# Patient Record
Sex: Male | Born: 1970
Health system: Southern US, Community
[De-identification: ages and names within clinical notes are randomized; demographics above are authoritative.]

## PROBLEM LIST (undated history)

## (undated) DIAGNOSIS — S0530XA Ocular laceration without prolapse or loss of intraocular tissue, unspecified eye, initial encounter: Secondary | ICD-10-CM

---

## 2016-12-21 ENCOUNTER — Inpatient Hospital Stay (HOSPITAL_COMMUNITY): Payer: Self-pay

## 2016-12-21 ENCOUNTER — Emergency Department (HOSPITAL_COMMUNITY): Payer: Self-pay

## 2016-12-21 ENCOUNTER — Inpatient Hospital Stay (HOSPITAL_COMMUNITY)
Admission: EM | Admit: 2016-12-21 | Discharge: 2017-01-12 | DRG: 004 | Disposition: A | Discharge status: Rehabilitation Facility | Payer: Self-pay | Attending: Internal Medicine | Admitting: Internal Medicine

## 2016-12-21 DIAGNOSIS — I469 Cardiac arrest, cause unspecified: Secondary | ICD-10-CM | POA: Diagnosis present

## 2016-12-21 DIAGNOSIS — E872 Acidosis: Secondary | ICD-10-CM | POA: Diagnosis present

## 2016-12-21 DIAGNOSIS — I5021 Acute systolic (congestive) heart failure: Secondary | ICD-10-CM

## 2016-12-21 DIAGNOSIS — R739 Hyperglycemia, unspecified: Secondary | ICD-10-CM

## 2016-12-21 DIAGNOSIS — X58XXXA Exposure to other specified factors, initial encounter: Secondary | ICD-10-CM | POA: Diagnosis present

## 2016-12-21 DIAGNOSIS — K7200 Acute and subacute hepatic failure without coma: Secondary | ICD-10-CM | POA: Diagnosis present

## 2016-12-21 DIAGNOSIS — F1010 Alcohol abuse, uncomplicated: Secondary | ICD-10-CM | POA: Diagnosis present

## 2016-12-21 DIAGNOSIS — I639 Cerebral infarction, unspecified: Secondary | ICD-10-CM | POA: Diagnosis not present

## 2016-12-21 DIAGNOSIS — Z6823 Body mass index (BMI) 23.0-23.9, adult: Secondary | ICD-10-CM

## 2016-12-21 DIAGNOSIS — K7290 Hepatic failure, unspecified without coma: Secondary | ICD-10-CM | POA: Diagnosis present

## 2016-12-21 DIAGNOSIS — E162 Hypoglycemia, unspecified: Secondary | ICD-10-CM | POA: Diagnosis present

## 2016-12-21 DIAGNOSIS — I96 Gangrene, not elsewhere classified: Secondary | ICD-10-CM | POA: Diagnosis not present

## 2016-12-21 DIAGNOSIS — K567 Ileus, unspecified: Secondary | ICD-10-CM

## 2016-12-21 DIAGNOSIS — I255 Ischemic cardiomyopathy: Secondary | ICD-10-CM

## 2016-12-21 DIAGNOSIS — J15212 Pneumonia due to Methicillin resistant Staphylococcus aureus: Secondary | ICD-10-CM | POA: Diagnosis present

## 2016-12-21 DIAGNOSIS — T17990A Other foreign object in respiratory tract, part unspecified in causing asphyxiation, initial encounter: Secondary | ICD-10-CM | POA: Diagnosis present

## 2016-12-21 DIAGNOSIS — R339 Retention of urine, unspecified: Secondary | ICD-10-CM | POA: Diagnosis not present

## 2016-12-21 DIAGNOSIS — J9600 Acute respiratory failure, unspecified whether with hypoxia or hypercapnia: Secondary | ICD-10-CM

## 2016-12-21 DIAGNOSIS — R1110 Vomiting, unspecified: Secondary | ICD-10-CM

## 2016-12-21 DIAGNOSIS — N170 Acute kidney failure with tubular necrosis: Secondary | ICD-10-CM | POA: Diagnosis present

## 2016-12-21 DIAGNOSIS — R509 Fever, unspecified: Secondary | ICD-10-CM

## 2016-12-21 DIAGNOSIS — T17928A Food in respiratory tract, part unspecified causing other injury, initial encounter: Secondary | ICD-10-CM

## 2016-12-21 DIAGNOSIS — B1920 Unspecified viral hepatitis C without hepatic coma: Secondary | ICD-10-CM | POA: Diagnosis present

## 2016-12-21 DIAGNOSIS — J9601 Acute respiratory failure with hypoxia: Secondary | ICD-10-CM

## 2016-12-21 DIAGNOSIS — J189 Pneumonia, unspecified organism: Secondary | ICD-10-CM

## 2016-12-21 DIAGNOSIS — Z4659 Encounter for fitting and adjustment of other gastrointestinal appliance and device: Secondary | ICD-10-CM

## 2016-12-21 DIAGNOSIS — G9340 Encephalopathy, unspecified: Secondary | ICD-10-CM

## 2016-12-21 DIAGNOSIS — R6521 Severe sepsis with septic shock: Secondary | ICD-10-CM | POA: Diagnosis present

## 2016-12-21 DIAGNOSIS — Z79899 Other long term (current) drug therapy: Secondary | ICD-10-CM

## 2016-12-21 DIAGNOSIS — A419 Sepsis, unspecified organism: Principal | ICD-10-CM | POA: Diagnosis present

## 2016-12-21 DIAGNOSIS — R0682 Tachypnea, not elsewhere classified: Secondary | ICD-10-CM

## 2016-12-21 DIAGNOSIS — W44F3XA Food entering into or through a natural orifice, initial encounter: Secondary | ICD-10-CM

## 2016-12-21 DIAGNOSIS — J9690 Respiratory failure, unspecified, unspecified whether with hypoxia or hypercapnia: Secondary | ICD-10-CM

## 2016-12-21 DIAGNOSIS — T17920A Food in respiratory tract, part unspecified causing asphyxiation, initial encounter: Secondary | ICD-10-CM

## 2016-12-21 DIAGNOSIS — Z0189 Encounter for other specified special examinations: Secondary | ICD-10-CM

## 2016-12-21 DIAGNOSIS — J1008 Influenza due to other identified influenza virus with other specified pneumonia: Secondary | ICD-10-CM | POA: Diagnosis present

## 2016-12-21 DIAGNOSIS — I428 Other cardiomyopathies: Secondary | ICD-10-CM | POA: Diagnosis present

## 2016-12-21 DIAGNOSIS — L8990 Pressure ulcer of unspecified site, unspecified stage: Secondary | ICD-10-CM | POA: Insufficient documentation

## 2016-12-21 DIAGNOSIS — G931 Anoxic brain damage, not elsewhere classified: Secondary | ICD-10-CM

## 2016-12-21 DIAGNOSIS — Z781 Physical restraint status: Secondary | ICD-10-CM

## 2016-12-21 DIAGNOSIS — Z66 Do not resuscitate: Secondary | ICD-10-CM | POA: Diagnosis not present

## 2016-12-21 DIAGNOSIS — R451 Restlessness and agitation: Secondary | ICD-10-CM

## 2016-12-21 DIAGNOSIS — Z452 Encounter for adjustment and management of vascular access device: Secondary | ICD-10-CM

## 2016-12-21 DIAGNOSIS — D72829 Elevated white blood cell count, unspecified: Secondary | ICD-10-CM

## 2016-12-21 DIAGNOSIS — K56609 Unspecified intestinal obstruction, unspecified as to partial versus complete obstruction: Secondary | ICD-10-CM

## 2016-12-21 DIAGNOSIS — R6510 Systemic inflammatory response syndrome (SIRS) of non-infectious origin without acute organ dysfunction: Secondary | ICD-10-CM

## 2016-12-21 DIAGNOSIS — E870 Hyperosmolality and hypernatremia: Secondary | ICD-10-CM | POA: Diagnosis not present

## 2016-12-21 DIAGNOSIS — J80 Acute respiratory distress syndrome: Secondary | ICD-10-CM | POA: Diagnosis present

## 2016-12-21 DIAGNOSIS — E46 Unspecified protein-calorie malnutrition: Secondary | ICD-10-CM | POA: Diagnosis present

## 2016-12-21 DIAGNOSIS — F1910 Other psychoactive substance abuse, uncomplicated: Secondary | ICD-10-CM

## 2016-12-21 DIAGNOSIS — D6489 Other specified anemias: Secondary | ICD-10-CM | POA: Diagnosis present

## 2016-12-21 DIAGNOSIS — R4020 Unspecified coma: Secondary | ICD-10-CM | POA: Diagnosis not present

## 2016-12-21 DIAGNOSIS — Z9911 Dependence on respirator [ventilator] status: Secondary | ICD-10-CM

## 2016-12-21 DIAGNOSIS — J9602 Acute respiratory failure with hypercapnia: Secondary | ICD-10-CM

## 2016-12-21 DIAGNOSIS — E876 Hypokalemia: Secondary | ICD-10-CM | POA: Diagnosis present

## 2016-12-21 DIAGNOSIS — D6959 Other secondary thrombocytopenia: Secondary | ICD-10-CM | POA: Diagnosis present

## 2016-12-21 DIAGNOSIS — R233 Spontaneous ecchymoses: Secondary | ICD-10-CM | POA: Diagnosis not present

## 2016-12-21 HISTORY — DX: Ocular laceration without prolapse or loss of intraocular tissue, unspecified eye, initial encounter: S05.30XA

## 2016-12-21 LAB — COMPREHENSIVE METABOLIC PANEL
ALT: 282 U/L — ABNORMAL HIGH (ref 17–63)
ANION GAP: 20 — AB (ref 5–15)
AST: 324 U/L — AB (ref 15–41)
Albumin: 3 g/dL — ABNORMAL LOW (ref 3.5–5.0)
Alkaline Phosphatase: 46 U/L (ref 38–126)
BUN: 16 mg/dL (ref 6–20)
CHLORIDE: 100 mmol/L — AB (ref 101–111)
CO2: 21 mmol/L — ABNORMAL LOW (ref 22–32)
Calcium: 7.9 mg/dL — ABNORMAL LOW (ref 8.9–10.3)
Creatinine, Ser: 2.44 mg/dL — ABNORMAL HIGH (ref 0.61–1.24)
GFR, EST AFRICAN AMERICAN: 35 mL/min — AB (ref >60)
GFR, EST NON AFRICAN AMERICAN: 30 mL/min — AB (ref >60)
Glucose, Bld: 115 mg/dL — ABNORMAL HIGH (ref 65–99)
POTASSIUM: 3.8 mmol/L (ref 3.5–5.1)
Sodium: 141 mmol/L (ref 135–145)
Total Bilirubin: 0.4 mg/dL (ref 0.3–1.2)
Total Protein: 5.9 g/dL — ABNORMAL LOW (ref 6.5–8.1)

## 2016-12-21 LAB — ECHOCARDIOGRAM COMPLETE: WEIGHTICAEL: 2973.56 [oz_av]

## 2016-12-21 LAB — I-STAT CHEM 8, ED
BUN: 19 mg/dL (ref 6–20)
CHLORIDE: 100 mmol/L — AB (ref 101–111)
Calcium, Ion: 0.96 mmol/L — ABNORMAL LOW (ref 1.15–1.40)
Creatinine, Ser: 2.1 mg/dL — ABNORMAL HIGH (ref 0.61–1.24)
Glucose, Bld: 110 mg/dL — ABNORMAL HIGH (ref 65–99)
HEMATOCRIT: 53 % — AB (ref 39.0–52.0)
Hemoglobin: 18 g/dL — ABNORMAL HIGH (ref 13.0–17.0)
POTASSIUM: 3.8 mmol/L (ref 3.5–5.1)
SODIUM: 141 mmol/L (ref 135–145)
TCO2: 22 mmol/L (ref 0–100)

## 2016-12-21 LAB — URINALYSIS, ROUTINE W REFLEX MICROSCOPIC
Bilirubin Urine: NEGATIVE
Glucose, UA: NEGATIVE mg/dL
Hgb urine dipstick: NEGATIVE
KETONES UR: NEGATIVE mg/dL
Leukocytes, UA: NEGATIVE
Nitrite: NEGATIVE
PH: 5 (ref 5.0–8.0)
Protein, ur: 100 mg/dL — AB
Specific Gravity, Urine: 1.02 (ref 1.005–1.030)

## 2016-12-21 LAB — ETHANOL

## 2016-12-21 LAB — BASIC METABOLIC PANEL
Anion gap: 9 (ref 5–15)
Anion gap: 12 (ref 5–15)
BUN: 21 mg/dL — ABNORMAL HIGH (ref 6–20)
BUN: 27 mg/dL — ABNORMAL HIGH (ref 6–20)
CALCIUM: 6.9 mg/dL — AB (ref 8.9–10.3)
CALCIUM: 6.6 mg/dL — AB (ref 8.9–10.3)
CHLORIDE: 107 mmol/L (ref 101–111)
CHLORIDE: 108 mmol/L (ref 101–111)
CO2: 24 mmol/L (ref 22–32)
CO2: 17 mmol/L — ABNORMAL LOW (ref 22–32)
CREATININE: 2.27 mg/dL — AB (ref 0.61–1.24)
CREATININE: 1.98 mg/dL — AB (ref 0.61–1.24)
GFR calc non Af Amer: 33 mL/min — ABNORMAL LOW (ref >60)
GFR, EST AFRICAN AMERICAN: 38 mL/min — AB (ref >60)
GFR, EST AFRICAN AMERICAN: 45 mL/min — AB (ref >60)
GFR, EST NON AFRICAN AMERICAN: 39 mL/min — AB (ref >60)
Glucose, Bld: 88 mg/dL (ref 65–99)
Glucose, Bld: 152 mg/dL — ABNORMAL HIGH (ref 65–99)
Potassium: 4.1 mmol/L (ref 3.5–5.1)
Potassium: 3.8 mmol/L (ref 3.5–5.1)
SODIUM: 140 mmol/L (ref 135–145)
SODIUM: 137 mmol/L (ref 135–145)

## 2016-12-21 LAB — RESPIRATORY PANEL BY PCR
ADENOVIRUS-RVPPCR: NOT DETECTED
Bordetella pertussis: NOT DETECTED
CHLAMYDOPHILA PNEUMONIAE-RVPPCR: NOT DETECTED
CORONAVIRUS HKU1-RVPPCR: NOT DETECTED
CORONAVIRUS OC43-RVPPCR: NOT DETECTED
Coronavirus 229E: NOT DETECTED
Coronavirus NL63: NOT DETECTED
INFLUENZA B-RVPPCR: DETECTED — AB
Influenza A: NOT DETECTED
METAPNEUMOVIRUS-RVPPCR: NOT DETECTED
MYCOPLASMA PNEUMONIAE-RVPPCR: NOT DETECTED
Parainfluenza Virus 1: NOT DETECTED
Parainfluenza Virus 2: NOT DETECTED
Parainfluenza Virus 3: NOT DETECTED
Parainfluenza Virus 4: NOT DETECTED
RHINOVIRUS / ENTEROVIRUS - RVPPCR: NOT DETECTED
Respiratory Syncytial Virus: NOT DETECTED

## 2016-12-21 LAB — I-STAT ARTERIAL BLOOD GAS, ED
ACID-BASE DEFICIT: 7 mmol/L — AB (ref 0.0–2.0)
BICARBONATE: 20.5 mmol/L (ref 20.0–28.0)
O2 Saturation: 91 %
PCO2 ART: 48.4 mmHg — AB (ref 32.0–48.0)
PO2 ART: 72 mmHg — AB (ref 83.0–108.0)
Patient temperature: 98
TCO2: 22 mmol/L (ref 0–100)
pH, Arterial: 7.233 — ABNORMAL LOW (ref 7.350–7.450)

## 2016-12-21 LAB — POCT I-STAT 3, ART BLOOD GAS (G3+)
ACID-BASE DEFICIT: 10 mmol/L — AB (ref 0.0–2.0)
Acid-base deficit: 10 mmol/L — ABNORMAL HIGH (ref 0.0–2.0)
Bicarbonate: 19 mmol/L — ABNORMAL LOW (ref 20.0–28.0)
Bicarbonate: 18.9 mmol/L — ABNORMAL LOW (ref 20.0–28.0)
O2 SAT: 91 %
O2 Saturation: 87 %
PCO2 ART: 50.5 mmHg — AB (ref 32.0–48.0)
PH ART: 7.185 — AB (ref 7.350–7.450)
PH ART: 7.193 — AB (ref 7.350–7.450)
TCO2: 21 mmol/L (ref 0–100)
TCO2: 20 mmol/L (ref 0–100)
pCO2 arterial: 48.4 mmHg — ABNORMAL HIGH (ref 32.0–48.0)
pO2, Arterial: 66 mmHg — ABNORMAL LOW (ref 83.0–108.0)
pO2, Arterial: 71 mmHg — ABNORMAL LOW (ref 83.0–108.0)

## 2016-12-21 LAB — CBC WITH DIFFERENTIAL/PLATELET
Basophils Absolute: 0 10*3/uL (ref 0.0–0.1)
Basophils Relative: 0 %
Eosinophils Absolute: 0 10*3/uL (ref 0.0–0.7)
Eosinophils Relative: 1 %
HCT: 51.9 % (ref 39.0–52.0)
Hemoglobin: 16.2 g/dL (ref 13.0–17.0)
LYMPHS ABS: 0.4 10*3/uL — AB (ref 0.7–4.0)
Lymphocytes Relative: 30 %
MCH: 28.3 pg (ref 26.0–34.0)
MCHC: 31.2 g/dL (ref 30.0–36.0)
MCV: 90.7 fL (ref 78.0–100.0)
MONOS PCT: 6 %
Monocytes Absolute: 0.1 10*3/uL (ref 0.1–1.0)
NEUTROS ABS: 0.8 10*3/uL — AB (ref 1.7–7.7)
Neutrophils Relative %: 63 %
PLATELETS: 98 10*3/uL — AB (ref 150–400)
RBC: 5.72 MIL/uL (ref 4.22–5.81)
RDW: 13.3 % (ref 11.5–15.5)
WBC: 1.3 10*3/uL — CL (ref 4.0–10.5)

## 2016-12-21 LAB — I-STAT TROPONIN, ED: TROPONIN I, POC: 0.17 ng/mL — AB (ref 0.00–0.08)

## 2016-12-21 LAB — AMMONIA: Ammonia: 57 umol/L — ABNORMAL HIGH (ref 9–35)

## 2016-12-21 LAB — SALICYLATE LEVEL: SALICYLATE LVL: 8.2 mg/dL (ref 2.8–30.0)

## 2016-12-21 LAB — CBC
HCT: 48.3 % (ref 39.0–52.0)
Hemoglobin: 15.2 g/dL (ref 13.0–17.0)
MCH: 27.7 pg (ref 26.0–34.0)
MCHC: 31.5 g/dL (ref 30.0–36.0)
MCV: 88.1 fL (ref 78.0–100.0)
PLATELETS: 65 10*3/uL — AB (ref 150–400)
RBC: 5.48 MIL/uL (ref 4.22–5.81)
RDW: 13.1 % (ref 11.5–15.5)
WBC: 2 10*3/uL — ABNORMAL LOW (ref 4.0–10.5)

## 2016-12-21 LAB — LACTIC ACID, PLASMA
LACTIC ACID, VENOUS: 8.4 mmol/L — AB (ref 0.5–1.9)
Lactic Acid, Venous: 3.8 mmol/L (ref 0.5–1.9)

## 2016-12-21 LAB — APTT
APTT: 45 s — AB (ref 24–36)
aPTT: 38 seconds — ABNORMAL HIGH (ref 24–36)

## 2016-12-21 LAB — PROTIME-INR
INR: 1.6
INR: 1.53
PROTHROMBIN TIME: 19.2 s — AB (ref 11.4–15.2)
Prothrombin Time: 18.6 seconds — ABNORMAL HIGH (ref 11.4–15.2)

## 2016-12-21 LAB — I-STAT CG4 LACTIC ACID, ED: Lactic Acid, Venous: 9.62 mmol/L (ref 0.5–1.9)

## 2016-12-21 LAB — RAPID URINE DRUG SCREEN, HOSP PERFORMED
Amphetamines: NOT DETECTED
BENZODIAZEPINES: POSITIVE — AB
Barbiturates: NOT DETECTED
COCAINE: NOT DETECTED
OPIATES: POSITIVE — AB
Tetrahydrocannabinol: POSITIVE — AB

## 2016-12-21 LAB — PATHOLOGIST SMEAR REVIEW

## 2016-12-21 LAB — LIPASE, BLOOD: LIPASE: 38 U/L (ref 11–51)

## 2016-12-21 LAB — BRAIN NATRIURETIC PEPTIDE: B Natriuretic Peptide: 57.9 pg/mL (ref 0.0–100.0)

## 2016-12-21 LAB — GLUCOSE, CAPILLARY
GLUCOSE-CAPILLARY: 198 mg/dL — AB (ref 65–99)
GLUCOSE-CAPILLARY: 111 mg/dL — AB (ref 65–99)
Glucose-Capillary: 47 mg/dL — ABNORMAL LOW (ref 65–99)
Glucose-Capillary: 134 mg/dL — ABNORMAL HIGH (ref 65–99)

## 2016-12-21 LAB — TROPONIN I
TROPONIN I: 0.22 ng/mL — AB (ref <0.03)
Troponin I: 0.21 ng/mL (ref <0.03)
Troponin I: 0.18 ng/mL (ref <0.03)

## 2016-12-21 LAB — MRSA PCR SCREENING: MRSA by PCR: NEGATIVE

## 2016-12-21 LAB — ACETAMINOPHEN LEVEL

## 2016-12-21 LAB — PROCALCITONIN: Procalcitonin: 49.6 ng/mL

## 2016-12-21 LAB — CORTISOL: CORTISOL PLASMA: 40.8 ug/dL

## 2016-12-21 MED ORDER — FENTANYL BOLUS VIA INFUSION
50.0000 ug | INTRAVENOUS | Status: DC | PRN
  Filled 2016-12-21: qty 50

## 2016-12-21 MED ORDER — SODIUM CHLORIDE 0.9 % IV BOLUS (SEPSIS)
1000.0000 mL | Freq: Once | INTRAVENOUS | Status: AC
  Administered 2016-12-21: 1000 mL via INTRAVENOUS

## 2016-12-21 MED ORDER — ASPIRIN 300 MG RE SUPP
300.0000 mg | RECTAL | Status: AC
  Administered 2016-12-21: 300 mg via RECTAL
  Filled 2016-12-21: qty 1

## 2016-12-21 MED ORDER — SODIUM CHLORIDE 0.9 % IV SOLN: 100.0000 ug/h | INTRAVENOUS | Status: DC

## 2016-12-21 MED ORDER — VECURONIUM BROMIDE 10 MG IV SOLR
10.0000 mg | INTRAVENOUS | Status: DC | PRN
  Administered 2016-12-21: 10 mg via INTRAVENOUS
  Filled 2016-12-21: qty 10

## 2016-12-21 MED ORDER — THIAMINE HCL 100 MG/ML IJ SOLN
100.0000 mg | Freq: Every day | INTRAMUSCULAR | Status: AC
  Administered 2016-12-21 – 2016-12-24 (×4): 100 mg via INTRAVENOUS
  Filled 2016-12-21 (×4): qty 2

## 2016-12-21 MED ORDER — DEXTROSE 50 % IV SOLN
INTRAVENOUS | Status: AC
  Filled 2016-12-21: qty 50

## 2016-12-21 MED ORDER — SUCCINYLCHOLINE CHLORIDE 20 MG/ML IJ SOLN
INTRAMUSCULAR | Status: AC | PRN
  Administered 2016-12-21: 150 mg via INTRAVENOUS

## 2016-12-21 MED ORDER — NOREPINEPHRINE BITARTRATE 1 MG/ML IV SOLN
0.0000 ug/min | INTRAVENOUS | Status: DC
  Administered 2016-12-21: 10 ug/min via INTRAVENOUS
  Administered 2016-12-21: 21 ug/min via INTRAVENOUS
  Filled 2016-12-21 (×2): qty 4

## 2016-12-21 MED ORDER — VANCOMYCIN HCL 10 G IV SOLR
1250.0000 mg | INTRAVENOUS | Status: DC
  Administered 2016-12-22 – 2016-12-23 (×2): 1250 mg via INTRAVENOUS
  Filled 2016-12-21 (×2): qty 1250

## 2016-12-21 MED ORDER — FOLIC ACID 5 MG/ML IJ SOLN
1.0000 mg | Freq: Every day | INTRAMUSCULAR | Status: AC
  Administered 2016-12-22 – 2016-12-24 (×3): 1 mg via INTRAVENOUS
  Filled 2016-12-21 (×4): qty 0.2

## 2016-12-21 MED ORDER — DEXTROSE 50 % IV SOLN
1.0000 | Freq: Once | INTRAVENOUS | Status: AC
  Administered 2016-12-21: 50 mL via INTRAVENOUS

## 2016-12-21 MED ORDER — MIDAZOLAM HCL 2 MG/2ML IJ SOLN
1.0000 mg | INTRAMUSCULAR | Status: DC | PRN
  Filled 2016-12-21: qty 2

## 2016-12-21 MED ORDER — SODIUM CHLORIDE 0.9 % IV SOLN: INTRAVENOUS | Status: DC

## 2016-12-21 MED ORDER — PROPOFOL 1000 MG/100ML IV EMUL: 25.0000 ug/kg/min | INTRAVENOUS | Status: DC

## 2016-12-21 MED ORDER — SODIUM CHLORIDE 0.9 % IV SOLN
25.0000 ug/h | INTRAVENOUS | Status: DC
  Administered 2016-12-21: 300 ug/h via INTRAVENOUS
  Administered 2016-12-22: 200 ug/h via INTRAVENOUS
  Administered 2016-12-22: 400 ug/h via INTRAVENOUS
  Filled 2016-12-21 (×3): qty 50

## 2016-12-21 MED ORDER — MIDAZOLAM BOLUS VIA INFUSION
4.0000 mg | Freq: Once | INTRAVENOUS | Status: AC
  Administered 2016-12-21: 4 mg via INTRAVENOUS
  Filled 2016-12-21: qty 4

## 2016-12-21 MED ORDER — DEXTROSE-NACL 5-0.9 % IV SOLN
INTRAVENOUS | Status: DC
  Administered 2016-12-21 (×2): via INTRAVENOUS

## 2016-12-21 MED ORDER — SODIUM CHLORIDE 0.9 % IV SOLN
10.0000 ug/h | INTRAVENOUS | Status: DC
  Administered 2016-12-21: 10 ug/h via INTRAVENOUS
  Filled 2016-12-21: qty 50

## 2016-12-21 MED ORDER — ORAL CARE MOUTH RINSE
15.0000 mL | OROMUCOSAL | Status: DC
  Administered 2016-12-21 – 2017-01-08 (×157): 15 mL via OROMUCOSAL

## 2016-12-21 MED ORDER — PANTOPRAZOLE SODIUM 40 MG IV SOLR
40.0000 mg | Freq: Every day | INTRAVENOUS | Status: DC
  Administered 2016-12-21 – 2016-12-28 (×8): 40 mg via INTRAVENOUS
  Filled 2016-12-21 (×8): qty 40

## 2016-12-21 MED ORDER — DEXTROSE 5 % IV SOLN
2.0000 g | INTRAVENOUS | Status: DC
  Administered 2016-12-21 – 2016-12-22 (×2): 2 g via INTRAVENOUS
  Filled 2016-12-21 (×2): qty 2

## 2016-12-21 MED ORDER — MIDAZOLAM HCL 2 MG/2ML IJ SOLN
INTRAMUSCULAR | Status: AC
  Filled 2016-12-21: qty 4

## 2016-12-21 MED ORDER — FENTANYL BOLUS VIA INFUSION
25.0000 ug | INTRAVENOUS | Status: DC | PRN
  Filled 2016-12-21: qty 25

## 2016-12-21 MED ORDER — OSELTAMIVIR PHOSPHATE 6 MG/ML PO SUSR
75.0000 mg | Freq: Every day | ORAL | Status: DC
  Administered 2016-12-21 – 2016-12-22 (×2): 75 mg
  Filled 2016-12-21 (×3): qty 12.5

## 2016-12-21 MED ORDER — CISATRACURIUM BOLUS VIA INFUSION
0.0500 mg/kg | INTRAVENOUS | Status: DC | PRN
  Filled 2016-12-21: qty 5

## 2016-12-21 MED ORDER — MIDAZOLAM HCL 5 MG/ML IJ SOLN
0.0000 mg/h | INTRAMUSCULAR | Status: DC
  Administered 2016-12-21: 2 mg/h via INTRAVENOUS
  Administered 2016-12-22: 6 mg/h via INTRAVENOUS
  Filled 2016-12-21 (×2): qty 20

## 2016-12-21 MED ORDER — ETOMIDATE 2 MG/ML IV SOLN
INTRAVENOUS | Status: AC | PRN
  Administered 2016-12-21: 20 mg via INTRAVENOUS

## 2016-12-21 MED ORDER — VANCOMYCIN HCL IN DEXTROSE 1-5 GM/200ML-% IV SOLN
1000.0000 mg | Freq: Once | INTRAVENOUS | Status: AC
  Administered 2016-12-21: 1000 mg via INTRAVENOUS
  Filled 2016-12-21: qty 200

## 2016-12-21 MED ORDER — MIDAZOLAM HCL 2 MG/2ML IJ SOLN
INTRAMUSCULAR | Status: AC
Start: 2016-12-21 — End: 2016-12-21
  Administered 2016-12-21: 2 mg
  Filled 2016-12-21: qty 4

## 2016-12-21 MED ORDER — CHLORHEXIDINE GLUCONATE 0.12% ORAL RINSE (MEDLINE KIT)
15.0000 mL | Freq: Two times a day (BID) | OROMUCOSAL | Status: DC
  Administered 2016-12-21 – 2017-01-11 (×38): 15 mL via OROMUCOSAL

## 2016-12-21 MED ORDER — NOREPINEPHRINE BITARTRATE 1 MG/ML IV SOLN
0.0000 ug/min | INTRAVENOUS | Status: DC
  Administered 2016-12-21: 18 ug/min via INTRAVENOUS
  Filled 2016-12-21 (×4): qty 16

## 2016-12-21 MED ORDER — VECURONIUM BROMIDE 10 MG IV SOLR
INTRAVENOUS | Status: AC
  Administered 2016-12-21: 10 mg
  Filled 2016-12-21: qty 10

## 2016-12-21 MED ORDER — ASPIRIN 300 MG RE SUPP: 300.0000 mg | RECTAL | Status: DC

## 2016-12-21 MED ORDER — FENTANYL CITRATE (PF) 100 MCG/2ML IJ SOLN: 50.0000 ug | Freq: Once | INTRAMUSCULAR | Status: DC

## 2016-12-21 MED ORDER — MIDAZOLAM HCL 2 MG/2ML IJ SOLN
1.0000 mg | INTRAMUSCULAR | Status: DC | PRN
  Administered 2016-12-21: 1 mg via INTRAVENOUS
  Filled 2016-12-21: qty 2

## 2016-12-21 MED ORDER — PIPERACILLIN-TAZOBACTAM 3.375 G IVPB: 3.3750 g | Freq: Three times a day (TID) | INTRAVENOUS | Status: DC

## 2016-12-21 MED ORDER — MIDAZOLAM HCL 2 MG/2ML IJ SOLN
INTRAMUSCULAR | Status: AC
  Administered 2016-12-21: 4 mg via INTRAVENOUS
  Filled 2016-12-21: qty 4

## 2016-12-21 MED ORDER — CISATRACURIUM BOLUS VIA INFUSION
0.1000 mg/kg | Freq: Once | INTRAVENOUS | Status: DC
  Filled 2016-12-21: qty 10

## 2016-12-21 MED ORDER — ARTIFICIAL TEARS OP OINT: 1.0000 "application " | TOPICAL_OINTMENT | Freq: Three times a day (TID) | OPHTHALMIC | Status: DC

## 2016-12-21 MED ORDER — SODIUM CHLORIDE 0.9 % IV BOLUS (SEPSIS)
500.0000 mL | Freq: Once | INTRAVENOUS | Status: AC
Start: 2016-12-21 — End: 2016-12-21
  Administered 2016-12-21: 500 mL via INTRAVENOUS

## 2016-12-21 MED ORDER — PANTOPRAZOLE SODIUM 40 MG IV SOLR: 40.0000 mg | Freq: Every day | INTRAVENOUS | Status: DC

## 2016-12-21 MED ORDER — PIPERACILLIN-TAZOBACTAM 3.375 G IVPB 30 MIN
3.3750 g | Freq: Once | INTRAVENOUS | Status: AC
  Administered 2016-12-21: 3.375 g via INTRAVENOUS
  Filled 2016-12-21: qty 50

## 2016-12-21 MED ORDER — NOREPINEPHRINE BITARTRATE 1 MG/ML IV SOLN
0.0000 ug/min | INTRAVENOUS | Status: DC
  Filled 2016-12-21: qty 4

## 2016-12-21 MED ORDER — MIDAZOLAM HCL 2 MG/2ML IJ SOLN
4.0000 mg | INTRAMUSCULAR | Status: DC | PRN
  Administered 2016-12-21 (×2): 4 mg via INTRAVENOUS

## 2016-12-21 MED ORDER — NOREPINEPHRINE BITARTRATE 1 MG/ML IV SOLN
0.0000 ug/min | Freq: Once | INTRAVENOUS | Status: AC
  Administered 2016-12-21: 5 ug/min via INTRAVENOUS
  Filled 2016-12-21: qty 4

## 2016-12-21 MED ORDER — SODIUM CHLORIDE 0.9 % IV SOLN
1.0000 ug/kg/min | INTRAVENOUS | Status: DC
  Filled 2016-12-21: qty 20

## 2016-12-21 MED ORDER — PERFLUTREN LIPID MICROSPHERE
INTRAVENOUS | Status: AC
  Administered 2016-12-21: 2 mL
  Filled 2016-12-21: qty 10

## 2016-12-21 MED ORDER — FENTANYL CITRATE (PF) 100 MCG/2ML IJ SOLN
INTRAMUSCULAR | Status: AC
  Filled 2016-12-21: qty 2

## 2016-12-21 NOTE — Progress Notes (Signed)
Pt was brought in by ems unresponsive found by girlfriend EMS brought back came in on NRB mask DR intubated with 7.5 tube .Marland Kitchen Aline placed initial ABG drawn off aline and reported to MD.

## 2016-12-21 NOTE — Progress Notes (Signed)
   12/21/16 0615  Clinical Encounter Type  Visited With Patient and family together  Visit Type ED  Spiritual Encounters  Spiritual Needs Emotional  Stress Factors  Patient Stress Factors Not reviewed  Family Stress Factors Family relationships  Paged that family in consult B. Went with MD when he explained Pt's condition. Pt undergoing procedures and family will be allowed back when he stabilizes or awaiting permission from MD.

## 2016-12-21 NOTE — Procedures (Signed)
ELECTROENCEPHALOGRAM REPORT  Date of Study: 12/21/2016  Patient's Name: Troy Brennan MRN: [id number] Date of Birth: 09-Jul-1971  Referring Provider: Dr. Merrie Roof  Clinical History: This is a 46 year old man with encephalopathy, s/p CPR  Medications: vecuronium (NORCURON) 10 MG injection  cefTRIAXone (ROCEPHIN) 2 g in dextrose 5 % 50 mL IVPB  chlorhexidine gluconate (MEDLINE KIT) (PERIDEX) 0.12 % solution 15 mL  dextrose 5 %-0.9 % sodium chloride infusion  fentaNYL (SUBLIMAZE) 100 MCG/2ML injection  fentaNYL (SUBLIMAZE) 2,500 mcg in sodium chloride 0.9 % 250 mL (10 mcg/mL) infusion  fentaNYL (SUBLIMAZE) bolus via infusion 50 mcg  fentaNYL (SUBLIMAZE) injection 50 mcg  folic acid injection 1 mg  MEDLINE mouth rinse  midazolam (VERSED) 100 mg in sodium chloride 0.9 % 100 mL (1 mg/mL) infusion  midazolam (VERSED) 2 MG/2ML injection  norepinephrine (LEVOPHED) 4 mg in dextrose 5 % 250 mL (0.016 mg/mL) infusion  oseltamivir (TAMIFLU) 6 MG/ML suspension 75 mg  pantoprazole (PROTONIX) injection 40 mg  thiamine (B-1) injection 100 mg  vancomycin (VANCOCIN) 1,250 mg in sodium chloride 0.9 % 250 mL IVPB  vecuronium (NORCURON) injection 10 mg   Technical Summary: A multichannel digital EEG recording measured by the international 10-20 system with electrodes applied with paste and impedances below 5000 ohms performed in our laboratory with EKG monitoring in an intubated and sedated patient.  Hyperventilation and photic stimulation were not performed.  The digital EEG was referentially recorded, reformatted, and digitally filtered in a variety of bipolar and referential montages for optimal display.    Description: The patient is intubated and sedated on Fentanyl and Versed during the recording. The study is obscured by muscle artifact from shivering for the first 10 minutes until vecuronium is given. There is no clear posterior dominant rhythm. The background consists of a  mixture of beta activity with theta and delta slowing. There was no focal slowing seen. No epileptiform discharges or electrographic seizures seen.    EKG lead showed sinus tachycardia.  Impression: This sedated EEG is abnormal due to diffuse slowing of the background.  Clinical Correlation of the above findings indicates diffuse cerebral dysfunction that is nonspecific in etiology and can be seen with hypoxic/ischemic injury, toxic/metabolic encephalopathy, or medication effect from Fentanyl and Versed. There were no electrographic seizures in this study.    Ellouise Newer, M.D.

## 2016-12-21 NOTE — Procedures (Signed)
Central Venous Catheter Insertion Procedure Note Troy Brennan [id number] 1971-08-01  Procedure: Insertion of Central Venous Catheter Indications: Assessment of intravascular volume, Drug and/or fluid administration and Frequent blood sampling  Procedure Details Consent: Unable to obtain consent because of emergent medical necessity. Time Out: Verified patient identification, verified procedure, site/side was marked, verified correct patient position, special equipment/implants available, medications/allergies/relevent history reviewed, required imaging and test results available.  Performed  Maximum sterile technique was used including antiseptics, cap, gloves, gown, hand hygiene, mask and sheet. Skin prep: Chlorhexidine; local anesthetic administered A antimicrobial bonded/coated triple lumen catheter was placed in the left internal jugular vein using the Seldinger technique.  Evaluation Blood flow good Complications: No apparent complications Patient did tolerate procedure well. Chest X-ray ordered to verify placement.  CXR: pending.  Troy Brennan 12/21/2016, 9:34 AM  Note failed attempts by KE NP prior Troy Brennan. Troy Alias, MD, FACP Pgr: [phone] [facility] Troy Brennan

## 2016-12-21 NOTE — Progress Notes (Addendum)
Pharmacy Antibiotic Note  Koray Zappone is a 46 y.o. male admitted on 12/21/2016 with sepsis.  Pharmacy has been consulted for vancomycin and zosyn dosing. Tmax is 100.2 and WBC is low at 1.3 SCr is elevated at 2.44. First doses per EDP.   Plan: Vanc 1250mg  IV Q24H Zosyn 3.375gm IV Q8H (4hr inf) F/u renal fxn, C&S, clinical status and trough at SS  Weight: 221 lb 5.5 oz (100.4 kg)  Temp (24hrs), Avg:100.1 F (37.8 C), Min:100 F (37.8 C), Max:100.2 F (37.9 C)   Recent Labs Lab 12/21/16 0539 12/21/16 0540 12/21/16 0557 12/21/16 0616  WBC  --   --  1.3*  --   CREATININE 2.10*  --  2.44*  --   LATICACIDVEN  --  9.62*  --  8.4*    CrCl cannot be calculated (Unknown ideal weight.).    No Known Allergies  Antimicrobials this admission: Vanc 2/14>> Zosyn 2/14>>  Dose adjustments this admission: N/A  Microbiology results: Pending  Thank you for allowing pharmacy to be a part of this patient's care.  Peter Daquila, Drake Leach 12/21/2016 7:39 AM  Addendum: Changing zosyn to ceftriaxone  Plan: Ceftriaxone 2gm IV Q24H  Lysle Pearl, PharmD, BCPS Pager # [phone] 12/21/2016 7:54 AM

## 2016-12-21 NOTE — ED Notes (Signed)
Per chest xray from 6:20 this morning, OG tube noted to be in right lung, OG tube removed at this time.

## 2016-12-21 NOTE — Progress Notes (Signed)
  Echocardiogram 2D Echocardiogram with Definity has been performed.  Troy Brennan 12/21/2016, 2:20 PM

## 2016-12-21 NOTE — Procedures (Signed)
Arterial Catheter Insertion Procedure Note Troy Brennan [id number] 11/07/1875  Procedure: Insertion of Arterial Catheter  Indications: Blood pressure monitoring  Procedure Details Consent: Unable to obtain consent because of emergent medical necessity. Time Out: Verified patient identification, verified procedure, site/side was marked, verified correct patient position, special equipment/implants available, medications/allergies/relevent history reviewed, required imaging and test results available.  Performed  Maximum sterile technique was used including antiseptics, cap, gloves, gown, hand hygiene, mask and sheet. Skin prep: Chlorhexidine; local anesthetic administered 20 gauge catheter was inserted into right radial artery using the Seldinger technique.  Evaluation Blood flow good; BP tracing good. Complications: No apparent complications.   Troy Brennan [facility] 12/21/2016

## 2016-12-21 NOTE — Care Management Note (Signed)
Case Management Note  Patient Details  Name: Troy Brennan MRN: [id number] Date of Birth: Dec 09, 1970  Subjective/Objective:     Adm w unresponsiveness, vent             Action/Plan: found by girlfirend, sister has been contacted per chart   Expected Discharge Date:                  Expected Discharge Plan:     In-House Referral:     Discharge planning Services  CM Consult  Post Acute Care Choice:    Choice offered to:     DME Arranged:    DME Agency:     HH Arranged:    HH Agency:     Status of Service:  In process, will continue to follow  If discussed at Long Length of Stay Meetings, dates discussed:    Additional Comments: will moniter as pt progresses.  Hanley Hays, RN 12/21/2016, 10:25 AM

## 2016-12-21 NOTE — Progress Notes (Signed)
Bedside EEG completed, results pending. 

## 2016-12-21 NOTE — ED Provider Notes (Signed)
[facility]-EMERGENCY DEPT Provider Note   CSN: [id number] Arrival date & time: 12/21/16  0522     History   Chief Complaint Chief Complaint  Patient presents with  . Cardiac Arrest    HPI Troy Brennan is a 46 y.o. male.  Patient presents to the emergency department for evaluation of unresponsiveness and possible cardiac arrest. Patient reportedly was heard to be struggling with his breathing by his fiance. She reports that she was unable to find pulses and started CPR. First responders continued CPR, by the time EMS arrived on the scene, patient had regained pulses. He has been in sinus tachycardia, struggling with his breathing throughout transportation. He was administered Narcan without any significant improvement.  Level V Caveat due to acuity      No past medical history on file.  There are no active problems to display for this patient.   No past surgical history on file.     Home Medications    Prior to Admission medications   Not on File    Family History No family history on file.  Social History Social History  Substance Use Topics  . Smoking status: Not on file  . Smokeless tobacco: Not on file  . Alcohol use Not on file     Allergies   Patient has no known allergies.   Review of Systems Review of Systems  Unable to perform ROS: Acuity of condition     Physical Exam Updated Vital Signs BP (!) 85/58   Pulse (!) 57   Temp 100.2 F (37.9 C) (Rectal)   Resp 26   Wt 221 lb 5.5 oz (100.4 kg)   SpO2 (!) 85%   Physical Exam  Constitutional: He appears well-developed. He appears lethargic.  HENT:  Head: Atraumatic.  Eyes:  Left eye chronic post-op changes R pupil small, sluggish  Neck: Neck supple.  Cardiovascular: Regular rhythm.  Tachycardia present.   Pulmonary/Chest: Accessory muscle usage present. Tachypnea noted. He is in respiratory distress. He has decreased breath sounds. He has rhonchi. He has rales.  Abdominal: Soft.   Musculoskeletal: He exhibits no edema.  Neurological: He appears lethargic. GCS eye subscore is 4. GCS verbal subscore is 1. GCS motor subscore is 4.  Skin: Skin is warm and dry.     ED Treatments / Results  Labs (all labs ordered are listed, but only abnormal results are displayed) Labs Reviewed  CBC WITH DIFFERENTIAL/PLATELET - Abnormal; Notable for the following:       Result Value   WBC 1.3 (*)    Platelets 98 (*)    Neutro Abs 0.8 (*)    Lymphs Abs 0.4 (*)    All other components within normal limits  COMPREHENSIVE METABOLIC PANEL - Abnormal; Notable for the following:    Chloride 100 (*)    CO2 21 (*)    Glucose, Bld 115 (*)    Creatinine, Ser 2.44 (*)    Calcium 7.9 (*)    Total Protein 5.9 (*)    Albumin 3.0 (*)    ALT 282 (*)    GFR calc non Af Amer 30 (*)    GFR calc Af Amer 35 (*)    Anion gap 20 (*)    All other components within normal limits  ACETAMINOPHEN LEVEL - Abnormal; Notable for the following:    Acetaminophen (Tylenol), Serum <10 (*)    All other components within normal limits  AMMONIA - Abnormal; Notable for the following:    Ammonia 57 (*)  All other components within normal limits  I-STAT CHEM 8, ED - Abnormal; Notable for the following:    Chloride 100 (*)    Creatinine, Ser 2.10 (*)    Glucose, Bld 110 (*)    Calcium, Ion 0.96 (*)    Hemoglobin 18.0 (*)    HCT 53.0 (*)    All other components within normal limits  I-STAT CG4 LACTIC ACID, ED - Abnormal; Notable for the following:    Lactic Acid, Venous 9.62 (*)    All other components within normal limits  I-STAT TROPOININ, ED - Abnormal; Notable for the following:    Troponin i, poc 0.17 (*)    All other components within normal limits  I-STAT ARTERIAL BLOOD GAS, ED - Abnormal; Notable for the following:    pH, Arterial 7.233 (*)    pCO2 arterial 48.4 (*)    pO2, Arterial 72.0 (*)    Acid-base deficit 7.0 (*)    All other components within normal limits  CULTURE, BLOOD (ROUTINE X  2)  CULTURE, BLOOD (ROUTINE X 2)  URINE CULTURE  LIPASE, BLOOD  ETHANOL  SALICYLATE LEVEL  LACTIC ACID, PLASMA  RAPID URINE DRUG SCREEN, HOSP PERFORMED  URINALYSIS, ROUTINE W REFLEX MICROSCOPIC  BRAIN NATRIURETIC PEPTIDE  I-STAT CG4 LACTIC ACID, ED    EKG  EKG Interpretation  Date/Time:  Wednesday December 21 2016 05:26:33 EST Ventricular Rate:  127 PR Interval:    QRS Duration: 78 QT Interval:  292 QTC Calculation: 425 R Axis:   78 Text Interpretation:  Sinus tachycardia Consider right atrial enlargement Confirmed by Blinda Leatherwood  MD, CHRISTOPHER [phone]) on 12/21/2016 6:08:59 AM       Radiology Dg Chest Port 1 View  Result Date: 12/21/2016 CLINICAL DATA:  Cardiac arrest EXAM: PORTABLE CHEST 1 VIEW COMPARISON:  None. FINDINGS: An endotracheal tube is present with tip measuring about 4.3 cm above the carina. An enteric tube has been placed. The tip is in a right lower lobe bronchus and needs to be repositioned. Normal heart size. Diffuse patchy airspace disease throughout the lungs in a perihilar distribution with peripheral sparing. This may represent edema, pneumonia, or hemorrhagic consolidation. Aspiration could also have this appearance in the appropriate setting. Prominent stomach gas. IMPRESSION: Endotracheal tube appears in satisfactory position. Enteric tube is in a right lower lobe bronchus and needs to be repositioned. Diffuse bilateral airspace infiltrates in a mostly perihilar distribution. These results were called by telephone at the time of interpretation on 12/21/2016 at 6:17 am to Dr. Jaci Carrel , who verbally acknowledged these results. Electronically Signed   By: Burman Nieves M.D.   On: 12/21/2016 06:18    Procedures Procedure Name: Intubation Date/Time: 12/21/2016 7:09 AM Performed by: Gilda Crease Pre-anesthesia Checklist: Patient identified, Emergency Drugs available, Suction available, Timeout performed and Patient being monitored Oxygen  Delivery Method: Ambu bag Preoxygenation: Pre-oxygenation with 100% oxygen Intubation Type: Rapid sequence Ventilation: Mask ventilation without difficulty Laryngoscope Size: Glidescope and 4 Tube size: 7.5 mm Number of attempts: 3 (2 attempts made by student and physician assistant were unsuccessful. Patient bagged between attempts.) Placement Confirmation: ETT inserted through vocal cords under direct vision Tube secured with: ETT holder Difficulty Due To: Difficult Airway- due to large tongue, Difficult Airway- due to reduced neck mobility and Difficult Airway- due to limited oral opening      (including critical care time)  Medications Ordered in ED Medications  fentaNYL (SUBLIMAZE) 100 MCG/2ML injection (not administered)  fentaNYL (SUBLIMAZE) 2,500 mcg in sodium  chloride 0.9 % 250 mL (10 mcg/mL) infusion (50 mcg/hr Intravenous Rate/Dose Change 12/21/16 0623)  sodium chloride 0.9 % bolus 1,000 mL (1,000 mLs Intravenous New Bag/Given 12/21/16 0636)    And  sodium chloride 0.9 % bolus 1,000 mL (1,000 mLs Intravenous New Bag/Given 12/21/16 0609)    And  sodium chloride 0.9 % bolus 500 mL (500 mLs Intravenous New Bag/Given 12/21/16 0700)  vancomycin (VANCOCIN) IVPB 1000 mg/200 mL premix (1,000 mg Intravenous New Bag/Given 12/21/16 0646)  midazolam (VERSED) injection 4 mg (4 mg Intravenous Given 12/21/16 0617)  midazolam (VERSED) 2 MG/2ML injection (2 mg  Given 12/21/16 0604)  succinylcholine (ANECTINE) injection (150 mg Intravenous Given 12/21/16 0600)  etomidate (AMIDATE) injection (20 mg Intravenous Given 12/21/16 0540)  piperacillin-tazobactam (ZOSYN) IVPB 3.375 g (0 g Intravenous Stopped 12/21/16 0648)  norepinephrine (LEVOPHED) 4 mg in dextrose 5 % 250 mL (0.016 mg/mL) infusion (10 mcg/min Intravenous Rate/Dose Change 12/21/16 0704)     Initial Impression / Assessment and Plan / ED Course  I have reviewed the triage vital signs and the nursing notes.  Pertinent labs & imaging results  that were available during my care of the patient were reviewed by me and considered in my medical decision making (see chart for details).     Patient reportedly was noted to be having significant difficulty breathing by his significant other while sleeping. She was unable to wake him and then reported that she cannot find pulses. Another family member initiated CPR which was continued by first responders. EMS report return of spontaneous circulation by the time they arrived on scene.  Patient was exhibiting agonal respirations and was not alert at arrival. He had no significant gag reflex, corneal reflex was absent. Patient was therefore intubated.  Patient noted to be febrile. Empiric treatment for sepsis initiated. There was difficulty ascertaining his actual blood pressure, arterial line was placed. At this point it was noted that he was mildly hypotensive, will initiate peripheral Levaphed, admitted to ICU.  CRITICAL CARE Performed by: Gilda Crease   Total critical care time: 40 minutes  Critical care time was exclusive of separately billable procedures and treating other patients.  Critical care was necessary to treat or prevent imminent or life-threatening deterioration.  Critical care was time spent personally by me on the following activities: development of treatment plan with patient and/or surrogate as well as nursing, discussions with consultants, evaluation of patient's response to treatment, examination of patient, obtaining history from patient or surrogate, ordering and performing treatments and interventions, ordering and review of laboratory studies, ordering and review of radiographic studies, pulse oximetry and re-evaluation of patient's condition.   Final Clinical Impressions(s) / ED Diagnoses   Final diagnoses:  Acute respiratory failure with hypoxia (HCC)  Community acquired pneumonia, unspecified laterality    New Prescriptions New Prescriptions    No medications on file     Gilda Crease, MD 12/21/16 [phone]

## 2016-12-21 NOTE — Progress Notes (Signed)
eLink Physician-Brief Progress Note Patient Name: Troy Brennan DOB: 1971/01/30 MRN: [id number]   Date of Service  12/21/2016  HPI/Events of Note  46 yo with acute resp distress Gurgling in sleep CPR started by brother approx 10-15 mins Intubated, art line placed, LA>10, ARF with B/L opacities on CXR  eICU Interventions  Admit to ICU, vent support, Vasopressors support Recommend Check ECHO and check CT head ARDS net protocol empiric abx therapy     Intervention Category Evaluation Type: New Patient Evaluation  [name] 12/21/2016, 6:50 AM

## 2016-12-21 NOTE — H&P (Signed)
PULMONARY / CRITICAL CARE MEDICINE   Name: Troy Brennan MRN: [id number] DOB: October 14, 1971    ADMISSION DATE:  12/21/2016 CONSULTATION DATE:  12/21/2016  REFERRING MD:  Dr. Blinda Leatherwood   CHIEF COMPLAINT: Cardiac Arrest  HISTORY OF PRESENT ILLNESS: Information obtained by medical staff and family as patient is encephalopathic  46 year old male with ETOH and polysubstance abuse, recently discharged from detox facility 8-10 days ago. Family is unaware of further medical history. Girlfriend reports that she has only been with patient for short-period of time and but denies that he has used drug or ETOH since discharge from rehab. For the last 3 days patient has experienced flu like symptoms with full body aches, cough, and fever. This morning girlfriend found patient apneic and pulseless, per [facility] narcan was administered without improvement,  And then underwent multiple rounds of CPR for 10-15 minutes with return of ROSC. Upon arrival to ED patient was non-responsive and was intubated for airway protection. PCCM consulted to admit.   PAST MEDICAL HISTORY :  He  has no past medical history on file.  PAST SURGICAL HISTORY: He  has no past surgical history on file.  No Known Allergies  No current facility-administered medications on file prior to encounter.    No current outpatient prescriptions on file prior to encounter.    FAMILY HISTORY:  His has no family status information on file.    SOCIAL HISTORY: He    REVIEW OF SYSTEMS:   Unable to review as patient is sedated and intubated   SUBJECTIVE:  Intubated, on levophed and fentanyl gtt.   VITAL SIGNS: BP 102/75   Pulse (!) 57   Temp 100.2 F (37.9 C) (Rectal)   Resp 22   Wt 100.4 kg (221 lb 5.5 oz)   SpO2 (!) 85%   HEMODYNAMICS:    VENTILATOR SETTINGS: Vent Mode: PRVC FiO2 (%):  [100 %] 100 % Set Rate:  [18 bmp] 18 bmp Vt Set:  [500 mL] 500 mL PEEP:  [13 cmH20] 13 cmH20  INTAKE / OUTPUT: I/O last 3  completed shifts: In: 27 [IV Piggyback:50] Out: 110 [Urine:110]  PHYSICAL EXAMINATION: General: Adult male, no distress, lying in bed   Neuro: Sedated, moves right arm non-purposefully HEENT: ETT in place, left eye chronic post-op changes    Cardiovascular: Tachy, NI S1/S2, no MRG   Lungs: Rhonchi throughout, on vent  Abdomen: obese, soft, active bowel sounds  Musculoskeletal: no deformities  Skin: modled to lower extremities, dry, intact     LABS:  BMET  Recent Labs Lab 12/21/16 0539 12/21/16 0557  NA 141 141  K 3.8 3.8  CL 100* 100*  CO2  --  21*  BUN 19 16  CREATININE 2.10* 2.44*  GLUCOSE 110* 115*    Electrolytes  Recent Labs Lab 12/21/16 0557  CALCIUM 7.9*    CBC  Recent Labs Lab 12/21/16 0539 12/21/16 0557  WBC  --  1.3*  HGB 18.0* 16.2  HCT 53.0* 51.9  PLT  --  98*    Coag's No results for input(s): APTT, INR in the last 168 hours.  Sepsis Markers  Recent Labs Lab 12/21/16 0540 12/21/16 0616  LATICACIDVEN 9.62* 8.4*    ABG  Recent Labs Lab 12/21/16 0632  PHART 7.233*  PCO2ART 48.4*  PO2ART 72.0*    Liver Enzymes  Recent Labs Lab 12/21/16 0557  AST PENDING  ALT 282*  ALKPHOS 46  BILITOT 0.4  ALBUMIN 3.0*    Cardiac Enzymes No  results for input(s): TROPONINI, PROBNP in the last 168 hours.  Glucose No results for input(s): GLUCAP in the last 168 hours.  Imaging Dg Chest Port 1 View  Result Date: 12/21/2016 CLINICAL DATA:  Cardiac arrest EXAM: PORTABLE CHEST 1 VIEW COMPARISON:  None. FINDINGS: An endotracheal tube is present with tip measuring about 4.3 cm above the carina. An enteric tube has been placed. The tip is in a right lower lobe bronchus and needs to be repositioned. Normal heart size. Diffuse patchy airspace disease throughout the lungs in a perihilar distribution with peripheral sparing. This may represent edema, pneumonia, or hemorrhagic consolidation. Aspiration could also have this appearance in the  appropriate setting. Prominent stomach gas. IMPRESSION: Endotracheal tube appears in satisfactory position. Enteric tube is in a right lower lobe bronchus and needs to be repositioned. Diffuse bilateral airspace infiltrates in a mostly perihilar distribution. These results were called by telephone at the time of interpretation on 12/21/2016 at 6:17 am to Dr. Jaci Carrel , who verbally acknowledged these results. Electronically Signed   By: Burman Nieves M.D.   On: 12/21/2016 06:18     STUDIES:  CXR 2/14 > Diffuse bilateral airspace infiltrates, ? Edema, PNA, or Hemorrhagic Consolidation   CT Head 2/14 >   CULTURES: Blood 2/14 > Sputum 2/14 > Urine 2/14 >  RVP 2/14 >  ANTIBIOTICS: Vancomycin 2/14 > Rocephin 2/14 > Tamiflu 2/14 >   SIGNIFICANT EVENTS: 2/14 > Presents to ED after being found pulseless/apneic at home   LINES/TUBES: ETT 2/14 >>   DISCUSSION: 46 year old male with known ETOH and polysubstance abuse. Discharged from 8-10 days ago from Detox facility. For the last 3 days has had fever, chills, cough, and weakness, this morning girlfriend found him unresponsive and apneic. [facility] report 5-10 minutes of CPR before ROSC. ?Aspiration event vs CAP.    ASSESSMENT / PLAN:  PULMONARY A: Acute Respiratory Failure secondary to possible aspiration vs PNA  P:   Full Vent Support PAD protocol  RVP Panel pending  Rapid Flu pending  Trend CXR   CARDIOVASCULAR A:  Cardiac Arrest  Septic Shock Hypotension  P:  Cardiac Monitoring  Wean Levophed to Maintain MAP >70 Will need Central Line Trend Trop  Normothermia protocol to 37  RENAL A:   Acute Kidney Injury  Lactic Acidosis  P:   Trend BMP  Hydrate d5NS @100  ml/hr   GASTROINTESTINAL A:   No issues  P:   NPO PPI   HEMATOLOGIC A:   Leukopenia  P:  Trend CBC  Hold SQ Heparin until CT Head completed   INFECTIOUS A:   Aspiration vs CAP P:   Trend fever and WBC  Trend Lactic acid and  Procal  PAN culture  Tamiflu  Vancomycin and Rocephin   ENDOCRINE A:   Hypoglycemia  P:   Glucose checks q1H per hypothermia  Cortisol Pending   NEUROLOGIC A:   Metabolic Encephalopathy   H/O ETOH and polysubstance abuse  >Ethyl level <5 P:  CT Head  EEG   Folic acid and thiamine  Urine drug screen processing  RASS goal: 0/-1    FAMILY  - Updates: Girlfriend updated at bedside, sister updated on phone.   - Inter-disciplinary family meet or Palliative Care meeting due by: 2/21    Jovita Kussmaul, AG-ACNP [facility]  Pgr: [phone]  PCCM Pgr: [phone]  STAFF NOTE: Cindi Carbon, MD FACP have personally reviewed patient's available data, including medical history, events  of note, physical examination and test results as part of my evaluation. I have discussed with resident/NP and other care providers such as pharmacist, RN and RRT. In addition, I personally evaluated patient and elicited key findings of: not awake, not fc, ronchi bilateral moderate diffuse insp and exp, no crepitus, no sig edema, pcxr c/w diffuse int infiltrates, s/p arrest, etiology r/o flu /ards vs drug OD with aspiration, would peruse TTM normothermia but not hypothermia in setting septic shock conerns, levophed to MAp goal 65, place line, get cvp, follow lactic, get echo to r/o pulm edema caused by cardiomyaotphy (etoh), treat vanc, ctx, tamiflu, keep plat less 30 , place aline , no role nimbex for now, limit sedation as able, get eeg, CT head, rate increase vent, correct ph as able, to 7 cc/kg, repeat abg, concern sepsis with leukopenia noted, follow cbc with diff further, iso9late, no fa ily at bedside The patient is critically ill with multiple organ systems failure and requires high complexity decision making for assessment and support, frequent evaluation and titration of therapies, application of advanced monitoring technologies and extensive interpretation of  multiple databases.   Critical Care Time devoted to patient care services described in this note is 90 Minutes. This time reflects time of care of this signee: Rory Percy, MD FACP. This critical care time does not reflect procedure time, or teaching time or supervisory time of PA/NP/Med student/Med Resident etc but could involve care discussion time. Rest per NP/medical resident whose note is outlined above and that I agree with   Mcarthur Rossetti. Tyson Alias, MD, FACP Pgr: [phone] [facility] 12/21/2016 9:42 AM

## 2016-12-21 NOTE — ED Notes (Signed)
Ice packs placed on patient as CCM at bedside deciding to cool patient.

## 2016-12-21 NOTE — ED Notes (Signed)
Patient presents to ed via [facility] states girlfriend woke up to patient with snoring type resp. Called ems , FD arrived to find patient pulseless and apneic  FD gave patient 2mg  Narcan nasal spray . Upon ems arrival patient regained pulses at 0435 CBG was 52 was given D10 250cc repeat CBG was 217. Upon arrival to ED patient was being bagged with nasal airway in left nares. Patient was unresp. Lungs with bilateral rhonci.

## 2016-12-22 ENCOUNTER — Inpatient Hospital Stay (HOSPITAL_COMMUNITY): Payer: Self-pay

## 2016-12-22 DIAGNOSIS — J189 Pneumonia, unspecified organism: Secondary | ICD-10-CM

## 2016-12-22 DIAGNOSIS — Z9911 Dependence on respirator [ventilator] status: Secondary | ICD-10-CM

## 2016-12-22 DIAGNOSIS — Z452 Encounter for adjustment and management of vascular access device: Secondary | ICD-10-CM

## 2016-12-22 LAB — BASIC METABOLIC PANEL
ANION GAP: 14 (ref 5–15)
BUN: 30 mg/dL — ABNORMAL HIGH (ref 6–20)
CALCIUM: 6.6 mg/dL — AB (ref 8.9–10.3)
CHLORIDE: 108 mmol/L (ref 101–111)
CO2: 17 mmol/L — AB (ref 22–32)
Creatinine, Ser: 2.01 mg/dL — ABNORMAL HIGH (ref 0.61–1.24)
GFR calc non Af Amer: 38 mL/min — ABNORMAL LOW (ref >60)
GFR, EST AFRICAN AMERICAN: 44 mL/min — AB (ref >60)
Glucose, Bld: 102 mg/dL — ABNORMAL HIGH (ref 65–99)
Potassium: 4.7 mmol/L (ref 3.5–5.1)
Sodium: 139 mmol/L (ref 135–145)

## 2016-12-22 LAB — CBC
HCT: 46.6 % (ref 39.0–52.0)
Hemoglobin: 15 g/dL (ref 13.0–17.0)
MCH: 28.1 pg (ref 26.0–34.0)
MCHC: 32.2 g/dL (ref 30.0–36.0)
MCV: 87.4 fL (ref 78.0–100.0)
PLATELETS: 65 10*3/uL — AB (ref 150–400)
RBC: 5.33 MIL/uL (ref 4.22–5.81)
RDW: 13.2 % (ref 11.5–15.5)
WBC: 6.3 10*3/uL (ref 4.0–10.5)

## 2016-12-22 LAB — POCT I-STAT 3, ART BLOOD GAS (G3+)
ACID-BASE DEFICIT: 9 mmol/L — AB (ref 0.0–2.0)
ACID-BASE DEFICIT: 7 mmol/L — AB (ref 0.0–2.0)
ACID-BASE DEFICIT: 9 mmol/L — AB (ref 0.0–2.0)
ACID-BASE DEFICIT: 5 mmol/L — AB (ref 0.0–2.0)
Acid-base deficit: 7 mmol/L — ABNORMAL HIGH (ref 0.0–2.0)
Acid-base deficit: 7 mmol/L — ABNORMAL HIGH (ref 0.0–2.0)
Acid-base deficit: 7 mmol/L — ABNORMAL HIGH (ref 0.0–2.0)
Acid-base deficit: 7 mmol/L — ABNORMAL HIGH (ref 0.0–2.0)
Acid-base deficit: 6 mmol/L — ABNORMAL HIGH (ref 0.0–2.0)
BICARBONATE: 20 mmol/L (ref 20.0–28.0)
BICARBONATE: 18.1 mmol/L — AB (ref 20.0–28.0)
BICARBONATE: 18.1 mmol/L — AB (ref 20.0–28.0)
BICARBONATE: 20 mmol/L (ref 20.0–28.0)
BICARBONATE: 19.3 mmol/L — AB (ref 20.0–28.0)
Bicarbonate: 22.7 mmol/L (ref 20.0–28.0)
Bicarbonate: 18.4 mmol/L — ABNORMAL LOW (ref 20.0–28.0)
Bicarbonate: 17.2 mmol/L — ABNORMAL LOW (ref 20.0–28.0)
Bicarbonate: 18.1 mmol/L — ABNORMAL LOW (ref 20.0–28.0)
O2 SAT: 100 %
O2 SAT: 97 %
O2 SAT: 99 %
O2 Saturation: 98 %
O2 Saturation: 90 %
O2 Saturation: 99 %
O2 Saturation: 98 %
O2 Saturation: 99 %
O2 Saturation: 98 %
PCO2 ART: 34.2 mmHg (ref 32.0–48.0)
PCO2 ART: 34.9 mmHg (ref 32.0–48.0)
PCO2 ART: 35.5 mmHg (ref 32.0–48.0)
PCO2 ART: 37.2 mmHg (ref 32.0–48.0)
PH ART: 7.143 — AB (ref 7.350–7.450)
PH ART: 7.331 — AB (ref 7.350–7.450)
PH ART: 7.322 — AB (ref 7.350–7.450)
PH ART: 7.317 — AB (ref 7.350–7.450)
PH ART: 7.331 — AB (ref 7.350–7.450)
PO2 ART: 120 mmHg — AB (ref 83.0–108.0)
PO2 ART: 135 mmHg — AB (ref 83.0–108.0)
PO2 ART: 117 mmHg — AB (ref 83.0–108.0)
Patient temperature: 98.6
TCO2: 25 mmol/L (ref 0–100)
TCO2: 22 mmol/L (ref 0–100)
TCO2: 19 mmol/L (ref 0–100)
TCO2: 18 mmol/L (ref 0–100)
TCO2: 19 mmol/L (ref 0–100)
TCO2: 19 mmol/L (ref 0–100)
TCO2: 19 mmol/L (ref 0–100)
TCO2: 21 mmol/L (ref 0–100)
TCO2: 20 mmol/L (ref 0–100)
pCO2 arterial: 66.3 mmHg (ref 32.0–48.0)
pCO2 arterial: 55 mmHg — ABNORMAL HIGH (ref 32.0–48.0)
pCO2 arterial: 35 mmHg (ref 32.0–48.0)
pCO2 arterial: 36.1 mmHg (ref 32.0–48.0)
pCO2 arterial: 36.6 mmHg (ref 32.0–48.0)
pH, Arterial: 7.169 — CL (ref 7.350–7.450)
pH, Arterial: 7.329 — ABNORMAL LOW (ref 7.350–7.450)
pH, Arterial: 7.286 — ABNORMAL LOW (ref 7.350–7.450)
pH, Arterial: 7.338 — ABNORMAL LOW (ref 7.350–7.450)
pO2, Arterial: 139 mmHg — ABNORMAL HIGH (ref 83.0–108.0)
pO2, Arterial: 75 mmHg — ABNORMAL LOW (ref 83.0–108.0)
pO2, Arterial: 276 mmHg — ABNORMAL HIGH (ref 83.0–108.0)
pO2, Arterial: 102 mmHg (ref 83.0–108.0)
pO2, Arterial: 124 mmHg — ABNORMAL HIGH (ref 83.0–108.0)
pO2, Arterial: 131 mmHg — ABNORMAL HIGH (ref 83.0–108.0)

## 2016-12-22 LAB — PHOSPHORUS: PHOSPHORUS: 5.3 mg/dL — AB (ref 2.5–4.6)

## 2016-12-22 LAB — PROCALCITONIN: PROCALCITONIN: 144.5 ng/mL

## 2016-12-22 LAB — HEPATITIS PANEL, ACUTE
HCV Ab: >11 s/co ratio — ABNORMAL HIGH (ref 0.0–0.9)
HEP B S AG: NEGATIVE
Hep A IgM: NEGATIVE
Hep B C IgM: NEGATIVE

## 2016-12-22 LAB — URINE CULTURE: Culture: NO GROWTH

## 2016-12-22 LAB — TROPONIN I: TROPONIN I: 0.18 ng/mL — AB (ref <0.03)

## 2016-12-22 LAB — HIV ANTIBODY (ROUTINE TESTING W REFLEX): HIV Screen 4th Generation wRfx: NONREACTIVE

## 2016-12-22 LAB — GLUCOSE, CAPILLARY
GLUCOSE-CAPILLARY: 105 mg/dL — AB (ref 65–99)
GLUCOSE-CAPILLARY: 121 mg/dL — AB (ref 65–99)
Glucose-Capillary: 92 mg/dL (ref 65–99)
Glucose-Capillary: 81 mg/dL (ref 65–99)
Glucose-Capillary: 78 mg/dL (ref 65–99)
Glucose-Capillary: 104 mg/dL — ABNORMAL HIGH (ref 65–99)

## 2016-12-22 LAB — MAGNESIUM: MAGNESIUM: 1.6 mg/dL — AB (ref 1.7–2.4)

## 2016-12-22 MED ORDER — FENTANYL CITRATE (PF) 100 MCG/2ML IJ SOLN
100.0000 ug | Freq: Once | INTRAMUSCULAR | Status: AC
  Administered 2016-12-22: 100 ug via INTRAVENOUS

## 2016-12-22 MED ORDER — MIDAZOLAM HCL 2 MG/2ML IJ SOLN: 2.0000 mg | Freq: Once | INTRAMUSCULAR | Status: AC | PRN

## 2016-12-22 MED ORDER — CHLORHEXIDINE GLUCONATE CLOTH 2 % EX PADS
6.0000 | MEDICATED_PAD | Freq: Every day | CUTANEOUS | Status: DC
  Administered 2016-12-22 – 2016-12-29 (×8): 6 via TOPICAL

## 2016-12-22 MED ORDER — MIDAZOLAM BOLUS VIA INFUSION
2.0000 mg | INTRAVENOUS | Status: DC | PRN
Start: 2016-12-22 — End: 2016-12-26
  Administered 2016-12-22 – 2016-12-23 (×2): 2 mg via INTRAVENOUS
  Filled 2016-12-22: qty 2

## 2016-12-22 MED ORDER — MIDAZOLAM HCL 2 MG/2ML IJ SOLN
2.0000 mg | Freq: Once | INTRAMUSCULAR | Status: AC
  Administered 2016-12-22: 2 mg via INTRAVENOUS

## 2016-12-22 MED ORDER — METHYLPREDNISOLONE SODIUM SUCC 40 MG IJ SOLR
40.0000 mg | Freq: Two times a day (BID) | INTRAMUSCULAR | Status: DC
  Administered 2016-12-22 – 2016-12-25 (×7): 40 mg via INTRAVENOUS
  Filled 2016-12-22 (×7): qty 1

## 2016-12-22 MED ORDER — FENTANYL CITRATE (PF) 2500 MCG/50ML IJ SOLN
100.0000 ug/h | INTRAMUSCULAR | Status: DC
  Administered 2016-12-23 (×3): 50 ug/h via INTRAVENOUS
  Administered 2016-12-23: 400 ug/h via INTRAVENOUS
  Administered 2016-12-23: 300 ug/h via INTRAVENOUS
  Administered 2016-12-23: 400 ug/h via INTRAVENOUS
  Administered 2016-12-24 (×2): 50 ug/h via INTRAVENOUS
  Administered 2016-12-24: 400 ug/h via INTRAVENOUS
  Administered 2016-12-24: 200 ug/h via INTRAVENOUS
  Administered 2016-12-24: 50 ug/h via INTRAVENOUS
  Administered 2016-12-25: 100 ug/h via INTRAVENOUS
  Administered 2016-12-25: 50 ug/h via INTRAVENOUS
  Administered 2016-12-25: 100 ug/h via INTRAVENOUS
  Administered 2016-12-25 (×2): 50 ug/h via INTRAVENOUS
  Administered 2016-12-25: 375 ug/h via INTRAVENOUS
  Administered 2016-12-25: 50 ug/h via INTRAVENOUS
  Administered 2016-12-25: 150 ug/h via INTRAVENOUS
  Administered 2016-12-25 (×3): 50 ug/h via INTRAVENOUS
  Administered 2016-12-25: 100 ug/h via INTRAVENOUS
  Administered 2016-12-25: 50 ug/h via INTRAVENOUS
  Administered 2016-12-25: 200 ug/h via INTRAVENOUS
  Administered 2016-12-26: 400 ug/h via INTRAVENOUS
  Administered 2016-12-26: 300 ug/h via INTRAVENOUS
  Administered 2016-12-26: 250 ug/h via INTRAVENOUS
  Administered 2016-12-27 – 2016-12-28 (×5): 300 ug/h via INTRAVENOUS
  Administered 2016-12-29: 250 ug/h via INTRAVENOUS
  Administered 2016-12-29 (×2): 50 ug/h via INTRAVENOUS
  Administered 2016-12-29: 300 ug/h via INTRAVENOUS
  Administered 2016-12-30 (×2): 50 ug/h via INTRAVENOUS
  Administered 2016-12-30: 125 ug/h via INTRAVENOUS
  Administered 2016-12-30: 100 ug/h via INTRAVENOUS
  Administered 2016-12-30 (×2): 50 ug/h via INTRAVENOUS
  Filled 2016-12-22 (×23): qty 50

## 2016-12-22 MED ORDER — ARTIFICIAL TEARS OP OINT
1.0000 "application " | TOPICAL_OINTMENT | Freq: Three times a day (TID) | OPHTHALMIC | Status: DC
  Administered 2016-12-22 – 2016-12-23 (×5): 1 via OPHTHALMIC
  Filled 2016-12-22: qty 3.5

## 2016-12-22 MED ORDER — SODIUM BICARBONATE 8.4 % IV SOLN
INTRAVENOUS | Status: DC
  Administered 2016-12-22 – 2016-12-23 (×2): via INTRAVENOUS
  Filled 2016-12-22 (×4): qty 150

## 2016-12-22 MED ORDER — SODIUM CHLORIDE 0.9 % IV SOLN
2.0000 mg/h | INTRAVENOUS | Status: DC
  Administered 2016-12-22 – 2016-12-23 (×2): 10 mg/h via INTRAVENOUS
  Filled 2016-12-22 (×3): qty 10

## 2016-12-22 MED ORDER — SODIUM CHLORIDE 0.9% FLUSH
10.0000 mL | Freq: Two times a day (BID) | INTRAVENOUS | Status: DC
  Administered 2016-12-22 – 2016-12-29 (×15): 10 mL

## 2016-12-22 MED ORDER — FENTANYL BOLUS VIA INFUSION
50.0000 ug | INTRAVENOUS | Status: DC | PRN
  Administered 2016-12-25 – 2016-12-31 (×9): 50 ug via INTRAVENOUS
  Filled 2016-12-22: qty 50

## 2016-12-22 MED ORDER — DEXTROSE 5 % IV SOLN
1.0000 g | Freq: Three times a day (TID) | INTRAVENOUS | Status: DC
  Administered 2016-12-22 – 2016-12-29 (×22): 1 g via INTRAVENOUS
  Filled 2016-12-22 (×23): qty 1

## 2016-12-22 MED ORDER — SODIUM CHLORIDE 0.9 % IV SOLN
0.0300 [IU]/min | INTRAVENOUS | Status: DC
  Administered 2016-12-22: 0.03 [IU]/min via INTRAVENOUS
  Filled 2016-12-22 (×2): qty 2

## 2016-12-22 MED ORDER — FENTANYL CITRATE (PF) 100 MCG/2ML IJ SOLN: 100.0000 ug | Freq: Once | INTRAMUSCULAR | Status: DC | PRN

## 2016-12-22 MED ORDER — SODIUM CHLORIDE 0.9% FLUSH: 10.0000 mL | INTRAVENOUS | Status: DC | PRN

## 2016-12-22 MED ORDER — SODIUM CHLORIDE 0.9 % IV SOLN
3.0000 ug/kg/min | INTRAVENOUS | Status: DC
  Administered 2016-12-22: 3 ug/kg/min via INTRAVENOUS
  Filled 2016-12-22 (×3): qty 20

## 2016-12-22 MED ORDER — CISATRACURIUM BOLUS VIA INFUSION
0.1000 mg/kg | Freq: Once | INTRAVENOUS | Status: AC
  Administered 2016-12-22: 8.4 mg via INTRAVENOUS
  Filled 2016-12-22: qty 9

## 2016-12-22 NOTE — Progress Notes (Signed)
Initial Nutrition Assessment   INTERVENTION:   Recommend: Vital AF 1.2 @ 70 ml/hr (1680 ml/day) Provides: 2016 kcal, 126 grams protein, and 1362 ml H2O.   NUTRITION DIAGNOSIS:   Inadequate oral intake related to inability to eat as evidenced by NPO status.  GOAL:   Patient will meet greater than or equal to 90% of their needs  MONITOR:   I & O's, Vent status  REASON FOR ASSESSMENT:   Ventilator    ASSESSMENT:   Pt with PMH of ETOH and polysubstance abuse, recently discharged from detox facility 8-10 days ago. Found in cardiac arrest, septic, normothermia, ards.  2/15 Cortrak placed, tip in 4th portion of duodenum, proned at 1305 Patient is currently intubated on ventilator support MV: 15.4 L/min Temp (24hrs), Avg:96.9 F (36.1 C), Min:94.8 F (34.9 C), Max:98.4 F (36.9 C)  Medications reviewed and include: solumedrol, folic acid, thiamine Labs reviewed: PO4: 5.3, magnesium 1.6 Nutrition-Focused physical exam completed. Findings are no fat depletion, no muscle depletion, and no edema.     Diet Order:  Diet NPO time specified  Skin:  Reviewed, no issues  Last BM:  2/14  Height:   Ht Readings from Last 1 Encounters:  No data found for Ht    Weight:   Wt Readings from Last 1 Encounters:  12/21/16 185 lb 13.6 oz (84.3 kg)    Ideal Body Weight:     BMI:  There is no height or weight on file to calculate BMI.  Estimated Nutritional Needs:   Kcal:  2095  Protein:  125-140 grams  Fluid:  > 2L/day  EDUCATION NEEDS:   No education needs identified at this time  Kendell Bane RD, LDN, CNSC [phone] Pager [phone] After Hours Pager

## 2016-12-22 NOTE — Progress Notes (Signed)
1305 patient proned

## 2016-12-22 NOTE — Progress Notes (Signed)
RN called MD Tyson Alias. Pt's o2 probe not reading accurately (ie reading 70-80% and ABG at 1430 resulted with o2 of 100%). RN to repeat ABG q hour to verify proper oxygenation. Also notified MD that RN is unable to perform TOF monitoring due to head placement while prone. RN had been testing TOF on eyebrow prior to proning with positive 2/4 twitches on 1 of nimbex. RN  is unable to test on pt's wrist because no nerve stimulation or twitching resulted at the beginning of testing, prior to starting any medications. MD said it was ok to stop TOF testing but to try to maintain a BIS between 20-40 if possible. MD gave verbal orders to go up to 400 of fentanyl and 10 of versed, but not to titrate the nimbex any further because proper TOF was obtained at current rate of nimbex. MD changed ventilator settings to 50% fio2, 12 peep, 30 rate, RN made respiratory aware of these changes. Will continue to monitor closely.

## 2016-12-22 NOTE — Progress Notes (Signed)
Responded to spiritual care consult to visit with patient. Patient is unavailable.  Staff  Performing procedure at time of my visit.c  Chaplain available as needed. Venida Jarvis, [place], pager [phone]

## 2016-12-22 NOTE — Progress Notes (Addendum)
PULMONARY / CRITICAL CARE MEDICINE   Name: Troy Brennan MRN: [id number] DOB: 1971-02-21    ADMISSION DATE:  12/21/2016 CONSULTATION DATE:  12/21/2016  REFERRING MD:  Dr. Blinda Leatherwood   CHIEF COMPLAINT: Cardiac Arrest  HISTORY OF PRESENT ILLNESS:  46 year old male with ETOH and polysubstance abuse, recently discharged from detox facility 8-10 days ago. Found in cardiac arrest, septic, normothermia, ards.  SUBJECTIVE:  Levo at 21, pct up, needed higher MV, peep increased  VITAL SIGNS: BP (!) 100/58   Pulse (!) 103   Temp (!) 96.8 F (36 C)   Resp (!) 30   Wt 84.3 kg (185 lb 13.6 oz)   SpO2 100%   HEMODYNAMICS: CVP:  [8 mmHg-15 mmHg] 15 mmHg  VENTILATOR SETTINGS: Vent Mode: PRVC FiO2 (%):  [100 %] 100 % Set Rate:  [18 bmp-30 bmp] 30 bmp Vt Set:  [500 mL-550 mL] 550 mL PEEP:  [10 cmH20-15 cmH20] 15 cmH20 Plateau Pressure:  [28 cmH20-33 cmH20] 33 cmH20  INTAKE / OUTPUT: I/O last 3 completed shifts: In: 4216.1 [I.V.:3836.1; NG/GT:30; IV Piggyback:350] Out: 1270 [Urine:1270]  PHYSICAL EXAMINATION: General: Adult male, not paralyzed, sedated Neuro: Sedated, moving all ext non purposeful, WD pain, sluggish pupil rt, chronic left HEENT: ETT in place, left eye chronic Cardiovascular: S1/S2, tachy no MRG   Lungs: coarse Abdomen: obese, soft, lower bowel sounds  Musculoskeletal: no deformities  Skin: modled to lower extremities livido, dry, intact , has pulses lowers    LABS:  BMET  Recent Labs Lab 12/21/16 0914 12/21/16 2030 12/22/16 0420  NA 140 137 139  K 4.1 3.8 4.7  CL 107 108 108  CO2 24 17* 17*  BUN 21* 27* 30*  CREATININE 2.27* 1.98* 2.01*  GLUCOSE 88 152* 102*    Electrolytes  Recent Labs Lab 12/21/16 0914 12/21/16 2030 12/22/16 0420  CALCIUM 6.9* 6.6* 6.6*  MG  --   --  1.6*  PHOS  --   --  5.3*    CBC  Recent Labs Lab 12/21/16 0557 12/21/16 1408 12/22/16 0420  WBC 1.3* 2.0* 6.3  HGB 16.2 15.2 15.0  HCT 51.9 48.3 46.6  PLT  98* 65* 65*    Coag's  Recent Labs Lab 12/21/16 0914 12/21/16 1848  APTT 38* 45*  INR 1.60 1.53    Sepsis Markers  Recent Labs Lab 12/21/16 0540 12/21/16 0616 12/21/16 0914 12/22/16 0420  LATICACIDVEN 9.62* 8.4* 3.8*  --   PROCALCITON  --   --  49.60 144.50    ABG  Recent Labs Lab 12/21/16 0632 12/21/16 1131 12/21/16 1304  PHART 7.233* 7.185* 7.193*  PCO2ART 48.4* 50.5* 48.4*  PO2ART 72.0* 66.0* 71.0*    Liver Enzymes  Recent Labs Lab 12/21/16 0557  AST 324*  ALT 282*  ALKPHOS 46  BILITOT 0.4  ALBUMIN 3.0*    Cardiac Enzymes  Recent Labs Lab 12/21/16 1408 12/21/16 2030 12/22/16 0414  TROPONINI 0.21* 0.18* 0.18*    Glucose  Recent Labs Lab 12/21/16 1302 12/21/16 1625 12/21/16 2032 12/22/16 0052 12/22/16 0423 12/22/16 0832  GLUCAP 198* 111* 134* 105* 92 81    Imaging Ct Head Wo Contrast  Result Date: 12/21/2016 CLINICAL DATA:  Altered mental status, cardiac arrest, resuscitation, substance abuse, alcohol abuse EXAM: CT HEAD WITHOUT CONTRAST TECHNIQUE: Contiguous axial images were obtained from the base of the skull through the vertex without intravenous contrast. COMPARISON:  None available FINDINGS: Brain: No evidence of acute infarction, hemorrhage, hydrocephalus, extra-axial collection or mass lesion/mass effect.  Vascular: No hyperdense vessel or unexpected calcification. Skull: Normal. Negative for fracture or focal lesion. Sinuses/Orbits: No acute finding. Other: None. IMPRESSION: Normal head CT without contrast Electronically Signed   By: Judie Petit.  Shick M.D.   On: 12/21/2016 08:58   Dg Chest Port 1 View  Result Date: 12/22/2016 CLINICAL DATA:  Respiratory failure, cardiac arrest. Intubated patient. EXAM: PORTABLE CHEST 1 VIEW COMPARISON:  21 December 2016 portable chest x-ray. FINDINGS: The lungs are well-expanded. Confluent alveolar opacities bilaterally have become slightly less conspicuous and less confluent. There is no significant  pleural effusion. There is no pneumothorax. The heart is normal in size. The pulmonary vascularity is less engorged and more distinct. The endotracheal tube tip lies 3.5 cm above the carina. The esophagogastric tube tip and proximal port project in the gastric cardia. The left internal jugular venous catheter tip projects over the junction of middle and distal thirds of the SVC. IMPRESSION: Improved appearance of the pulmonary interstitium with decreased confluence of the alveolar opacities bilaterally. Decreased pulmonary vascular congestion. The support tubes are in reasonable position. Electronically Signed   By: David  [place] M.D.   On: 12/22/2016 07:17   Dg Chest Port 1 View  Result Date: 12/21/2016 CLINICAL DATA:  Central catheter placed EXAM: PORTABLE CHEST 1 VIEW COMPARISON:  December 21, 2016 study obtained earlier in the day FINDINGS: Central catheter tip is in the left innominate vein. Endotracheal tube tip is 2.3 cm above the carina. Nasogastric tube tip and side port in stomach. No pneumothorax. There is interstitial and alveolar opacity in a perihilar distribution, stable from earlier in the day. Heart size normal. Mild pulmonary venous hypertension. No adenopathy evident. No bone lesions. IMPRESSION: Tube and catheter positions as described without pneumothorax. Perihilar interstitial alveolar opacity. Suspect noncardiogenic pulmonary edema, although hemorrhage or atypical pneumonia could present in this manner. Stable cardiac silhouette. Electronically Signed   By: Bretta Bang III M.D.   On: 12/21/2016 10:36     STUDIES:  CXR 2/14 > Diffuse bilateral airspace infiltrates, ? Edema, PNA, or Hemorrhagic Consolidation   CT Head 2/14 > neg  CULTURES: Blood 2/14 > Sputum 2/14 > Urine 2/14 >  RVP 2/14 >POS flu B  ANTIBIOTICS: Vancomycin 2/14 >>> Rocephin 2/14 >>> Tamiflu 2/14 >>>   SIGNIFICANT EVENTS: 2/14 > Presents to ED after being found pulseless/apneic at home    LINES/TUBES: ETT 2/14 >>  2/14 left IJ>>> 2 /14 a line rt rad>>>  DISCUSSION: 46 year old male with known ETOH and polysubstance abuse. Discharged from 8-10 days ago from Detox facility. For the last 3 days has had fever, chills, cough, and weakness, this morning girlfriend found him unresponsive and apneic. First Responders report 5-10 minutes of CPR before ROSC. ?Aspiration event vs CAP.    ASSESSMENT / PLAN:  PULMONARY A: Acute Respiratory Failure Flu pneumonitis ARDS Likely super infection bacterial PNA P:   ARDS protocol, keep plat less 30  ABG now and follow up needed Currently requiring rate 30 and peep 15, likely to need PCV attempts I would like to assess abg and then likely meets crietria for prone 3 liters pos balance, cvp 12-15, now reduce pos balance See ID Avoid acidosis of ph less 7.20 with anoxia concerns, some permissive hypercapnia okay factt Add steroids for pneumonitis concerns Treat nonag / ards with bicarb  CARDIOVASCULAR A:  Cardiac Arrest  Septic Shock P:  cvp 12-15, kvo Levophed ot map goals Add vaso May need athos angiotensin 2 infusion if available No  role cortisol as we are giving steroids as above anyway  RENAL A:   Acute Kidney Injury , ATN P:   Dc current fluids Add bicarb cvp 12, kvo all else  GASTROINTESTINAL A:   ARDS P:   NPO PPI  Place post pyloric, for feeding with prone position LFT follow up  HEMATOLOGIC A:   Leukopenia improved P:  Trend CBC in am  Hold SQ Heparin with plat trend scd  INFECTIOUS A:   Flu B pneumonitis with presumed bacterial PNA Aspiration? P:   PCT triple, broaden, CTX to ceftaz Keep vanc Keep tamiflu , dose adjusted for renal failure, plan 10 days  ENDOCRINE A:   Hypoglycemia improved P:   d5 in fluids TF soon as able Cortisol 40, no need stress steroids after flu pneumonitis roids reduced  NEUROLOGIC A:   S/p arrest, at risk anoxia Metabolic Encephalopathy   H/O ETOH  and polysubstance abuse  >Ethyl level <5 P:  eeg without focus Ct head neg Maintain normothermia x 72 hours in total May need paralysis due to ards vent needs   FAMILY  - Updates: need social work to get me a Management consultant  - Inter-disciplinary family meet or Palliative Care meeting due by: 2/21  Ccm time 85 min  Mcarthur Rossetti. Tyson Alias, MD, FACP Pgr: [phone] [facility] 12/22/2016 8:36 AM    ABg repeated, peep to 12 , rate is maxed For prone x 16 hours  Mcarthur Rossetti. Tyson Alias, MD, FACP Pgr: [phone] [facility]

## 2016-12-23 ENCOUNTER — Inpatient Hospital Stay (HOSPITAL_COMMUNITY): Payer: Self-pay

## 2016-12-23 DIAGNOSIS — J9600 Acute respiratory failure, unspecified whether with hypoxia or hypercapnia: Secondary | ICD-10-CM

## 2016-12-23 LAB — POCT I-STAT 3, ART BLOOD GAS (G3+)
ACID-BASE DEFICIT: 6 mmol/L — AB (ref 0.0–2.0)
ACID-BASE DEFICIT: 6 mmol/L — AB (ref 0.0–2.0)
ACID-BASE DEFICIT: 5 mmol/L — AB (ref 0.0–2.0)
ACID-BASE DEFICIT: 4 mmol/L — AB (ref 0.0–2.0)
ACID-BASE EXCESS: 2 mmol/L (ref 0.0–2.0)
ACID-BASE EXCESS: 2 mmol/L (ref 0.0–2.0)
Acid-base deficit: 1 mmol/L (ref 0.0–2.0)
Acid-base deficit: 3 mmol/L — ABNORMAL HIGH (ref 0.0–2.0)
Acid-base deficit: 3 mmol/L — ABNORMAL HIGH (ref 0.0–2.0)
Acid-base deficit: 4 mmol/L — ABNORMAL HIGH (ref 0.0–2.0)
Acid-base deficit: 5 mmol/L — ABNORMAL HIGH (ref 0.0–2.0)
Acid-base deficit: 6 mmol/L — ABNORMAL HIGH (ref 0.0–2.0)
BICARBONATE: 19.2 mmol/L — AB (ref 20.0–28.0)
BICARBONATE: 19.8 mmol/L — AB (ref 20.0–28.0)
BICARBONATE: 23.4 mmol/L (ref 20.0–28.0)
BICARBONATE: 19.8 mmol/L — AB (ref 20.0–28.0)
BICARBONATE: 20.5 mmol/L (ref 20.0–28.0)
BICARBONATE: 20.9 mmol/L (ref 20.0–28.0)
BICARBONATE: 21.3 mmol/L (ref 20.0–28.0)
BICARBONATE: 31.5 mmol/L — AB (ref 20.0–28.0)
Bicarbonate: 18.8 mmol/L — ABNORMAL LOW (ref 20.0–28.0)
Bicarbonate: 19.3 mmol/L — ABNORMAL LOW (ref 20.0–28.0)
Bicarbonate: 21.3 mmol/L (ref 20.0–28.0)
Bicarbonate: 28.7 mmol/L — ABNORMAL HIGH (ref 20.0–28.0)
O2 SAT: 99 %
O2 SAT: 100 %
O2 SAT: 99 %
O2 SAT: 99 %
O2 SAT: 99 %
O2 SAT: 99 %
O2 SAT: 99 %
O2 Saturation: 93 %
O2 Saturation: 96 %
O2 Saturation: 99 %
O2 Saturation: 99 %
O2 Saturation: 99 %
PCO2 ART: 35.1 mmHg (ref 32.0–48.0)
PCO2 ART: 35.4 mmHg (ref 32.0–48.0)
PCO2 ART: 35.7 mmHg (ref 32.0–48.0)
PCO2 ART: 35.8 mmHg (ref 32.0–48.0)
PCO2 ART: 37.9 mmHg (ref 32.0–48.0)
PCO2 ART: 56.1 mmHg — AB (ref 32.0–48.0)
PH ART: 7.257 — AB (ref 7.350–7.450)
PH ART: 7.323 — AB (ref 7.350–7.450)
PH ART: 7.339 — AB (ref 7.350–7.450)
PH ART: 7.353 (ref 7.350–7.450)
PH ART: 7.363 (ref 7.350–7.450)
PH ART: 7.395 (ref 7.350–7.450)
PH ART: 7.398 (ref 7.350–7.450)
PO2 ART: 132 mmHg — AB (ref 83.0–108.0)
PO2 ART: 137 mmHg — AB (ref 83.0–108.0)
PO2 ART: 174 mmHg — AB (ref 83.0–108.0)
PO2 ART: 150 mmHg — AB (ref 83.0–108.0)
PO2 ART: 145 mmHg — AB (ref 83.0–108.0)
PO2 ART: 159 mmHg — AB (ref 83.0–108.0)
PO2 ART: 77 mmHg — AB (ref 83.0–108.0)
PO2 ART: 100 mmHg (ref 83.0–108.0)
Patient temperature: 36.7
Patient temperature: 37.1
Patient temperature: 37.3
Patient temperature: 37.3
Patient temperature: 37.8
TCO2: 20 mmol/L (ref 0–100)
TCO2: 20 mmol/L (ref 0–100)
TCO2: 20 mmol/L (ref 0–100)
TCO2: 21 mmol/L (ref 0–100)
TCO2: 21 mmol/L (ref 0–100)
TCO2: 22 mmol/L (ref 0–100)
TCO2: 22 mmol/L (ref 0–100)
TCO2: 22 mmol/L (ref 0–100)
TCO2: 22 mmol/L (ref 0–100)
TCO2: 24 mmol/L (ref 0–100)
TCO2: 30 mmol/L (ref 0–100)
TCO2: 34 mmol/L (ref 0–100)
pCO2 arterial: 37 mmHg (ref 32.0–48.0)
pCO2 arterial: 34.7 mmHg (ref 32.0–48.0)
pCO2 arterial: 35.4 mmHg (ref 32.0–48.0)
pCO2 arterial: 36.6 mmHg (ref 32.0–48.0)
pCO2 arterial: 34.8 mmHg (ref 32.0–48.0)
pCO2 arterial: 71.1 mmHg (ref 32.0–48.0)
pH, Arterial: 7.341 — ABNORMAL LOW (ref 7.350–7.450)
pH, Arterial: 7.357 (ref 7.350–7.450)
pH, Arterial: 7.369 (ref 7.350–7.450)
pH, Arterial: 7.392 (ref 7.350–7.450)
pH, Arterial: 7.321 — ABNORMAL LOW (ref 7.350–7.450)
pO2, Arterial: 130 mmHg — ABNORMAL HIGH (ref 83.0–108.0)
pO2, Arterial: 131 mmHg — ABNORMAL HIGH (ref 83.0–108.0)
pO2, Arterial: 134 mmHg — ABNORMAL HIGH (ref 83.0–108.0)
pO2, Arterial: 162 mmHg — ABNORMAL HIGH (ref 83.0–108.0)

## 2016-12-23 LAB — BLOOD GAS, ARTERIAL
ACID-BASE EXCESS: 0.4 mmol/L (ref 0.0–2.0)
BICARBONATE: 25 mmol/L (ref 20.0–28.0)
DRAWN BY: 23588
FIO2: 0.5
O2 SAT: 96.3 %
PCO2 ART: 44.2 mmHg (ref 32.0–48.0)
PEEP/CPAP: 8 cmH2O
PH ART: 7.372 (ref 7.350–7.450)
Patient temperature: 98.6
RATE: 30 resp/min
VT: 550 mL
pO2, Arterial: 94.9 mmHg (ref 83.0–108.0)

## 2016-12-23 LAB — POCT I-STAT 3, VENOUS BLOOD GAS (G3P V)
ACID-BASE DEFICIT: 1 mmol/L (ref 0.0–2.0)
BICARBONATE: 25.1 mmol/L (ref 20.0–28.0)
O2 Saturation: 72 %
PO2 VEN: 39 mmHg (ref 32.0–45.0)
Patient temperature: 36.4
TCO2: 27 mmol/L (ref 0–100)
pCO2, Ven: 45.5 mmHg (ref 44.0–60.0)
pH, Ven: 7.348 (ref 7.250–7.430)

## 2016-12-23 LAB — COMPREHENSIVE METABOLIC PANEL
ALT: 1199 U/L — AB (ref 17–63)
ANION GAP: 13 (ref 5–15)
AST: 1082 U/L — ABNORMAL HIGH (ref 15–41)
Albumin: 2.2 g/dL — ABNORMAL LOW (ref 3.5–5.0)
Alkaline Phosphatase: 37 U/L — ABNORMAL LOW (ref 38–126)
BUN: 38 mg/dL — ABNORMAL HIGH (ref 6–20)
CHLORIDE: 106 mmol/L (ref 101–111)
CO2: 19 mmol/L — AB (ref 22–32)
Calcium: 6.9 mg/dL — ABNORMAL LOW (ref 8.9–10.3)
Creatinine, Ser: 1.75 mg/dL — ABNORMAL HIGH (ref 0.61–1.24)
GFR, EST AFRICAN AMERICAN: 53 mL/min — AB (ref >60)
GFR, EST NON AFRICAN AMERICAN: 45 mL/min — AB (ref >60)
Glucose, Bld: 151 mg/dL — ABNORMAL HIGH (ref 65–99)
Potassium: 4.6 mmol/L (ref 3.5–5.1)
Sodium: 138 mmol/L (ref 135–145)
Total Bilirubin: 0.9 mg/dL (ref 0.3–1.2)
Total Protein: 5.1 g/dL — ABNORMAL LOW (ref 6.5–8.1)

## 2016-12-23 LAB — CBC WITH DIFFERENTIAL/PLATELET
BASOS PCT: 0 %
Basophils Absolute: 0 10*3/uL (ref 0.0–0.1)
EOS PCT: 0 %
Eosinophils Absolute: 0 10*3/uL (ref 0.0–0.7)
HCT: 40.7 % (ref 39.0–52.0)
HEMOGLOBIN: 13.4 g/dL (ref 13.0–17.0)
LYMPHS PCT: 4 %
Lymphs Abs: 0.5 10*3/uL — ABNORMAL LOW (ref 0.7–4.0)
MCH: 27.8 pg (ref 26.0–34.0)
MCHC: 32.9 g/dL (ref 30.0–36.0)
MCV: 84.4 fL (ref 78.0–100.0)
MONOS PCT: 2 %
Monocytes Absolute: 0.2 10*3/uL (ref 0.1–1.0)
NEUTROS PCT: 94 %
Neutro Abs: 10.9 10*3/uL — ABNORMAL HIGH (ref 1.7–7.7)
PLATELETS: 38 10*3/uL — AB (ref 150–400)
RBC: 4.82 MIL/uL (ref 4.22–5.81)
RDW: 13.2 % (ref 11.5–15.5)
WBC MORPHOLOGY: INCREASED
WBC: 11.6 10*3/uL — AB (ref 4.0–10.5)

## 2016-12-23 LAB — PROTIME-INR
INR: 1.52
Prothrombin Time: 18.5 seconds — ABNORMAL HIGH (ref 11.4–15.2)

## 2016-12-23 LAB — APTT: aPTT: 41 seconds — ABNORMAL HIGH (ref 24–36)

## 2016-12-23 LAB — MAGNESIUM
MAGNESIUM: 1.7 mg/dL (ref 1.7–2.4)
MAGNESIUM: 1.8 mg/dL (ref 1.7–2.4)

## 2016-12-23 LAB — CULTURE, RESPIRATORY: CULTURE: NORMAL

## 2016-12-23 LAB — GLUCOSE, CAPILLARY
GLUCOSE-CAPILLARY: 137 mg/dL — AB (ref 65–99)
Glucose-Capillary: 126 mg/dL — ABNORMAL HIGH (ref 65–99)
Glucose-Capillary: 145 mg/dL — ABNORMAL HIGH (ref 65–99)
Glucose-Capillary: 129 mg/dL — ABNORMAL HIGH (ref 65–99)
Glucose-Capillary: 116 mg/dL — ABNORMAL HIGH (ref 65–99)
Glucose-Capillary: 99 mg/dL (ref 65–99)

## 2016-12-23 LAB — CULTURE, RESPIRATORY W GRAM STAIN

## 2016-12-23 LAB — FIBRINOGEN: Fibrinogen: 713 mg/dL — ABNORMAL HIGH (ref 210–475)

## 2016-12-23 LAB — PROCALCITONIN: PROCALCITONIN: 93.45 ng/mL

## 2016-12-23 LAB — PHOSPHORUS
PHOSPHORUS: 3.1 mg/dL (ref 2.5–4.6)
PHOSPHORUS: 4.2 mg/dL (ref 2.5–4.6)

## 2016-12-23 MED ORDER — VITAL AF 1.2 CAL PO LIQD
1000.0000 mL | ORAL | Status: DC
  Administered 2016-12-23 – 2016-12-28 (×7): 1000 mL

## 2016-12-23 MED ORDER — LACTULOSE 10 GM/15ML PO SOLN
30.0000 g | Freq: Two times a day (BID) | ORAL | Status: DC
  Administered 2016-12-23 – 2016-12-28 (×11): 30 g
  Filled 2016-12-23 (×11): qty 45

## 2016-12-23 MED ORDER — VITAL HIGH PROTEIN PO LIQD
1000.0000 mL | ORAL | Status: DC
  Administered 2016-12-23: 1000 mL

## 2016-12-23 MED ORDER — LABETALOL HCL 5 MG/ML IV SOLN
10.0000 mg | INTRAVENOUS | Status: DC | PRN
  Administered 2016-12-23: 10 mg via INTRAVENOUS
  Filled 2016-12-23 (×2): qty 4

## 2016-12-23 MED ORDER — CHLORHEXIDINE GLUCONATE 0.12 % MT SOLN
OROMUCOSAL | Status: AC
  Filled 2016-12-23: qty 15

## 2016-12-23 MED ORDER — LACTULOSE 10 GM/15ML PO SOLN
30.0000 g | Freq: Two times a day (BID) | ORAL | Status: DC
  Administered 2016-12-23: 30 g via ORAL
  Filled 2016-12-23: qty 45

## 2016-12-23 MED ORDER — ACETAMINOPHEN 160 MG/5ML PO SOLN
650.0000 mg | Freq: Once | ORAL | Status: AC
  Administered 2016-12-23: 650 mg
  Filled 2016-12-23: qty 20.3

## 2016-12-23 MED ORDER — CISATRACURIUM BOLUS VIA INFUSION
0.0500 mg/kg | Freq: Once | INTRAVENOUS | Status: AC
  Administered 2016-12-23: 4.2 mg via INTRAVENOUS
  Filled 2016-12-23: qty 5

## 2016-12-23 MED ORDER — OSELTAMIVIR PHOSPHATE 6 MG/ML PO SUSR
75.0000 mg | Freq: Two times a day (BID) | ORAL | Status: DC
  Administered 2016-12-23 – 2016-12-25 (×6): 75 mg
  Filled 2016-12-23 (×7): qty 12.5

## 2016-12-23 MED ORDER — PRO-STAT SUGAR FREE PO LIQD
30.0000 mL | Freq: Two times a day (BID) | ORAL | Status: DC
  Administered 2016-12-23 – 2016-12-28 (×11): 30 mL
  Filled 2016-12-23 (×11): qty 30

## 2016-12-23 MED ORDER — CISATRACURIUM BOLUS VIA INFUSION
0.0500 mg/kg | Freq: Once | INTRAVENOUS | Status: AC
Start: 2016-12-23 — End: 2016-12-23
  Administered 2016-12-23: 4.2 mg via INTRAVENOUS
  Filled 2016-12-23: qty 5

## 2016-12-23 MED ORDER — FUROSEMIDE 10 MG/ML IJ SOLN
40.0000 mg | Freq: Two times a day (BID) | INTRAMUSCULAR | Status: AC
  Administered 2016-12-23 – 2016-12-24 (×4): 40 mg via INTRAVENOUS
  Filled 2016-12-23 (×4): qty 4

## 2016-12-23 NOTE — Progress Notes (Signed)
Pharmacy Antibiotic Note  Troy Brennan is a 46 y.o. male admitted on 12/21/2016 with sepsis.   Patient's antibiotics have been adjusted to ceftazidime alone until cultures are assessed 2/17. Patient is also on tamiflu for flu B, renal function is improved will adjust dosing. Per MD, length of therapy is 10 days so will adjust stop time.   No fevers noted, wbc trending up to 11 this am, cultures are ngtd. Renal function is borderline, if scr worsens over weekend will adjust antibiotics/antivirals.   Plan: D/c Vancomycin Fortaz 1g q8 hours Tamiflu 75mg  bid x 8 more days for 10d total  Height: 6\' 1"  (185.4 cm) Weight: 185 lb 13.6 oz (84.3 kg) IBW/kg (Calculated) : 79.9  Temp (24hrs), Avg:98.1 F (36.7 C), Min:94.8 F (34.9 C), Max:99.5 F (37.5 C)   Recent Labs Lab 12/21/16 0540 12/21/16 0557 12/21/16 0616 12/21/16 0914 12/21/16 1408 12/21/16 2030 12/22/16 0420 12/23/16 0415  WBC  --  1.3*  --   --  2.0*  --  6.3 11.6*  CREATININE  --  2.44*  --  2.27*  --  1.98* 2.01* 1.75*  LATICACIDVEN 9.62*  --  8.4* 3.8*  --   --   --   --     Estimated Creatinine Clearance: 60.2 mL/min (by C-G formula based on SCr of 1.75 mg/dL (H)).    No Known Allergies  Antimicrobials this admission: Vanc 2/14>>2/16 Ceftriaxone 2/14>2/15 ceftaz 2/15> Tamiflu 2/14>>  Dose adjustments this admission: 2/16 Tamiflu increased to 75mg  bid  Microbiology results: 2/14- influB 2/14 MRSA PCR- neg 2/14 resp- GPC/GNR, yeast 2/14 urine-ng 2/14 blood x2-ngtd  Thank you for allowing pharmacy to be a part of this patient's care.  Sheppard Coil PharmD., BCPS Clinical Pharmacist Pager [phone] 12/23/2016 9:32 AM

## 2016-12-23 NOTE — Progress Notes (Signed)
PULMONARY / CRITICAL CARE MEDICINE   Name: Troy Brennan MRN: [id number] DOB: 1971/10/21    ADMISSION DATE:  12/21/2016 CONSULTATION DATE:  12/21/2016  REFERRING MD:  Dr. Blinda Leatherwood   CHIEF COMPLAINT: Cardiac Arrest  HISTORY OF PRESENT ILLNESS:  46 year old male with ETOH and polysubstance abuse, recently discharged from detox facility 8-10 days ago. Found in cardiac arrest, septic, normothermia, ards.  SUBJECTIVE:  Prone 16 hours, much improved ph and O2 needs Off levophed, on vaso LFt up  VITAL SIGNS: BP (!) 87/61   Pulse (!) 29   Temp 99 F (37.2 C) (Core (Comment))   Resp (!) 24   Wt 84.3 kg (185 lb 13.6 oz)   SpO2 (!) 74%   HEMODYNAMICS:    VENTILATOR SETTINGS: Vent Mode: PRVC FiO2 (%):  [50 %-70 %] 50 % Set Rate:  [30 bmp-35 bmp] 30 bmp Vt Set:  [550 mL] 550 mL PEEP:  [12 cmH20-15 cmH20] 12 cmH20 Plateau Pressure:  [24 cmH20-37 cmH20] 27 cmH20  INTAKE / OUTPUT: I/O last 3 completed shifts: In: 6224 [I.V.:5464; IV Piggyback:760] Out: 2020 [Urine:1820; Emesis/NG output:200]  PHYSICAL EXAMINATION: General: Adult male, not paralyzed, sedated prone Neuro: Sedated, paralyzed HEENT: ETT in place, left eye chronic, assessed after supine with left facial swelling, no ulceration or nercrosis on face Cardiovascular: S1/S2, tachy no MRG   Lungs: coarse but improved Abdomen: obese, soft, lower bowel sounds  Musculoskeletal: no deformities  Skin: modled to lower extremities livido, and knee caps  LABS:  BMET  Recent Labs Lab 12/21/16 2030 12/22/16 0420 12/23/16 0415  NA 137 139 138  K 3.8 4.7 4.6  CL 108 108 106  CO2 17* 17* 19*  BUN 27* 30* 38*  CREATININE 1.98* 2.01* 1.75*  GLUCOSE 152* 102* 151*    Electrolytes  Recent Labs Lab 12/21/16 2030 12/22/16 0420 12/23/16 0415  CALCIUM 6.6* 6.6* 6.9*  MG  --  1.6*  --   PHOS  --  5.3*  --     CBC  Recent Labs Lab 12/21/16 1408 12/22/16 0420 12/23/16 0415  WBC 2.0* 6.3 11.6*  HGB 15.2  15.0 13.4  HCT 48.3 46.6 40.7  PLT 65* 65* 38*    Coag's  Recent Labs Lab 12/21/16 0914 12/21/16 1848  APTT 38* 45*  INR 1.60 1.53    Sepsis Markers  Recent Labs Lab 12/21/16 0540 12/21/16 0616 12/21/16 0914 12/22/16 0420 12/23/16 0415  LATICACIDVEN 9.62* 8.4* 3.8*  --   --   PROCALCITON  --   --  49.60 144.50 93.45    ABG  Recent Labs Lab 12/23/16 0431 12/23/16 0511 12/23/16 0622  PHART 7.363 7.369 7.395  PCO2ART 36.6 35.4 34.8  PO2ART 134.0* 145.0* 162.0*    Liver Enzymes  Recent Labs Lab 12/21/16 0557 12/23/16 0415  AST 324* 1,082*  ALT 282* 1,199*  ALKPHOS 46 37*  BILITOT 0.4 0.9  ALBUMIN 3.0* 2.2*    Cardiac Enzymes  Recent Labs Lab 12/21/16 1408 12/21/16 2030 12/22/16 0414  TROPONINI 0.21* 0.18* 0.18*    Glucose  Recent Labs Lab 12/22/16 1235 12/22/16 1543 12/22/16 2011 12/23/16 0029 12/23/16 0429 12/23/16 0803  GLUCAP 78 104* 121* 137* 126* 145*    Imaging Dg Chest Port 1 View  Result Date: 12/23/2016 CLINICAL DATA:  Acute respiratory failure EXAM: PORTABLE CHEST 1 VIEW COMPARISON:  12/22/2016 FINDINGS: Cardiac shadow is stable. Endotracheal tube, nasogastric catheter and left jugular central line are again seen. The left jugular catheter appears to have  withdrawn slightly to the proximal SVC. Diffuse bilateral infiltrates are seen relatively stable given some technical variations in the imaging. Feeding catheter is noted extending to the stomach. No acute abnormality is noted. IMPRESSION: Stable appearance of the chest when compare with the prior exam with the exception of slight withdrawal of the left jugular catheter. Electronically Signed   By: Alcide Clever M.D.   On: 12/23/2016 07:04   Dg Abd Portable 1v  Result Date: 12/22/2016 CLINICAL DATA:  Check feeding catheter placement EXAM: PORTABLE ABDOMEN - 1 VIEW COMPARISON:  None. FINDINGS: Feeding catheter is been placed and lies within the fourth portion of the duodenum. No  obstructive changes are seen. A nasogastric catheter is coiled within the stomach. IMPRESSION: Feeding catheter within the distal duodenum. Electronically Signed   By: Alcide Clever M.D.   On: 12/22/2016 12:26     STUDIES:  CXR 2/14 > Diffuse bilateral airspace infiltrates, ? Edema, PNA, or Hemorrhagic Consolidation   CT Head 2/14 > neg 2/15 echo>>>15-20 diffuse low EF  CULTURES: Blood 2/14 > Sputum 2/14 > Urine 2/14 >  RVP 2/14 >POS flu B  ANTIBIOTICS: Vancomycin 2/14 >>>2/16 Rocephin 2/14 >>> Tamiflu 2/14 >>>   SIGNIFICANT EVENTS: 2/14 > Presents to ED after being found pulseless/apneic at home 2/15 prone  LINES/TUBES: ETT 2/14 >>  2/14 left IJ>>> 2 /14 a line rt rad>>>  DISCUSSION: 46 year old male with known ETOH and polysubstance abuse. Discharged from 8-10 days ago from Detox facility. For the last 3 days has had fever, chills, cough, and weakness, this morning girlfriend found him unresponsive and apneic. First Responders report 5-10 minutes of CPR before ROSC. ?Aspiration event vs CAP.    ASSESSMENT / PLAN:  PULMONARY A: Acute Respiratory Failure Flu pneumonitis ARDS Likely super infection bacterial PNA P:   ARDS protocol, keep plat less 30  Prone successful at 16 hours, to supine then repeat ABG in 4 hours and decide on needs re prone abg this am improved, to peep 8 as goal Keep same MV Dc bicarb drip as ph greater 7.25 pcxr in am Consider lasix, was again pos 2 liters in setting ards, pressors major improved Low steroids for pneumonitis from flu  CARDIOVASCULAR A:  Cardiac Arrest  Septic Shock Septic cardiomyopathy vs etoh related P:  cvp 15- ards = lasix Vaso to off if map 65 Levo remains off Echo reviewed - add lasix Obtain svo2  RENAL A:   Acute Kidney Injury , ATN P:   Dc bicarb kvo concetrate fluids Lasix Chem in am   GASTROINTESTINAL A:   ARDS Shock liver in setting etoh P:   NPO PPI  Place post pyloric done- feed even with  prone LFT follow up Get hep panel Limit versed drip when able  HEMATOLOGIC A:   Leukopenia improved Thrombocytopenia ( sepsis, mods, liver dz) P:  Trend CBC in am  scd No bleeding no role plat transfusion Repeat coags, fibrinogen to ensure no dic May need factor 8 level  INFECTIOUS A:   Flu B pneumonitis with presumed bacterial PNA Aspiration? PCT down P:   Maintain ceftaz, if cultur neg in 24 hours will go back to ctx Dc vanc Keep tamiflu , dose adjusted for renal failure, plan 10 days  ENDOCRINE A:   Hypoglycemia improved P:   d5 in fluids, adding TF  NEUROLOGIC A:   S/p arrest, at risk anoxia Metabolic Encephalopathy   H/O ETOH and polysubstance abuse  >Ethyl level <5 P:  In 4 hours if no prone planned, then wean versed with LFT Add lactulose likely  fent Consider dc paralysis if no prone   FAMILY  - Updates updated mom 2/15, she is making decisions  - Inter-disciplinary family meet or Palliative Care meeting due by: 2/21  Ccm time 45 min   Mcarthur Rossetti. Tyson Alias, MD, FACP Pgr: [phone] [facility] 12/23/2016 8:32 AM

## 2016-12-23 NOTE — Progress Notes (Signed)
eLink Physician-Brief Progress Note Patient Name: Troy Brennan DOB: 12/16/1970 MRN: [id number]   Date of Service  12/23/2016  HPI/Events of Note  Ventilator Dyssynchrony - Looks well sedated.   eICU Interventions  Will bolus with Nimbex from previous infusion X 1 and follow clinically.      Intervention Category Major Interventions: Other:  Lenell Antu 12/23/2016, 8:43 PM

## 2016-12-23 NOTE — Progress Notes (Signed)
Nutrition Follow-up  INTERVENTION:   Vital AF 1.2 @ 65 ml/hr (1560 ml/day) 30 ml Prostat BID Provides: 2072 kcal, 147 grams protein, and 1265 ml free water.    NUTRITION DIAGNOSIS:   Inadequate oral intake related to inability to eat as evidenced by NPO status. Ongoing.   GOAL:   Patient will meet greater than or equal to 90% of their needs Progressing.   MONITOR:   I & O's, Vent status  REASON FOR ASSESSMENT:   Consult Enteral/tube feeding initiation and management  ASSESSMENT:   Pt with PMH of ETOH and polysubstance abuse, recently discharged from detox facility 8-10 days ago. Found in cardiac arrest, septic, normothermia, ards.  2/15 Prone, Cortrak placed post pyloric Per MD notes likely super infection bacterial PNA, +ARDS  Patient is currently intubated on ventilator support MV: 11.9 L/min Temp (24hrs), Avg:98.8 F (37.1 C), Min:97.2 F (36.2 C), Max:100.4 F (38 C)   Diet Order:  Diet NPO time specified  Skin:  Reviewed, no issues  Last BM:  2/16  Height:   Ht Readings from Last 1 Encounters:  12/23/16 6\' 1"  (1.854 m)    Weight:   Wt Readings from Last 1 Encounters:  12/21/16 185 lb 13.6 oz (84.3 kg)    Ideal Body Weight:  83.6 kg  BMI:  There is no height or weight on file to calculate BMI.  Estimated Nutritional Needs:   Kcal:  2149  Protein:  125-140 grams  Fluid:  > 2L/day  EDUCATION NEEDS:   No education needs identified at this time  Kendell Bane RD, LDN, CNSC [phone] Pager [phone] After Hours Pager

## 2016-12-23 NOTE — Progress Notes (Signed)
Elink notified of low platelet count, high AST, ALT, and low calcium.  RN will continue to monitor patient and assess for new orders.

## 2016-12-24 ENCOUNTER — Inpatient Hospital Stay (HOSPITAL_COMMUNITY): Payer: Self-pay

## 2016-12-24 LAB — CBC WITH DIFFERENTIAL/PLATELET
Basophils Absolute: 0 10*3/uL (ref 0.0–0.1)
Basophils Relative: 0 %
Eosinophils Absolute: 0 10*3/uL (ref 0.0–0.7)
Eosinophils Relative: 0 %
HEMATOCRIT: 37 % — AB (ref 39.0–52.0)
HEMOGLOBIN: 12.4 g/dL — AB (ref 13.0–17.0)
LYMPHS PCT: 7 %
Lymphs Abs: 0.7 10*3/uL (ref 0.7–4.0)
MCH: 28.3 pg (ref 26.0–34.0)
MCHC: 33.5 g/dL (ref 30.0–36.0)
MCV: 84.5 fL (ref 78.0–100.0)
MONOS PCT: 6 %
Monocytes Absolute: 0.6 10*3/uL (ref 0.1–1.0)
NEUTROS PCT: 87 %
Neutro Abs: 8.9 10*3/uL — ABNORMAL HIGH (ref 1.7–7.7)
Platelets: 32 10*3/uL — ABNORMAL LOW (ref 150–400)
RBC: 4.38 MIL/uL (ref 4.22–5.81)
RDW: 13.3 % (ref 11.5–15.5)
WBC: 10.2 10*3/uL (ref 4.0–10.5)

## 2016-12-24 LAB — PHOSPHORUS: PHOSPHORUS: 2.3 mg/dL — AB (ref 2.5–4.6)

## 2016-12-24 LAB — CBC
HCT: 35.8 % — ABNORMAL LOW (ref 39.0–52.0)
Hemoglobin: 12 g/dL — ABNORMAL LOW (ref 13.0–17.0)
MCH: 28.2 pg (ref 26.0–34.0)
MCHC: 33.5 g/dL (ref 30.0–36.0)
MCV: 84.2 fL (ref 78.0–100.0)
PLATELETS: 29 10*3/uL — AB (ref 150–400)
RBC: 4.25 MIL/uL (ref 4.22–5.81)
RDW: 13.1 % (ref 11.5–15.5)
WBC: 13 10*3/uL — AB (ref 4.0–10.5)

## 2016-12-24 LAB — POCT I-STAT 3, ART BLOOD GAS (G3+)
ACID-BASE EXCESS: 10 mmol/L — AB (ref 0.0–2.0)
Acid-Base Excess: 5 mmol/L — ABNORMAL HIGH (ref 0.0–2.0)
Acid-Base Excess: 5 mmol/L — ABNORMAL HIGH (ref 0.0–2.0)
Acid-Base Excess: 5 mmol/L — ABNORMAL HIGH (ref 0.0–2.0)
BICARBONATE: 29.9 mmol/L — AB (ref 20.0–28.0)
BICARBONATE: 34.5 mmol/L — AB (ref 20.0–28.0)
Bicarbonate: 29.6 mmol/L — ABNORMAL HIGH (ref 20.0–28.0)
Bicarbonate: 31.5 mmol/L — ABNORMAL HIGH (ref 20.0–28.0)
O2 SAT: 95 %
O2 SAT: 93 %
O2 Saturation: 94 %
O2 Saturation: 96 %
PCO2 ART: 44.4 mmHg (ref 32.0–48.0)
PCO2 ART: 52.5 mmHg — AB (ref 32.0–48.0)
PH ART: 7.434 (ref 7.350–7.450)
PH ART: 7.444 (ref 7.350–7.450)
PH ART: 7.387 (ref 7.350–7.450)
PO2 ART: 67 mmHg — AB (ref 83.0–108.0)
Patient temperature: 37.1
TCO2: 31 mmol/L (ref 0–100)
TCO2: 31 mmol/L (ref 0–100)
TCO2: 33 mmol/L (ref 0–100)
TCO2: 36 mmol/L (ref 0–100)
pCO2 arterial: 42.9 mmHg (ref 32.0–48.0)
pCO2 arterial: 44.9 mmHg (ref 32.0–48.0)
pH, Arterial: 7.492 — ABNORMAL HIGH (ref 7.350–7.450)
pO2, Arterial: 80 mmHg — ABNORMAL LOW (ref 83.0–108.0)
pO2, Arterial: 80 mmHg — ABNORMAL LOW (ref 83.0–108.0)
pO2, Arterial: 62 mmHg — ABNORMAL LOW (ref 83.0–108.0)

## 2016-12-24 LAB — BLOOD GAS, ARTERIAL
ACID-BASE EXCESS: 8.8 mmol/L — AB (ref 0.0–2.0)
Bicarbonate: 33.4 mmol/L — ABNORMAL HIGH (ref 20.0–28.0)
Drawn by: 42624
FIO2: 0.5
LHR: 30 {breaths}/min
O2 SAT: 94.4 %
PCO2 ART: 51.9 mmHg — AB (ref 32.0–48.0)
PEEP: 5 cmH2O
Patient temperature: 98.6
VT: 550 mL
pH, Arterial: 7.425 (ref 7.350–7.450)
pO2, Arterial: 75.5 mmHg — ABNORMAL LOW (ref 83.0–108.0)

## 2016-12-24 LAB — COMPREHENSIVE METABOLIC PANEL
ALT: 723 U/L — ABNORMAL HIGH (ref 17–63)
ANION GAP: 11 (ref 5–15)
AST: 468 U/L — ABNORMAL HIGH (ref 15–41)
Albumin: 2.2 g/dL — ABNORMAL LOW (ref 3.5–5.0)
Alkaline Phosphatase: 48 U/L (ref 38–126)
BILIRUBIN TOTAL: 0.9 mg/dL (ref 0.3–1.2)
BUN: 55 mg/dL — ABNORMAL HIGH (ref 6–20)
CO2: 28 mmol/L (ref 22–32)
Calcium: 7.6 mg/dL — ABNORMAL LOW (ref 8.9–10.3)
Chloride: 102 mmol/L (ref 101–111)
Creatinine, Ser: 1.68 mg/dL — ABNORMAL HIGH (ref 0.61–1.24)
GFR calc Af Amer: 55 mL/min — ABNORMAL LOW (ref >60)
GFR, EST NON AFRICAN AMERICAN: 48 mL/min — AB (ref >60)
Glucose, Bld: 167 mg/dL — ABNORMAL HIGH (ref 65–99)
POTASSIUM: 4 mmol/L (ref 3.5–5.1)
Sodium: 141 mmol/L (ref 135–145)
Total Protein: 5.3 g/dL — ABNORMAL LOW (ref 6.5–8.1)

## 2016-12-24 LAB — TRIGLYCERIDES: Triglycerides: 201 mg/dL — ABNORMAL HIGH (ref <150)

## 2016-12-24 LAB — HEPATITIS PANEL, ACUTE
HEP A IGM: NEGATIVE
Hep B C IgM: NEGATIVE
Hepatitis B Surface Ag: NEGATIVE

## 2016-12-24 LAB — GLUCOSE, CAPILLARY
GLUCOSE-CAPILLARY: 137 mg/dL — AB (ref 65–99)
Glucose-Capillary: 112 mg/dL — ABNORMAL HIGH (ref 65–99)
Glucose-Capillary: 127 mg/dL — ABNORMAL HIGH (ref 65–99)
Glucose-Capillary: 167 mg/dL — ABNORMAL HIGH (ref 65–99)
Glucose-Capillary: 173 mg/dL — ABNORMAL HIGH (ref 65–99)
Glucose-Capillary: 175 mg/dL — ABNORMAL HIGH (ref 65–99)

## 2016-12-24 LAB — MAGNESIUM: Magnesium: 2 mg/dL (ref 1.7–2.4)

## 2016-12-24 MED ORDER — PROPOFOL 1000 MG/100ML IV EMUL
0.0000 ug/kg/min | INTRAVENOUS | Status: DC
  Administered 2016-12-24: 20 ug/kg/min via INTRAVENOUS
  Administered 2016-12-24: 5 ug/kg/min via INTRAVENOUS
  Administered 2016-12-24 – 2016-12-25 (×7): 50 ug/kg/min via INTRAVENOUS
  Administered 2016-12-25: 40 ug/kg/min via INTRAVENOUS
  Administered 2016-12-25 – 2016-12-26 (×4): 50 ug/kg/min via INTRAVENOUS
  Filled 2016-12-24 (×12): qty 100
  Filled 2016-12-24: qty 200
  Filled 2016-12-24 (×2): qty 100

## 2016-12-24 MED ORDER — DOCUSATE SODIUM 50 MG/5ML PO LIQD: 100.0000 mg | Freq: Two times a day (BID) | ORAL | Status: DC | PRN

## 2016-12-24 NOTE — Progress Notes (Addendum)
Notified MD of continued shivering & low water temperatures.  Received orders to continue to increase sedation (Propofol) to quell shivering.  Will continue to monitor. [place], Mitzi Hansen

## 2016-12-24 NOTE — Progress Notes (Signed)
PULMONARY / CRITICAL CARE MEDICINE   Name: Troy Brennan MRN: [id number] DOB: 1971/10/27    ADMISSION DATE:  12/21/2016 CONSULTATION DATE:  12/21/2016  REFERRING MD:  Dr. Blinda Leatherwood   CHIEF COMPLAINT: Cardiac Arrest  HISTORY OF PRESENT ILLNESS:  46 year old male with ETOH and polysubstance abuse, recently discharged from detox facility 8-10 days ago. Found in cardiac arrest, septic, normothermia, ards.  SUBJECTIVE:  Episodes of shivering overnight, vent dyssynchrony > paralytics x 2, propofol added.   VITAL SIGNS: BP (!) 88/54   Pulse 84   Temp 99 F (37.2 C) (Core (Comment))   Resp (!) 27   Ht 6\' 1"  (1.854 m)   Wt 75.5 kg (166 lb 7.2 oz)   SpO2 97%   BMI 21.96 kg/m   HEMODYNAMICS:    VENTILATOR SETTINGS: Vent Mode: PRVC FiO2 (%):  [50 %] 50 % Set Rate:  [30 bmp] 30 bmp Vt Set:  [550 mL] 550 mL PEEP:  [5 cmH20-12 cmH20] 5 cmH20 Plateau Pressure:  [16 cmH20-38 cmH20] 21 cmH20  INTAKE / OUTPUT: I/O last 3 completed shifts: In: 4162.4 [I.V.:2672.6; Other:80; NG/GT:959.8; IV Piggyback:450] Out: 6400 [Urine:5925; Emesis/NG output:300; Stool:175]  PHYSICAL EXAMINATION: General: ill appearing, ventilated, supine on MV, cooling active Neuro: deep sedation, no interaction with stim, paralytics off, not shivering currently HEENT: OP clear, ETT in place, no facial lesions Cardiovascular: Regular, 80, no M Lungs: coarse B BS Abdomen: soft, NT, benign, hypoactive BS Musculoskeletal: no lesions deformities Skin: cool, no rash  LABS:  BMET  Recent Labs Lab 12/22/16 0420 12/23/16 0415 12/24/16 0415  NA 139 138 141  K 4.7 4.6 4.0  CL 108 106 102  CO2 17* 19* 28  BUN 30* 38* 55*  CREATININE 2.01* 1.75* 1.68*  GLUCOSE 102* 151* 167*    Electrolytes  Recent Labs Lab 12/22/16 0420 12/23/16 0415 12/23/16 1027 12/23/16 1830 12/24/16 0415  CALCIUM 6.6* 6.9*  --   --  7.6*  MG 1.6*  --  1.7 1.8 2.0  PHOS 5.3*  --  3.1 4.2 2.3*    CBC  Recent  Labs Lab 12/22/16 0420 12/23/16 0415 12/24/16 0415  WBC 6.3 11.6* 10.2  HGB 15.0 13.4 12.4*  HCT 46.6 40.7 37.0*  PLT 65* 38* 32*    Coag's  Recent Labs Lab 12/21/16 0914 12/21/16 1848 12/23/16 1027  APTT 38* 45* 41*  INR 1.60 1.53 1.52    Sepsis Markers  Recent Labs Lab 12/21/16 0540 12/21/16 0616 12/21/16 0914 12/22/16 0420 12/23/16 0415  LATICACIDVEN 9.62* 8.4* 3.8*  --   --   PROCALCITON  --   --  49.60 144.50 93.45    ABG  Recent Labs Lab 12/23/16 2015 12/24/16 0256 12/24/16 0422  PHART 7.257* 7.434 7.444  PCO2ART 71.1* 44.4 42.9  PO2ART 100.0 67.0* 80.0*    Liver Enzymes  Recent Labs Lab 12/21/16 0557 12/23/16 0415 12/24/16 0415  AST 324* 1,082* 468*  ALT 282* 1,199* 723*  ALKPHOS 46 37* 48  BILITOT 0.4 0.9 0.9  ALBUMIN 3.0* 2.2* 2.2*    Cardiac Enzymes  Recent Labs Lab 12/21/16 1408 12/21/16 2030 12/22/16 0414  TROPONINI 0.21* 0.18* 0.18*    Glucose  Recent Labs Lab 12/23/16 1133 12/23/16 1642 12/23/16 1950 12/24/16 0005 12/24/16 0415 12/24/16 0804  GLUCAP 129* 116* 99 112* 173* 127*    Imaging Dg Chest Port 1 View  Result Date: 12/24/2016 CLINICAL DATA:  Hypoxia EXAM: PORTABLE CHEST 1 VIEW COMPARISON:  December 23, 2016 FINDINGS: Endotracheal  tube tip is 2.5 cm above the carina. Central catheter tip is in superior vena cava. Nasogastric tube tip and side-port in the stomach. There is also an apparent feeding tube with tip below the diaphragm. No pneumothorax. There is alveolar opacity in both perihilar regions, significantly increased on the left and essentially stable on the right. Heart size is normal. There is evidence of pulmonary venous hypertension. IMPRESSION: Tube and catheter positions as described without pneumothorax. Perihilar opacity bilaterally, significantly increased on the left and essentially stable on the right. Question progression of pulmonary edema versus progression of pneumonia. Both entities may  exist concurrently. Stable cardiac silhouette. Note that there is a degree of pulmonary venous hypertension. Electronically Signed   By: Bretta Bang III M.D.   On: 12/24/2016 07:41     STUDIES:  CXR 2/14 > Diffuse bilateral airspace infiltrates, ? Edema, PNA, or Hemorrhagic Consolidation   CT Head 2/14 > neg 2/15 echo>>>15-20 diffuse low EF Hepatitis panel >>   CULTURES: Blood 2/14 > Sputum 2/14 > normal flora Urine 2/14 > negative RVP 2/14 >POS flu B  ANTIBIOTICS: Vancomycin 2/14 >>>2/16 Rocephin 2/14 >>> 2/15 ceftaz 2/15 >>  Tamiflu 2/14 >>>   SIGNIFICANT EVENTS: 2/14 > Presents to ED after being found pulseless/apneic at home 2/15 prone 2/17 supine, normothermia ongoing, PEEP / FiO2 better  LINES/TUBES: ETT 2/14 >>  2/14 left IJ>>> 2 /14 a line rt rad>>>  DISCUSSION: 46 year old male with known ETOH and polysubstance abuse. Discharged from 8-10 days ago from Detox facility. For the last 3 days has had fever, chills, cough, and weakness, this morning girlfriend found him unresponsive and apneic. First Responders report 5-10 minutes of CPR before ROSC. ?Aspiration event vs CAP.  Flu positive  ASSESSMENT / PLAN:  PULMONARY A: Acute Respiratory Failure Flu pneumonitis ARDS Likely super infection bacterial PNA P:   Continue low Vt vent strategy No indication to re-prone at this time Sedation adjustments for vent synchrony as below Follow CXR Start low dose lasix 2/17, FACTT goals Continue solumedrol for presumed flu pneumonitis abx as below   CARDIOVASCULAR A:  Cardiac Arrest  Septic Shock, improved Septic cardiomyopathy vs etoh related P:  Continue lasix as ordered Pressors off Consider repeat TTE after acute illness, ? Cardiac stunning vs more longstanding dysfxn  RENAL A:   Acute Kidney Injury , ATN, improved P:   continue lasix, re-dose 2/18 based on status Follow BMP  GASTROINTESTINAL A:   ARDS Shock liver in setting etoh, LFT have  plateaued P:   NPO PPI  Continue TF Continue lactulose Hepatitis panel pending >>    HEMATOLOGIC A:   Leukopenia, improved Thrombocytopenia (sepsis, mods, liver dz) P:  Follow CBC No indication transfuse plt at this itme Fibrinogen high on 2/17, doubt DIC  INFECTIOUS A:   Flu B pneumonitis presumed bacterial PNA Aspiration? P:   Continue ceftax for now, adjust based on cx data Continue tamiflu, plan for 10 days. ? Increase dose with improving S Cr > will discuss with Pharmacy  ENDOCRINE A:   Hypoglycemia improved P:   Follow CBG on TF and steroids  NEUROLOGIC A:   S/p arrest, at risk anoxic injury Metabolic Encephalopathy   H/O ETOH and polysubstance abuse  >Ethyl level <5 P:  Off paralytics D/c versed and continue propofol for now, goal to off soon given improved oxygenation and completion of normothermia / cooling Fentanyl gtt Continue empiric lactulose for now.  Folate and thiamine, to end on 2/17  FAMILY  - Updates updated mom 2/15, she is making decisions. No family present 2/17  - Inter-disciplinary family meet or Palliative Care meeting due by: 2/21   Independent CC time 35 minutes  Levy Pupa, MD, PhD 12/24/2016, 8:32 AM [facility] [phone] or if no answer [phone]

## 2016-12-24 NOTE — Progress Notes (Signed)
eLink Physician-Brief Progress Note Patient Name: Troy Brennan DOB: 1971-07-20 MRN: [id number]   Date of Service  12/24/2016  HPI/Events of Note  Patient uncomfortable and with episodes of the synchrony with the vent.  Status post arrest. Being rewarmed.   Currently on fentanyl drip. 400 mcg/m.  Currently on Versed drip, 1 mg an hour. Patient with liver failure.   On 50% FiO2, O2 saturation 95%. Blood pressure 160/70, heart rate 101, respiratory rate 14.   eICU Interventions  Will try propofol drip on top of fentanyl drip. Plan to wean off Versed drip.      Intervention Category Major Interventions: Other:  Louann Sjogren 12/24/2016, 12:35 AM

## 2016-12-25 ENCOUNTER — Inpatient Hospital Stay (HOSPITAL_COMMUNITY): Payer: Self-pay

## 2016-12-25 LAB — CBC
HEMATOCRIT: 37.7 % — AB (ref 39.0–52.0)
HEMOGLOBIN: 12.7 g/dL — AB (ref 13.0–17.0)
MCH: 28.3 pg (ref 26.0–34.0)
MCHC: 33.7 g/dL (ref 30.0–36.0)
MCV: 84.2 fL (ref 78.0–100.0)
Platelets: 31 10*3/uL — ABNORMAL LOW (ref 150–400)
RBC: 4.48 MIL/uL (ref 4.22–5.81)
RDW: 13 % (ref 11.5–15.5)
WBC: 17.1 10*3/uL — ABNORMAL HIGH (ref 4.0–10.5)

## 2016-12-25 LAB — COMPREHENSIVE METABOLIC PANEL
ALK PHOS: 59 U/L (ref 38–126)
ALT: 528 U/L — AB (ref 17–63)
AST: 242 U/L — AB (ref 15–41)
Albumin: 2.2 g/dL — ABNORMAL LOW (ref 3.5–5.0)
Anion gap: 8 (ref 5–15)
BUN: 58 mg/dL — AB (ref 6–20)
CHLORIDE: 104 mmol/L (ref 101–111)
CO2: 32 mmol/L (ref 22–32)
CREATININE: 1.27 mg/dL — AB (ref 0.61–1.24)
Calcium: 8 mg/dL — ABNORMAL LOW (ref 8.9–10.3)
GFR calc Af Amer: >60 mL/min (ref >60)
GFR calc non Af Amer: >60 mL/min (ref >60)
GLUCOSE: 159 mg/dL — AB (ref 65–99)
Potassium: 3.5 mmol/L (ref 3.5–5.1)
SODIUM: 144 mmol/L (ref 135–145)
Total Bilirubin: 0.8 mg/dL (ref 0.3–1.2)
Total Protein: 5.6 g/dL — ABNORMAL LOW (ref 6.5–8.1)

## 2016-12-25 LAB — GLUCOSE, CAPILLARY
Glucose-Capillary: 119 mg/dL — ABNORMAL HIGH (ref 65–99)
Glucose-Capillary: 122 mg/dL — ABNORMAL HIGH (ref 65–99)
Glucose-Capillary: 139 mg/dL — ABNORMAL HIGH (ref 65–99)
Glucose-Capillary: 147 mg/dL — ABNORMAL HIGH (ref 65–99)
Glucose-Capillary: 163 mg/dL — ABNORMAL HIGH (ref 65–99)
Glucose-Capillary: 176 mg/dL — ABNORMAL HIGH (ref 65–99)

## 2016-12-25 LAB — MAGNESIUM: Magnesium: 2.5 mg/dL — ABNORMAL HIGH (ref 1.7–2.4)

## 2016-12-25 LAB — PHOSPHORUS: Phosphorus: 2 mg/dL — ABNORMAL LOW (ref 2.5–4.6)

## 2016-12-25 LAB — PROTIME-INR
INR: 1.14
Prothrombin Time: 14.7 seconds (ref 11.4–15.2)

## 2016-12-25 MED ORDER — SODIUM CHLORIDE 0.9 % IV SOLN
INTRAVENOUS | Status: DC | PRN
  Administered 2016-12-25: 23:00:00 via INTRAVENOUS

## 2016-12-25 MED ORDER — FUROSEMIDE 10 MG/ML IJ SOLN
40.0000 mg | Freq: Every day | INTRAMUSCULAR | Status: DC
  Administered 2016-12-25 – 2016-12-26 (×2): 40 mg via INTRAVENOUS
  Filled 2016-12-25 (×2): qty 4

## 2016-12-25 MED ORDER — IBUPROFEN 100 MG/5ML PO SUSP
400.0000 mg | Freq: Four times a day (QID) | ORAL | Status: DC | PRN
  Administered 2016-12-25 – 2016-12-26 (×3): 400 mg
  Filled 2016-12-25 (×4): qty 20

## 2016-12-25 MED ORDER — METHYLPREDNISOLONE SODIUM SUCC 40 MG IJ SOLR
40.0000 mg | Freq: Every day | INTRAMUSCULAR | Status: DC
  Administered 2016-12-26: 40 mg via INTRAVENOUS
  Filled 2016-12-25: qty 1

## 2016-12-25 NOTE — Progress Notes (Signed)
eLink Physician-Brief Progress Note Patient Name: Troy Brennan DOB: 08-Mar-1971 MRN: [id number]   Date of Service  12/25/2016  HPI/Events of Note  Due to come off cooling pads at 4 PM. Nurse concerned that patient will spike temp when off cooling pads. Creatinine = 1.27.  eICU Interventions  Will order: 1. Motrin Suspension 400 mg per tube Q 6 hours PRN Temp > 100.5 F.     Intervention Category Major Interventions: Other:  Lenell Antu 12/25/2016, 3:26 PM

## 2016-12-25 NOTE — Progress Notes (Signed)
PULMONARY / CRITICAL CARE MEDICINE   Name: Qadree Florey MRN: [id number] DOB: 1970-11-20    ADMISSION DATE:  12/21/2016 CONSULTATION DATE:  12/21/2016  REFERRING MD:  Dr. Blinda Leatherwood   CHIEF COMPLAINT: Cardiac Arrest  HISTORY OF PRESENT ILLNESS:  46 year old male with ETOH and polysubstance abuse, recently discharged from detox facility 8-10 days ago. Found in cardiac arrest, septic, normothermia, ards.  SUBJECTIVE:  Has had periods of decrease water temp, probably trying to spike fever.  shivering during these episodes.   VITAL SIGNS: BP (!) 107/57   Pulse 72   Temp 98.2 F (36.8 C) (Core (Comment))   Resp (!) 30   Ht 6\' 1"  (1.854 m)   Wt 90 kg (198 lb 6.6 oz)   SpO2 96%   BMI 26.18 kg/m   HEMODYNAMICS:    VENTILATOR SETTINGS: Vent Mode: PRVC FiO2 (%):  [50 %] 50 % Set Rate:  [30 bmp] 30 bmp Vt Set:  [550 mL] 550 mL PEEP:  [5 cmH20] 5 cmH20 Plateau Pressure:  [15 cmH20-21 cmH20] 21 cmH20  INTAKE / OUTPUT: I/O last 3 completed shifts: In: 4246.7 [I.V.:1311.9; Other:120; SR/PR:9458.5; IV Piggyback:200] Out: 8525 [Urine:7800; Emesis/NG output:100; Stool:625]  PHYSICAL EXAMINATION: General: intubated and sedated, cooling in place Neuro: deeply sedated, no reaction to voice, moves to pain,  HEENT: ETT in place, OP clear, no facial lesions Cardiovascular: RRR, no M Lungs: coarse B BS Abdomen: soft, benign Musculoskeletal: no deformities Skin: no rash, coll  LABS:  BMET  Recent Labs Lab 12/23/16 0415 12/24/16 0415 12/25/16 0404  NA 138 141 144  K 4.6 4.0 3.5  CL 106 102 104  CO2 19* 28 32  BUN 38* 55* 58*  CREATININE 1.75* 1.68* 1.27*  GLUCOSE 151* 167* 159*    Electrolytes  Recent Labs Lab 12/23/16 0415  12/23/16 1830 12/24/16 0415 12/25/16 0404  CALCIUM 6.9*  --   --  7.6* 8.0*  MG  --   < > 1.8 2.0 2.5*  PHOS  --   < > 4.2 2.3* 2.0*  < > = values in this interval not displayed.  CBC  Recent Labs Lab 12/24/16 0415  12/24/16 1807 12/25/16 0404  WBC 10.2 13.0* 17.1*  HGB 12.4* 12.0* 12.7*  HCT 37.0* 35.8* 37.7*  PLT 32* 29* 31*    Coag's  Recent Labs Lab 12/21/16 0914 12/21/16 1848 12/23/16 1027 12/25/16 0404  APTT 38* 45* 41*  --   INR 1.60 1.53 1.52 1.14    Sepsis Markers  Recent Labs Lab 12/21/16 0540 12/21/16 0616 12/21/16 0914 12/22/16 0420 12/23/16 0415  LATICACIDVEN 9.62* 8.4* 3.8*  --   --   PROCALCITON  --   --  49.60 144.50 93.45    ABG  Recent Labs Lab 12/24/16 1642 12/24/16 2200 12/25/16 0330  PHART 7.492* 7.425 7.465*  PCO2ART 44.9 51.9* 48.3*  PO2ART 62.0* 75.5* 73.0*    Liver Enzymes  Recent Labs Lab 12/23/16 0415 12/24/16 0415 12/25/16 0404  AST 1,082* 468* 242*  ALT 1,199* 723* 528*  ALKPHOS 37* 48 59  BILITOT 0.9 0.9 0.8  ALBUMIN 2.2* 2.2* 2.2*    Cardiac Enzymes  Recent Labs Lab 12/21/16 1408 12/21/16 2030 12/22/16 0414  TROPONINI 0.21* 0.18* 0.18*    Glucose  Recent Labs Lab 12/24/16 0804 12/24/16 1215 12/24/16 1548 12/24/16 2006 12/25/16 0007 12/25/16 0412  GLUCAP 127* 137* 175* 167* 119* 147*    Imaging Dg Chest Port 1 View  Result Date: 12/25/2016 CLINICAL DATA:  Acute respiratory failure EXAM: PORTABLE CHEST 1 VIEW COMPARISON:  Yesterday FINDINGS: Endotracheal tube tip between the clavicular heads and carina. Feeding and orogastric tube at least reaches the stomach. Left IJ central line with tip at the SVC. Bilateral perihilar predominant lung opacity, airspace and interstitial. No effusion or pneumothorax noted. Normal heart size. IMPRESSION: 1. Stable positioning of tubes and central line. 2. Unchanged perihilar pneumonia with possible superimposed edema. Electronically Signed   By: Marnee Spring M.D.   On: 12/25/2016 07:10     STUDIES:  CXR 2/14 > Diffuse bilateral airspace infiltrates, ? Edema, PNA, or Hemorrhagic Consolidation   CT Head 2/14 > neg 2/15 echo>>>15-20 diffuse low EF Hepatitis panel >>    CULTURES: Blood 2/14 > Sputum 2/14 > normal flora Urine 2/14 > negative RVP 2/14 >POS flu B  ANTIBIOTICS: Vancomycin 2/14 >>>2/16 Rocephin 2/14 >>> 2/15 ceftaz 2/15 >>  Tamiflu 2/14 >>>   SIGNIFICANT EVENTS: 2/14 > Presents to ED after being found pulseless/apneic at home 2/15 prone 2/17 supine, normothermia ongoing, PEEP / FiO2 better  LINES/TUBES: ETT 2/14 >>  2/14 left IJ>>> 2 /14 a line rt rad>>>  DISCUSSION: 46 year old male with known ETOH and polysubstance abuse. Discharged from 8-10 days ago from Detox facility. For the last 3 days has had fever, chills, cough, and weakness, this morning girlfriend found him unresponsive and apneic. First Responders report 5-10 minutes of CPR before ROSC. ?Aspiration event vs CAP.  Flu positive  ASSESSMENT / PLAN:  PULMONARY A: Acute Respiratory Failure Flu pneumonitis ARDS Likely super infection bacterial PNA P:   Low Vt vent strategy Sedation as below, hopefull lighten as active cooling ends Follow CXR Decrease lasix 2/18 but continue, FACTT goals Continue steroids for flu pneumonitis, wean soon abx as below  CARDIOVASCULAR A:  Cardiac Arrest  Septic Shock, improved Septic cardiomyopathy vs etoh related P:  Decrease lasix on 2/18 Consider repeat TTE after acute illness, ? 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 P:    Continue ceftaz, adjust based on cx data Continue tamiflu at renal dose  ENDOCRINE A:   Hypoglycemia improved P:   Follow CBG on steroids and TF  NEUROLOGIC A:   S/p arrest, at risk anoxic injury Metabolic Encephalopathy   H/O ETOH and polysubstance abuse  >Ethyl level <5 P:  Attempt to wean propofol and fentanyl once active cooling phase completed - shivering has been issue.  Empiric lactulose Folate and thiamine given, ended 2/17   FAMILY  - Updates updated mom 2/15, she is making decisions. No family present 2/17  - Inter-disciplinary family meet or Palliative Care meeting due by: 2/21   Independent CC time 35 minutes  Levy Pupa, MD, PhD 12/25/2016, 8:35 AM [facility] [phone] or if no answer [phone]

## 2016-12-25 NOTE — Progress Notes (Signed)
30 cc of Versed wasted in sink witnessed by 2 RN's-Kazue Cerro & Ann Maki, RN. [place], Mitzi Hansen

## 2016-12-25 NOTE — Progress Notes (Signed)
E-Link MD notified of fingers on patient's right hand being dusky and cool.  Orders received to discontinue arterial line.  Notified e-Link MD of low water temperatures and patient's core temperature being 99.  Orders received to only give Motrin if temp > 100.5.  Will continue to monitor. [place], Mitzi Hansen

## 2016-12-25 NOTE — Procedures (Signed)
CVP set up

## 2016-12-26 ENCOUNTER — Inpatient Hospital Stay (HOSPITAL_COMMUNITY): Payer: Self-pay

## 2016-12-26 ENCOUNTER — Encounter (HOSPITAL_COMMUNITY): Payer: Self-pay

## 2016-12-26 DIAGNOSIS — L8990 Pressure ulcer of unspecified site, unspecified stage: Secondary | ICD-10-CM | POA: Insufficient documentation

## 2016-12-26 LAB — BASIC METABOLIC PANEL
Anion gap: 9 (ref 5–15)
BUN: 65 mg/dL — AB (ref 6–20)
CHLORIDE: 107 mmol/L (ref 101–111)
CO2: 36 mmol/L — AB (ref 22–32)
CREATININE: 1.36 mg/dL — AB (ref 0.61–1.24)
Calcium: 8 mg/dL — ABNORMAL LOW (ref 8.9–10.3)
GFR calc non Af Amer: >60 mL/min (ref >60)
Glucose, Bld: 126 mg/dL — ABNORMAL HIGH (ref 65–99)
Potassium: 3.7 mmol/L (ref 3.5–5.1)
Sodium: 152 mmol/L — ABNORMAL HIGH (ref 135–145)

## 2016-12-26 LAB — CULTURE, BLOOD (ROUTINE X 2)
Culture: NO GROWTH
Culture: NO GROWTH

## 2016-12-26 LAB — CBC
HCT: 39.2 % (ref 39.0–52.0)
Hemoglobin: 12.4 g/dL — ABNORMAL LOW (ref 13.0–17.0)
MCH: 27.7 pg (ref 26.0–34.0)
MCHC: 31.6 g/dL (ref 30.0–36.0)
MCV: 87.5 fL (ref 78.0–100.0)
Platelets: 43 10*3/uL — ABNORMAL LOW (ref 150–400)
RBC: 4.48 MIL/uL (ref 4.22–5.81)
RDW: 13.6 % (ref 11.5–15.5)
WBC: 24.8 10*3/uL — AB (ref 4.0–10.5)

## 2016-12-26 LAB — BLOOD GAS, ARTERIAL
ALLENS TEST (PASS/FAIL): 42624 — AB
Acid-Base Excess: 10.1 mmol/L — ABNORMAL HIGH (ref 0.0–2.0)
Bicarbonate: 34.3 mmol/L — ABNORMAL HIGH (ref 20.0–28.0)
DRAWN BY: 42624
FIO2: 50
MECHVT: 550 mL
O2 SAT: 94.2 %
PATIENT TEMPERATURE: 98.6
PEEP: 5 cmH2O
PO2 ART: 73 mmHg — AB (ref 83.0–108.0)
RATE: 30 resp/min
pCO2 arterial: 48.3 mmHg — ABNORMAL HIGH (ref 32.0–48.0)
pH, Arterial: 7.465 — ABNORMAL HIGH (ref 7.350–7.450)

## 2016-12-26 LAB — GLUCOSE, CAPILLARY
GLUCOSE-CAPILLARY: 112 mg/dL — AB (ref 65–99)
GLUCOSE-CAPILLARY: 115 mg/dL — AB (ref 65–99)
Glucose-Capillary: 123 mg/dL — ABNORMAL HIGH (ref 65–99)
Glucose-Capillary: 123 mg/dL — ABNORMAL HIGH (ref 65–99)
Glucose-Capillary: 183 mg/dL — ABNORMAL HIGH (ref 65–99)
Glucose-Capillary: 191 mg/dL — ABNORMAL HIGH (ref 65–99)

## 2016-12-26 LAB — MAGNESIUM: Magnesium: 2.6 mg/dL — ABNORMAL HIGH (ref 1.7–2.4)

## 2016-12-26 MED ORDER — DEXMEDETOMIDINE HCL IN NACL 200 MCG/50ML IV SOLN
0.0000 ug/kg/h | INTRAVENOUS | Status: DC
Start: 2016-12-26 — End: 2016-12-27
  Administered 2016-12-26: 1.2 ug/kg/h via INTRAVENOUS
  Administered 2016-12-26: 1 ug/kg/h via INTRAVENOUS
  Filled 2016-12-26 (×2): qty 50

## 2016-12-26 MED ORDER — PREDNISONE 5 MG/5ML PO SOLN
40.0000 mg | Freq: Every day | ORAL | Status: DC
  Administered 2016-12-26: 40 mg
  Filled 2016-12-26 (×2): qty 40

## 2016-12-26 MED ORDER — PROPOFOL 1000 MG/100ML IV EMUL
5.0000 ug/kg/min | INTRAVENOUS | Status: DC
  Administered 2016-12-26: 60 ug/kg/min via INTRAVENOUS
  Administered 2016-12-26: 50 ug/kg/min via INTRAVENOUS
  Administered 2016-12-26: 55 ug/kg/min via INTRAVENOUS
  Administered 2016-12-26: 50 ug/kg/min via INTRAVENOUS
  Administered 2016-12-27: 60 ug/kg/min via INTRAVENOUS
  Administered 2016-12-27: 65 ug/kg/min via INTRAVENOUS
  Administered 2016-12-27 (×2): 60 ug/kg/min via INTRAVENOUS
  Administered 2016-12-27: 70 ug/kg/min via INTRAVENOUS
  Administered 2016-12-27 – 2016-12-29 (×9): 60 ug/kg/min via INTRAVENOUS
  Administered 2016-12-29 (×2): 50 ug/kg/min via INTRAVENOUS
  Administered 2016-12-29 (×2): 60 ug/kg/min via INTRAVENOUS
  Administered 2016-12-29: 50 ug/kg/min via INTRAVENOUS
  Administered 2016-12-30: 60 ug/kg/min via INTRAVENOUS
  Administered 2016-12-30: 40 ug/kg/min via INTRAVENOUS
  Administered 2016-12-30: 60 ug/kg/min via INTRAVENOUS
  Filled 2016-12-26 (×6): qty 100
  Filled 2016-12-26: qty 200
  Filled 2016-12-26 (×21): qty 100

## 2016-12-26 MED ORDER — FREE WATER
200.0000 mL | Status: DC
  Administered 2016-12-26 – 2017-01-02 (×32): 200 mL

## 2016-12-26 NOTE — Progress Notes (Signed)
On assessment OG tube on LIS not in previous position, mostly out, unable to reposition so removed. Cortrak NG also out approx 15 cm from previous position, repositioned 10 cm and abd xray oerdered to verify placement.

## 2016-12-26 NOTE — Progress Notes (Signed)
PULMONARY / CRITICAL CARE MEDICINE   Name: Calvert Charland MRN: [id number] DOB: [birth year]    ADMISSION DATE:  12/21/2016 CONSULTATION DATE:  12/21/2016  REFERRING MD:  Dr. Blinda Leatherwood   CHIEF COMPLAINT: Cardiac Arrest  HISTORY OF PRESENT ILLNESS:  46 year old male with ETOH and polysubstance abuse, recently discharged from detox facility 8-10 days ago. Found in cardiac arrest, septic, normothermia, ards.  SUBJECTIVE/interval:  Hypothermia protocol completed.  Agitated and restless. No focal motor  VITAL SIGNS: BP (!) 145/95   Pulse 94   Temp (!) 100.9 F (38.3 C) (Core (Comment))   Resp (!) 35   Ht 6\' 1"  (1.854 m)   Wt 198 lb 6.6 oz (90 kg)   SpO2 96%   BMI 26.18 kg/m   HEMODYNAMICS:    VENTILATOR SETTINGS: Vent Mode: PRVC FiO2 (%):  [40 %-50 %] 50 % Set Rate:  [30 bmp] 30 bmp Vt Set:  [550 mL] 550 mL PEEP:  [5 cmH20-8 cmH20] 8 cmH20 Plateau Pressure:  [9 cmH20-23 cmH20] 21 cmH20  INTAKE / OUTPUT: I/O last 3 completed shifts: In: 4238.6 [I.V.:1313.6; Other:40; WU/JW:1191; IV Piggyback:200] Out: 5535 [Urine:5155; Emesis/NG output:180; Stool:200]  General appearance:  46 Year old  Male, restless,not interactive, RR in 30s, appears to be localizing Eyes: sclerae injected bilaterally, moist conjunctivae; PERRL Mouth:  membranes and no mucosal ulcerations; normal hard and soft palate, orally intubated  Neck: Trachea midline; neck supple, no JVD Lungs/chest: diffuse rhonchi w/ increased respiratory effort CV: tachy w/ RRR, no MRGs  Abdomen: Soft, non-tender; no masses or HSM Extremities/skin: trace bilateral peripheral edema right finger tips are discolored and purple. They are warm to palp, he has strong radial pulses bilaterally  Neuro/Psych: moves all extremities. No focal motor movement. Does not f/c   LABS:  BMET  Recent Labs Lab 12/24/16 0415 12/25/16 0404 12/26/16 0453  NA 141 144 152*  K 4.0 3.5 3.7  CL 102 104 107  CO2 28 32 36*  BUN 55* 58*  65*  CREATININE 1.68* 1.27* 1.36*  GLUCOSE 167* 159* 126*    Electrolytes  Recent Labs Lab 12/23/16 1830 12/24/16 0415 12/25/16 0404 12/26/16 0453  CALCIUM  --  7.6* 8.0* 8.0*  MG 1.8 2.0 2.5* 2.6*  PHOS 4.2 2.3* 2.0*  --     CBC  Recent Labs Lab 12/24/16 1807 12/25/16 0404 12/26/16 0453  WBC 13.0* 17.1* 24.8*  HGB 12.0* 12.7* 12.4*  HCT 35.8* 37.7* 39.2  PLT 29* 31* 43*    Coag's  Recent Labs Lab 12/21/16 0914 12/21/16 1848 12/23/16 1027 12/25/16 0404  APTT 38* 45* 41*  --   INR 1.60 1.53 1.52 1.14    Sepsis Markers  Recent Labs Lab 12/21/16 0540 12/21/16 0616 12/21/16 0914 12/22/16 0420 12/23/16 0415  LATICACIDVEN 9.62* 8.4* 3.8*  --   --   PROCALCITON  --   --  49.60 144.50 93.45    ABG  Recent Labs Lab 12/24/16 1642 12/24/16 2200 12/25/16 0330  PHART 7.492* 7.425 7.465*  PCO2ART 44.9 51.9* 48.3*  PO2ART 62.0* 75.5* 73.0*    Liver Enzymes  Recent Labs Lab 12/23/16 0415 12/24/16 0415 12/25/16 0404  AST 1,082* 468* 242*  ALT 1,199* 723* 528*  ALKPHOS 37* 48 59  BILITOT 0.9 0.9 0.8  ALBUMIN 2.2* 2.2* 2.2*    Cardiac Enzymes  Recent Labs Lab 12/21/16 1408 12/21/16 2030 12/22/16 0414  TROPONINI 0.21* 0.18* 0.18*    Glucose  Recent Labs Lab 12/25/16 1105 12/25/16 1650 12/25/16  2039 12/26/16 0058 12/26/16 0455 12/26/16 0755  GLUCAP 139* 122* 163* 123* 123* 112*    Imaging Dg Chest Port 1 View  Result Date: 12/26/2016 CLINICAL DATA:  46 year old male with respiratory distress syndrome. Subsequent encounter. EXAM: PORTABLE CHEST 1 VIEW COMPARISON:  12/25/2016 FINDINGS: Endotracheal tube tip 2.4 cm above the carina. Nasogastric tube and feeding tube are in place. Tips not imaged on current exam. Left central line has been retracted with the tip distal left brachiocephalic vein level. No pneumothorax. Asymmetric airspace disease has progressed since prior examination with progression most notable in the right lower  lobe. This may represent pulmonary edema with superimposed infectious infiltrate/ atelectasis right lower lobe. Heart size within normal limits. Calcified aorta. IMPRESSION: Retraction of left central line with the tip at the level of the left brachiocephalic vein. Progressive consolidation right lung base may represent atelectasis or infiltrate superimposed upon asymmetric pulmonary edema. Electronically Signed   By: Lacy Duverney M.D.   On: 12/26/2016 07:01     STUDIES:  CXR 2/14 > Diffuse bilateral airspace infiltrates, ? Edema, PNA, or Hemorrhagic Consolidation   CT Head 2/14 > neg 2/15 echo>>>15-20 diffuse low EF Hepatitis panel >>   CULTURES: Blood 2/14 > Sputum 2/14 > normal flora Urine 2/14 > negative RVP 2/14 >POS flu B  ANTIBIOTICS: Vancomycin 2/14 >>>2/16 Rocephin 2/14 >>> 2/15 ceftaz 2/15 >>  Tamiflu 2/14 >>>   SIGNIFICANT EVENTS: 2/14 > Presents to ED after being found pulseless/apneic at home 2/15 prone 2/17 supine, normothermia ongoing, PEEP / FiO2 better  LINES/TUBES: ETT 2/14 >>  2/14 left IJ>>> 2 /14 a line rt rad>>>out   Impression/plan Resolved problems Septic shock  Current problems Acute hypoxic respiratory failure in setting of influenza B pneumonitis c/b CAP vs aspiration PNA (NOS) and ARDS.  -remains supine. CXR still w/ R>L airspace disease. 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 ETOH vs sepsis vs post-cardiac arrest.  Plan Cont tele  Holding lasix Will hold off on asa given thrombocytopenia Will need repeat echo  Acute Encephalopathy s/p cardiac arrest. ? Hypoxic encephalopathy vs to what degree metabolic vs w/d (h/o ETOH) ->completed hypothermia protocol   Plan Cont lactulose  Change to precedex Try to get fent to PRN  Dry gangrenous changes to fingers of right hand.  Warm to palp. Good pulses.  Plan Observe   AKI d/t ATN Plan  Trend chemistry   Hypernatremia  Plan Add free water replacement  Hold lasix Repeat chem in am   Thrombocytopenia (suspect septic coagulopathy) c/b underlying liver disease. plts improved.  Plan Trend cbc  PAS Consider [place] heparin when plts > 100  Shock liver -->improved Plan Repeat LFTs PRN  Leukocytosis Plan Trend cbc Tapering steroids  Repeat sputum    Discussion Clinically a little better overall when looking at oxygen and ventilatory needs. His encephalopathy is his biggest barrier at this point.  For today plan: Wean O2/peep Holding lasix Adding free water.  Complete tamiflu today  Repeat sputum culture and cont ceftaz Try precedex I think he may need trach   Family updated at length  My critical care time 46 minutes  Simonne Martinet ACNP-BC [employer]  Pulmonary/Critical Care Pager # [phone] OR # [phone] if no answer

## 2016-12-26 NOTE — Progress Notes (Signed)
PULMONARY / CRITICAL CARE MEDICINE   Name: Troy Brennan MRN: [id number] DOB: [birth year]    ADMISSION DATE:  12/21/2016 CONSULTATION DATE:  12/21/2016  REFERRING MD:  Dr. Blinda Leatherwood   CHIEF COMPLAINT: Cardiac Arrest  HISTORY OF PRESENT ILLNESS:  46 year old male with ETOH and polysubstance abuse, recently discharged from detox facility 8-10 days ago. Found in cardiac arrest, septic, normothermia, ards.  SUBJECTIVE/interval:  Still agitated at times   VITAL SIGNS: BP (!) 155/83   Pulse 98   Temp (!) 100.4 F (38 C)   Resp (!) 40   Ht 6\' 1"  (1.854 m)   Wt 198 lb 6.6 oz (90 kg)   SpO2 93%   BMI 26.18 kg/m   HEMODYNAMICS:    VENTILATOR SETTINGS: Vent Mode: PRVC FiO2 (%):  [40 %-50 %] 40 % Set Rate:  [30 bmp] 30 bmp Vt Set:  [550 mL] 550 mL PEEP:  [8 cmH20] 8 cmH20 Plateau Pressure:  [22 cmH20] 22 cmH20  INTAKE / OUTPUT: I/O last 3 completed shifts: In: 5874.5 [I.V.:2254.5; Other:40; NG/GT:3330; IV Piggyback:250] Out: 4345 [Urine:3790; Emesis/NG output:380; Stool:175]  General appearance:  46 Year old  Male well nourished still not waking up.  Eyes: injected sclerae w/ hemorrhage on right. Left is cloudy appearing  , moist conjunctivae; PERRL Mouth:  membranes and no mucosal ulcerations; normal hard and soft palate Neck: Trachea midline; neck supple, no JVD Lungs/chest: scattered rhonchi, with normal respiratory effort and no intercostal retractions CV: RRR, no MRGs  Abdomen: Soft, non-tender; no masses or HSM Extremities: + anasarca/peripheral edema or extremity lymphadenopathy Skin: Normal temperature, turgor and texture; diffuse petechial  Rash that covers his arms, penis and hands. His right hand has evidence of evolving dry gangrene this is marked and has not spread much c/w 2/19 Neuro/Psych: non-focal. Does not f/c. Has upper extremity movements that appear somewhere in between localizing and posturing.    LABS:  BMET  Recent Labs Lab 12/25/16 0404  12/26/16 0453 12/27/16 0500  NA 144 152* 152*  K 3.5 3.7 3.2*  CL 104 107 109  CO2 32 36* 34*  BUN 58* 65* 60*  CREATININE 1.27* 1.36* 1.23  GLUCOSE 159* 126* 127*    Electrolytes  Recent Labs Lab 12/23/16 1830 12/24/16 0415 12/25/16 0404 12/26/16 0453 12/27/16 0500  CALCIUM  --  7.6* 8.0* 8.0* 8.0*  MG 1.8 2.0 2.5* 2.6*  --   PHOS 4.2 2.3* 2.0*  --   --     CBC  Recent Labs Lab 12/25/16 0404 12/26/16 0453 12/27/16 0500  WBC 17.1* 24.8* 21.8*  HGB 12.7* 12.4* 11.9*  HCT 37.7* 39.2 38.2*  PLT 31* 43* 53*    Coag's  Recent Labs Lab 12/21/16 0914 12/21/16 1848 12/23/16 1027 12/25/16 0404  APTT 38* 45* 41*  --   INR 1.60 1.53 1.52 1.14    Sepsis Markers  Recent Labs Lab 12/21/16 0540 12/21/16 0616 12/21/16 0914 12/22/16 0420 12/23/16 0415  LATICACIDVEN 9.62* 8.4* 3.8*  --   --   PROCALCITON  --   --  49.60 144.50 93.45    ABG  Recent Labs Lab 12/24/16 1642 12/24/16 2200 12/25/16 0330  PHART 7.492* 7.425 7.465*  PCO2ART 44.9 51.9* 48.3*  PO2ART 62.0* 75.5* 73.0*    Liver Enzymes  Recent Labs Lab 12/23/16 0415 12/24/16 0415 12/25/16 0404  AST 1,082* 468* 242*  ALT 1,199* 723* 528*  ALKPHOS 37* 48 59  BILITOT 0.9 0.9 0.8  ALBUMIN 2.2* 2.2* 2.2*  Cardiac Enzymes  Recent Labs Lab 12/21/16 1408 12/21/16 2030 12/22/16 0414  TROPONINI 0.21* 0.18* 0.18*    Glucose  Recent Labs Lab 12/26/16 1126 12/26/16 1510 12/26/16 1938 12/27/16 0008 12/27/16 0504 12/27/16 0819  GLUCAP 115* 183* 191* 178* 123* 98    Imaging Dg Chest Port 1 View  Result Date: 12/27/2016 CLINICAL DATA:  46 year old male with shortness of breath and pneumonia. EXAM: PORTABLE CHEST 1 VIEW COMPARISON:  Chest radiograph dated 12/26/2016 FINDINGS: Endotracheal tube approximately 3 cm above the carina in stable positioning. Left IJ central line appears stable with tip likely at the junction of the SVC and left innominate vein. An enteric tube is  partially visualized. Diffuse bilateral interstitial and perihilar airspace disease. There has been interval improvement of the airspace infiltrate involving the right lung compared to the prior radiograph. There is no pleural effusion or pneumothorax. Stable cardiac silhouette. No acute osseous pathology. IMPRESSION: Bilateral interstitial prominence and airspace haziness with overall slight interval improvement of the airspace disease in the right mid to lower lung field compared to prior radiograph. Clinical correlation and follow-up recommended. Support device in stable positioning. Electronically Signed   By: Elgie Collard M.D.   On: 12/27/2016 06:43   Dg Abd Portable 1v  Result Date: 12/26/2016 CLINICAL DATA:  NG tube placement EXAM: PORTABLE ABDOMEN - 1 VIEW COMPARISON:  12/22/2016 FINDINGS: Lung bases grossly clear. Upper bowel-gas pattern within normal limits. Esophageal tube tip overlies the duodenum jejunal junction. IMPRESSION: Esophageal tube tip overlies the ligament of Treitz. Electronically Signed   By: Jasmine Pang M.D.   On: 12/26/2016 22:45  PCXR: aeration a little improved.   STUDIES:  CXR 2/14 > Diffuse bilateral airspace infiltrates, ? Edema, PNA, or Hemorrhagic Consolidation   CT Head 2/14 > neg 2/15 echo>>>15-20 diffuse low EF Hepatitis panel >>  CT head 2/20>>> EEG 2/20>>>  CULTURES: Blood 2/14 > Sputum 2/14 > normal flora Urine 2/14 > negative RVP 2/14 >POS flu B Sputum 2/20>>>  ANTIBIOTICS: Vancomycin 2/14 >>>2/16 Rocephin 2/14 >>> 2/15 ceftaz 2/15 >>  Tamiflu 2/14 >>> 2/19  SIGNIFICANT EVENTS: 2/14 > Presents to ED after being found pulseless/apneic at home 2/15 prone 2/17 supine, normothermia ongoing, PEEP / FiO2 better 2/19 . Normoothermia protocol completed. attempted to transition to precedex. Did not tolerated d/t increased RR and tachycardia 2/20 still not following commands. Get MRI brain, getting EEG. Starting seroquel I low dose clonazepam.    LINES/TUBES: ETT 2/14 >>  2/14 left IJ>>> 2 /14 a line rt rad>>>out   Impression/plan Resolved problems Septic shock  Current problems PULMONARY Acute hypoxic respiratory failure in the setting of influenza B pneumonitis c/b CAP vs aspiration PNA (NOS) and ARDS Mucous plugging  -CXR improved.  -RT still getting intermittent mucous plugging.  -MS is major barrier to extubation  Plan Cont current vent support Working on sedation regimen to facilitate weaning (see neuro) Cont abx as mentioned below Add hypertonic NS neb  CARDIOVASCULAR S/p cardiac arrest  Systolic CM w/ EF 15-20% ? ETOH vs sepsis vs post-cardiac arrest HTN Plan Cont tele Add BB Can consider adding ASA if PLTs cont to improve.   NEURO Acute encephalopathy s/p cardiac arrest; ? Hypoxia related vs withdrawal vs some degree of metabolic  -did not respond to precedex Plan Cont lactulose  Add Seroquel and clonazepam  Goal is to wean diprivan off today (2/20) Will get MRI brain and get EEG to assess for any structural injury or electrical malfunction that explains his persistent  encephalopathic state.   DERM Dry gangrenous changes of the right hand Diffuse petechial rash  Plan Observe  RENAL AKI d/t ATN-->this improved w/ holding diuretics  Hypernatremia -->still not improved  Hypokalemia  Plan Replace free water (add D5W infusion @ 75) Replace KCL/ recheck in am  F/u am chemistry   HEMATOLOGY  Thrombocytopenia (suspect septic coagulopathy) c/b underlying liver disease  PLTs improving slowly  Plan  Cont PAS and holding heparin at this point F/u CBC Consider [place] heparin when PLTs > 100  GI Shock liver  Protein calorie malnutrition in sepsis  Plan Continue tube feeds Cont PPI F/u am LFTs  INFECTIOUS DISEASE Influenza B PNA CAP Vs aspiration (NOS) Leukocytosis  -Completed tamiflu -still having low grade fever & leukocytosis not improved much however CXR has improved some.  Plan F/u  repeat sputum  Wean pred down over next 5-7d (may be contributing to leukocytosis)  Cont Elita Quick  Discussion 46 year old male s/p cardiac arrest in setting of influenza B and aspiration vs CAP. He completed normothermia protocol. He has completed tamiflu. Was prone, now supine and vent mechanics and CXR have improved. His major barrier at this point is his neurological status. Will go ahead and repeat neuro-diagnostics to r/o subclinical seizure or evidence of structural neurological insult from anoxia. If there are negative I think early trach makes sense. Starting seroquel and clonazepam today as did not respond to precedex. Other issues of concern is the dry gangrenous changes of his right hand but can't do anything about this currently w/ his thrombocytopenia and treatment is observation anyhow.   My critical care time 53 minutes.   Simonne Martinet ACNP-BC [facility] Pulmonary/Critical Care Pager # [phone] OR # [phone] if no answer  STAFF NOTE: I, Rory Percy, MD FACP have personally reviewed patient's available data, including medical history, events of note, physical examination and test results as part of my evaluation. I have discussed with resident/NP and other care providers such as pharmacist, RN and RRT. In addition, I personally evaluated patient and elicited key findings of:  Not awake, not following commands, moves al ext, thrashes with agitation, coughing and gag wnl, improved lung sounds to coarse, on min support, no new abg, is neuro recovering to some extent, exactly where he will end up in fucntion is not clear, would move toward trach so we can reduceu sedation further, consider MRI brain to r/o severe anoxia that may alter our prognostication / plan, K supp, assess MRI prior to any sig correction NA with risk brain edema, follow wbc further, repeat aag, if pos seziure, would dc any antipsychotics, pcxr with intermittent atx, peep 8, dc prone bed, suciton, nebs added, reduce  steroids The patient is critically ill with multiple organ systems failure and requires high complexity decision making for assessment and support, frequent evaluation and titration of therapies, application of advanced monitoring technologies and extensive interpretation of multiple databases.   Critical Care Time devoted to patient care services described in this note is 40 Minutes. This time reflects time of care of this signee: Rory Percy, MD FACP. This critical care time does not reflect procedure time, or teaching time or supervisory time of PA/NP/Med student/Med Resident etc but could involve care discussion time. Rest per NP/medical resident whose note is outlined above and that I agree with   Mcarthur Rossetti. Tyson Alias, MD, FACP Pgr: [phone] [place] [facility] 12/27/2016 10:49 AM

## 2016-12-27 ENCOUNTER — Inpatient Hospital Stay (HOSPITAL_COMMUNITY): Payer: Self-pay

## 2016-12-27 LAB — BASIC METABOLIC PANEL
ANION GAP: 9 (ref 5–15)
BUN: 60 mg/dL — ABNORMAL HIGH (ref 6–20)
CHLORIDE: 109 mmol/L (ref 101–111)
CO2: 34 mmol/L — AB (ref 22–32)
Calcium: 8 mg/dL — ABNORMAL LOW (ref 8.9–10.3)
Creatinine, Ser: 1.23 mg/dL (ref 0.61–1.24)
GFR calc non Af Amer: >60 mL/min (ref >60)
GLUCOSE: 127 mg/dL — AB (ref 65–99)
Potassium: 3.2 mmol/L — ABNORMAL LOW (ref 3.5–5.1)
Sodium: 152 mmol/L — ABNORMAL HIGH (ref 135–145)

## 2016-12-27 LAB — GLUCOSE, CAPILLARY
GLUCOSE-CAPILLARY: 98 mg/dL (ref 65–99)
GLUCOSE-CAPILLARY: 138 mg/dL — AB (ref 65–99)
Glucose-Capillary: 123 mg/dL — ABNORMAL HIGH (ref 65–99)
Glucose-Capillary: 156 mg/dL — ABNORMAL HIGH (ref 65–99)
Glucose-Capillary: 178 mg/dL — ABNORMAL HIGH (ref 65–99)
Glucose-Capillary: 185 mg/dL — ABNORMAL HIGH (ref 65–99)

## 2016-12-27 LAB — CBC
HCT: 38.2 % — ABNORMAL LOW (ref 39.0–52.0)
HEMOGLOBIN: 11.9 g/dL — AB (ref 13.0–17.0)
MCH: 27.5 pg (ref 26.0–34.0)
MCHC: 31.2 g/dL (ref 30.0–36.0)
MCV: 88.4 fL (ref 78.0–100.0)
Platelets: 53 10*3/uL — ABNORMAL LOW (ref 150–400)
RBC: 4.32 MIL/uL (ref 4.22–5.81)
RDW: 13.8 % (ref 11.5–15.5)
WBC: 21.8 10*3/uL — ABNORMAL HIGH (ref 4.0–10.5)

## 2016-12-27 LAB — ABO/RH: ABO/RH(D): O NEG

## 2016-12-27 LAB — TRIGLYCERIDES: Triglycerides: 369 mg/dL — ABNORMAL HIGH (ref <150)

## 2016-12-27 MED ORDER — POTASSIUM CHLORIDE 20 MEQ/15ML (10%) PO SOLN
40.0000 meq | Freq: Two times a day (BID) | ORAL | Status: AC
  Administered 2016-12-27 – 2016-12-28 (×2): 40 meq
  Filled 2016-12-27 (×2): qty 30

## 2016-12-27 MED ORDER — VECURONIUM BOLUS VIA INFUSION
10.0000 mg | Freq: Once | INTRAVENOUS | Status: DC
  Filled 2016-12-27: qty 10

## 2016-12-27 MED ORDER — SODIUM CHLORIDE 3 % IN NEBU
4.0000 mL | INHALATION_SOLUTION | Freq: Every day | RESPIRATORY_TRACT | Status: AC
  Administered 2016-12-27 – 2016-12-29 (×3): 4 mL via RESPIRATORY_TRACT
  Filled 2016-12-27 (×3): qty 4

## 2016-12-27 MED ORDER — VECURONIUM BROMIDE 10 MG IV SOLR
10.0000 mg | Freq: Once | INTRAVENOUS | Status: DC
  Filled 2016-12-27: qty 10

## 2016-12-27 MED ORDER — CLONAZEPAM 1 MG PO TABS: 1.0000 mg | ORAL_TABLET | Freq: Two times a day (BID) | ORAL | Status: DC

## 2016-12-27 MED ORDER — FENTANYL CITRATE (PF) 100 MCG/2ML IJ SOLN
200.0000 ug | Freq: Once | INTRAMUSCULAR | Status: DC
  Filled 2016-12-27: qty 4

## 2016-12-27 MED ORDER — MIDAZOLAM HCL 2 MG/2ML IJ SOLN
4.0000 mg | Freq: Once | INTRAMUSCULAR | Status: AC
  Administered 2016-12-29: 4 mg via INTRAVENOUS
  Filled 2016-12-27: qty 4

## 2016-12-27 MED ORDER — MIDAZOLAM HCL 2 MG/2ML IJ SOLN
4.0000 mg | Freq: Once | INTRAMUSCULAR | Status: AC
  Administered 2016-12-27: 4 mg via INTRAVENOUS
  Filled 2016-12-27 (×2): qty 4

## 2016-12-27 MED ORDER — VECURONIUM BROMIDE 10 MG IV SOLR
10.0000 mg | Freq: Once | INTRAVENOUS | Status: AC
  Administered 2016-12-27: 10 mg via INTRAVENOUS
  Filled 2016-12-27: qty 10

## 2016-12-27 MED ORDER — DEXTROSE 5 % IV SOLN
INTRAVENOUS | Status: DC
  Administered 2016-12-27 – 2017-01-04 (×4): via INTRAVENOUS

## 2016-12-27 MED ORDER — SODIUM CHLORIDE 0.9 % IV SOLN: Freq: Once | INTRAVENOUS | Status: DC

## 2016-12-27 MED ORDER — GADOBENATE DIMEGLUMINE 529 MG/ML IV SOLN
20.0000 mL | Freq: Once | INTRAVENOUS | Status: AC | PRN
  Administered 2016-12-27: 18 mL via INTRAVENOUS

## 2016-12-27 MED ORDER — QUETIAPINE FUMARATE 100 MG PO TABS
100.0000 mg | ORAL_TABLET | Freq: Two times a day (BID) | ORAL | Status: DC
  Administered 2016-12-27 – 2016-12-28 (×2): 100 mg
  Filled 2016-12-27 (×2): qty 1

## 2016-12-27 MED ORDER — ETOMIDATE 2 MG/ML IV SOLN
40.0000 mg | Freq: Once | INTRAVENOUS | Status: AC
  Administered 2016-12-27: 20 mg via INTRAVENOUS
  Filled 2016-12-27: qty 20

## 2016-12-27 MED ORDER — PREDNISONE 20 MG PO TABS
20.0000 mg | ORAL_TABLET | Freq: Every day | ORAL | Status: DC
  Administered 2016-12-28: 20 mg
  Filled 2016-12-27: qty 1

## 2016-12-27 MED ORDER — CLONAZEPAM 0.1 MG/ML ORAL SUSPENSION: 1.0000 mg | Freq: Two times a day (BID) | ORAL | Status: DC

## 2016-12-27 MED ORDER — CLONAZEPAM 1 MG PO TABS
1.0000 mg | ORAL_TABLET | Freq: Two times a day (BID) | ORAL | Status: DC
Start: 2016-12-27 — End: 2016-12-28
  Administered 2016-12-27 – 2016-12-28 (×2): 1 mg
  Filled 2016-12-27 (×2): qty 1

## 2016-12-27 MED ORDER — METOPROLOL TARTRATE 25 MG/10 ML ORAL SUSPENSION
50.0000 mg | Freq: Two times a day (BID) | ORAL | Status: DC
  Administered 2016-12-27 – 2017-01-06 (×17): 50 mg
  Filled 2016-12-27 (×19): qty 20

## 2016-12-27 MED ORDER — ETOMIDATE 2 MG/ML IV SOLN: 40.0000 mg | Freq: Once | INTRAVENOUS | Status: DC

## 2016-12-27 MED ORDER — PREDNISONE 20 MG PO TABS
40.0000 mg | ORAL_TABLET | Freq: Every day | ORAL | Status: DC
Start: 2016-12-27 — End: 2016-12-27
  Administered 2016-12-27: 40 mg
  Filled 2016-12-27: qty 2

## 2016-12-27 NOTE — Progress Notes (Signed)
RT note: Sputum sample obtained and sent down to main lab without complications.   

## 2016-12-27 NOTE — Progress Notes (Signed)
EEG Completed; Results Pending  

## 2016-12-27 NOTE — Progress Notes (Signed)
Patient ventilator alarm noted to continuously ringing Regulation Pressure Limit.  Patient was biting down on ETT.  Placed bite block in patient and lavaged a moderate amount of thick mucus plugs out of patient.  Peak pressure on vent currently reading 33.  Will continue to monitor.

## 2016-12-27 NOTE — Progress Notes (Signed)
eLink Physician-Brief Progress Note Patient Name: Troy Brennan DOB: 1971/10/20 MRN: [id number]   Date of Service  12/27/2016  HPI/Events of Note  K 3.2  eICU Interventions  Repleted through feeding tube     Intervention Category Minor Interventions: Electrolytes abnormality - evaluation and management  [name] 12/27/2016, 6:50 AM

## 2016-12-27 NOTE — Procedures (Signed)
ELECTROENCEPHALOGRAM REPORT  Date of Study: 12/27/2016  Patient's Name: Troy Brennan MRN: [id number] Date of Birth: October 05, 1971  Referring Provider: Dr. Rory Percy  Clinical History: This is a 46 year old man s/p cardiac arrest.  Medications: propofol (DIPRIVAN) 1000 MG/100ML infusion  fentaNYL (SUBLIMAZE) 2,500 mcg in sodium chloride 0.9 % 250 mL (10 mcg/mL) infusion  cefTAZidime (FORTAZ) 1 g in dextrose 5 % 50 mL IVPB  clonazePAM (KLONOPIN) tablet 1 mg  free water 200 mL  ibuprofen (ADVIL,MOTRIN) 100 MG/5ML suspension 400 mg  labetalol (NORMODYNE,TRANDATE) injection 10 mg  lactulose (CHRONULAC) 10 GM/15ML solution 30 g  metoprolol tartrate (LOPRESSOR) 25 mg/10 mL oral suspension 50 mg  pantoprazole (PROTONIX) injection 40 mg  potassium chloride 20 MEQ/15ML (10%) solution 40 mEq  predniSONE (DELTASONE) tablet 20 mg  QUEtiapine (SEROQUEL) tablet 100 mg  vecuronium (NORCURON) injection 10 mg   Technical Summary: A multichannel digital EEG recording measured by the international 10-20 system with electrodes applied with paste and impedances below 5000 ohms performed as portable with EKG monitoring in an intubated and sedated patient.  Hyperventilation and photic stimulation were not performed.  The digital EEG was referentially recorded, reformatted, and digitally filtered in a variety of bipolar and referential montages for optimal display.   Description: The patient is intubated and sedated on Propofol and Fentanyl during the recording.  There is no clear posterior dominant rhythm. The background is continuous with a mixture of beta activity, theta and delta slowing, at times with poorly formed vertex waves seen. Hyperventilation and photic stimulation were not performed. There was no focal slowing, no epileptiform discharges or electrographic seizures seen.    EKG lead was unremarkable.  Impression: This sedated EEG is abnormal due to diffuse slowing of the  background.  Clinical Correlation of the above findings indicates diffuse cerebral dysfunction that is non-specific in etiology and can be seen with hypoxic/ischemic injury, toxic/metabolic encephalopathies, or medication effect from Propofol/Fentanyl. There were no electrographic seizures in this study. Clinical correlation is advised.   Patrcia Dolly, M.D.

## 2016-12-28 ENCOUNTER — Inpatient Hospital Stay (HOSPITAL_COMMUNITY): Payer: Self-pay

## 2016-12-28 DIAGNOSIS — G931 Anoxic brain damage, not elsewhere classified: Secondary | ICD-10-CM

## 2016-12-28 LAB — BLOOD GAS, ARTERIAL
ACID-BASE EXCESS: 5.2 mmol/L — AB (ref 0.0–2.0)
Bicarbonate: 29.4 mmol/L — ABNORMAL HIGH (ref 20.0–28.0)
DRAWN BY: 441371
FIO2: 40
LHR: 24 {breaths}/min
MECHVT: 550 mL
O2 SAT: 96 %
PEEP/CPAP: 5 cmH2O
PH ART: 7.403 (ref 7.350–7.450)
PO2 ART: 97.6 mmHg (ref 83.0–108.0)
Patient temperature: 101.8
pCO2 arterial: 49.3 mmHg — ABNORMAL HIGH (ref 32.0–48.0)

## 2016-12-28 LAB — COMPREHENSIVE METABOLIC PANEL
ALBUMIN: 2.1 g/dL — AB (ref 3.5–5.0)
ALK PHOS: 56 U/L (ref 38–126)
ALT: 161 U/L — ABNORMAL HIGH (ref 17–63)
ANION GAP: 8 (ref 5–15)
AST: 55 U/L — ABNORMAL HIGH (ref 15–41)
BUN: 39 mg/dL — ABNORMAL HIGH (ref 6–20)
CHLORIDE: 110 mmol/L (ref 101–111)
CO2: 32 mmol/L (ref 22–32)
Calcium: 7.9 mg/dL — ABNORMAL LOW (ref 8.9–10.3)
Creatinine, Ser: 1.03 mg/dL (ref 0.61–1.24)
GFR calc Af Amer: >60 mL/min (ref >60)
GFR calc non Af Amer: >60 mL/min (ref >60)
GLUCOSE: 91 mg/dL (ref 65–99)
POTASSIUM: 3.8 mmol/L (ref 3.5–5.1)
Sodium: 150 mmol/L — ABNORMAL HIGH (ref 135–145)
Total Bilirubin: 0.4 mg/dL (ref 0.3–1.2)
Total Protein: 5.6 g/dL — ABNORMAL LOW (ref 6.5–8.1)

## 2016-12-28 LAB — CBC
HEMATOCRIT: 38.3 % — AB (ref 39.0–52.0)
HEMOGLOBIN: 11.9 g/dL — AB (ref 13.0–17.0)
MCH: 27.9 pg (ref 26.0–34.0)
MCHC: 31.1 g/dL (ref 30.0–36.0)
MCV: 89.7 fL (ref 78.0–100.0)
Platelets: 66 10*3/uL — ABNORMAL LOW (ref 150–400)
RBC: 4.27 MIL/uL (ref 4.22–5.81)
RDW: 14.2 % (ref 11.5–15.5)
WBC: 18.9 10*3/uL — ABNORMAL HIGH (ref 4.0–10.5)

## 2016-12-28 LAB — PREPARE PLATELET PHERESIS
Blood Product Expiration Date: 201802212237
UNIT TYPE AND RH: 6200

## 2016-12-28 LAB — GLUCOSE, CAPILLARY
Glucose-Capillary: 111 mg/dL — ABNORMAL HIGH (ref 65–99)
Glucose-Capillary: 131 mg/dL — ABNORMAL HIGH (ref 65–99)
Glucose-Capillary: 144 mg/dL — ABNORMAL HIGH (ref 65–99)
Glucose-Capillary: 72 mg/dL (ref 65–99)
Glucose-Capillary: 86 mg/dL (ref 65–99)
Glucose-Capillary: 88 mg/dL (ref 65–99)

## 2016-12-28 MED ORDER — VANCOMYCIN HCL IN DEXTROSE 750-5 MG/150ML-% IV SOLN
750.0000 mg | Freq: Three times a day (TID) | INTRAVENOUS | Status: AC
  Administered 2016-12-28 – 2017-01-03 (×19): 750 mg via INTRAVENOUS
  Filled 2016-12-28 (×22): qty 150

## 2016-12-28 MED ORDER — CLONAZEPAM 1 MG PO TABS
2.0000 mg | ORAL_TABLET | Freq: Two times a day (BID) | ORAL | Status: DC
  Administered 2016-12-28 – 2017-01-02 (×9): 2 mg
  Filled 2016-12-28 (×9): qty 2

## 2016-12-28 MED ORDER — QUETIAPINE FUMARATE 100 MG PO TABS
100.0000 mg | ORAL_TABLET | Freq: Three times a day (TID) | ORAL | Status: DC
  Administered 2016-12-28 – 2016-12-30 (×7): 100 mg
  Filled 2016-12-28 (×7): qty 1

## 2016-12-28 MED ORDER — DEXTROSE 5 % IV SOLN
INTRAVENOUS | Status: DC
  Administered 2016-12-29 (×3): via INTRAVENOUS

## 2016-12-28 MED ORDER — VITAL AF 1.2 CAL PO LIQD
1000.0000 mL | ORAL | Status: DC
  Administered 2016-12-28 – 2016-12-31 (×3): 1000 mL
  Filled 2016-12-28 (×2): qty 1000

## 2016-12-28 MED ORDER — VANCOMYCIN HCL 10 G IV SOLR
1500.0000 mg | Freq: Once | INTRAVENOUS | Status: AC
  Administered 2016-12-28: 1500 mg via INTRAVENOUS
  Filled 2016-12-28: qty 1500

## 2016-12-28 MED ORDER — PRO-STAT SUGAR FREE PO LIQD
60.0000 mL | Freq: Three times a day (TID) | ORAL | Status: DC
  Administered 2016-12-28 – 2017-01-02 (×13): 60 mL
  Filled 2016-12-28 (×13): qty 60

## 2016-12-28 NOTE — Progress Notes (Signed)
Family Meeting Note  Advance Directive:no  Today a meeting took place with the mother, uncle and sister-inlaw. .  Patient is unable to participate due KA:[id number] capacity Comatose   The following clinical team members were present during this meeting:MD, APN and RN  The following were discussed:Patient's diagnosis: , Patient's progosis: Unable to determine and Goals for treatment: DNR  Additional follow-up to be provided: family awaiting neurology in-put. He is now DNR w/ no escalation. They understand risk that this is a devastating neurological injury and there is a chance that even w/ time and best effort he may never improve past persistent vegetative state.   Time spent during discussion:45 minutes  Shelby Mattocks, NP Simonne Martinet ACNP-BC [facility] Pager # [phone] OR # [phone] if no answer
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
## 2016-12-28 NOTE — Progress Notes (Signed)
Attempted to wean pt on CPAP 5 PS 14, failed.  Tried PCV to see if patient would be more comfortable, but they did not tolerate well. Pt placed back on PRVC and NP requested more sedation.

## 2016-12-28 NOTE — Progress Notes (Signed)
Spoke w/ family Will go ahead w/ trach tomorrow.   Simonne Martinet ACNP-BC [facility] Pulmonary/Critical Care Pager # [phone] OR # [phone] if no answer

## 2016-12-28 NOTE — Progress Notes (Signed)
ANTIBIOTIC CONSULT NOTE - INITIAL  Pharmacy Consult for vancomycin  Indication: pneumonia  No Known Allergies  Patient Measurements: Height: 6\' 1"  (185.4 cm) Weight: 155 lb 6.8 oz (70.5 kg) IBW/kg (Calculated) : 79.9   Vital Signs: Temp: 102.4 F (39.1 C) (02/21 0900) Temp Source: Core (Comment) (02/21 0900) BP: 101/47 (02/21 1039) Pulse Rate: 87 (02/21 1039) Intake/Output from previous day: 02/20 0701 - 02/21 0700 In: 4875.7 [I.V.:2852.3; NG/GT:1873.3; IV Piggyback:150] Out: 2325 [Urine:2125; Stool:200] Intake/Output from this shift: Total I/O In: 347.4 [I.V.:147.4; NG/GT:200] Out: 350 [Urine:350]  Labs:  Recent Labs  12/26/16 0453 12/27/16 0500 12/28/16 0345  WBC 24.8* 21.8* 18.9*  HGB 12.4* 11.9* 11.9*  PLT 43* 53* 66*  CREATININE 1.36* 1.23 1.03   Estimated Creatinine Clearance: 90.3 mL/min (by C-G formula based on SCr of 1.03 mg/dL). No results for input(s): VANCOTROUGH, VANCOPEAK, VANCORANDOM, GENTTROUGH, GENTPEAK, GENTRANDOM, TOBRATROUGH, TOBRAPEAK, TOBRARND, AMIKACINPEAK, AMIKACINTROU, AMIKACIN in the last 72 hours.   Microbiology: Recent Results (from the past 720 hour(s))  Culture, blood (Routine X 2) w Reflex to ID Panel     Status: None   Collection Time: 12/21/16  6:10 AM  Result Value Ref Range Status   Specimen Description BLOOD LEFT WRIST  Final   Special Requests BOTTLES DRAWN AEROBIC ONLY  5CC  Final   Culture NO GROWTH 5 DAYS  Final   Report Status 12/26/2016 FINAL  Final  Culture, blood (Routine X 2) w Reflex to ID Panel     Status: None   Collection Time: 12/21/16  6:18 AM  Result Value Ref Range Status   Specimen Description BLOOD LEFT HAND  Final   Special Requests IN PEDIATRIC BOTTLE  3CC  Final   Culture NO GROWTH 5 DAYS  Final   Report Status 12/26/2016 FINAL  Final  Urine culture     Status: None   Collection Time: 12/21/16  6:46 AM  Result Value Ref Range Status   Specimen Description URINE, CATHETERIZED  Final   Special  Requests NONE  Final   Culture NO GROWTH  Final   Report Status 12/22/2016 FINAL  Final  Culture, respiratory (NON-Expectorated)     Status: None   Collection Time: 12/21/16 10:12 AM  Result Value Ref Range Status   Specimen Description TRACHEAL ASPIRATE  Final   Special Requests NONE  Final   Gram Stain   Final    FEW WBC PRESENT, PREDOMINANTLY PMN ABUNDANT GRAM POSITIVE COCCI ABUNDANT GRAM NEGATIVE RODS FEW YEAST    Culture Consistent with normal respiratory flora.  Final   Report Status 12/23/2016 FINAL  Final  MRSA PCR Screening     Status: None   Collection Time: 12/21/16 10:14 AM  Result Value Ref Range Status   MRSA by PCR NEGATIVE NEGATIVE Final    Comment:        The GeneXpert MRSA Assay (FDA approved for NASAL specimens only), is one component of a comprehensive MRSA colonization surveillance program. It is not intended to diagnose MRSA infection nor to guide or monitor treatment for MRSA infections.   Respiratory Panel by PCR     Status: Abnormal   Collection Time: 12/21/16 11:35 AM  Result Value Ref Range Status   Adenovirus NOT DETECTED NOT DETECTED Final   Coronavirus 229E NOT DETECTED NOT DETECTED Final   Coronavirus HKU1 NOT DETECTED NOT DETECTED Final   Coronavirus NL63 NOT DETECTED NOT DETECTED Final   Coronavirus OC43 NOT DETECTED NOT DETECTED Final   Metapneumovirus NOT DETECTED NOT  DETECTED Final   Rhinovirus / Enterovirus NOT DETECTED NOT DETECTED Final   Influenza A NOT DETECTED NOT DETECTED Final   Influenza B DETECTED (A) NOT DETECTED Final   Parainfluenza Virus 1 NOT DETECTED NOT DETECTED Final   Parainfluenza Virus 2 NOT DETECTED NOT DETECTED Final   Parainfluenza Virus 3 NOT DETECTED NOT DETECTED Final   Parainfluenza Virus 4 NOT DETECTED NOT DETECTED Final   Respiratory Syncytial Virus NOT DETECTED NOT DETECTED Final   Bordetella pertussis NOT DETECTED NOT DETECTED Final   Chlamydophila pneumoniae NOT DETECTED NOT DETECTED Final    Mycoplasma pneumoniae NOT DETECTED NOT DETECTED Final  Culture, respiratory (NON-Expectorated)     Status: None (Preliminary result)   Collection Time: 12/27/16  7:50 AM  Result Value Ref Range Status   Specimen Description TRACHEAL ASPIRATE  Final   Special Requests NONE  Final   Gram Stain   Final    FEW WBC PRESENT, PREDOMINANTLY PMN RARE SQUAMOUS EPITHELIAL CELLS PRESENT FEW GRAM POSITIVE COCCI IN PAIRS    Culture   Final    FEW STAPHYLOCOCCUS AUREUS SUSCEPTIBILITIES TO FOLLOW    Report Status PENDING  Incomplete     Assessment: 46 yo male with acute encephalopathy s/p cardiac arrest on fortaz for PNA and noted with fever. Pharmacy consulted to dose vancomycin.  -tmax 102.4, WBC 18.9, PCT= 144.5>93 -SCr= 1.03 and CrCl ~ 90, UOP ~ 1.1 ml/kg/hr  Vanc 2/21>> Tamiflu 2/14 >> 2/19 Vanc 2/14>>2/16 CTX 2/14>>2/15 Ceftaz 2/15>> Zosyn 2/14 x1  2/14- influB 2/14 MRSA PCR- neg 2/14 resp- normal flora 2/14 urine-ng 2/14 blood x2-neg  Goal of Therapy:  Vancomycin trough level 15-20 mcg/ml  Plan:  -vancomycin 1500mg  IV x1 followed by 750mg  IV q8h -Will follow renal function, cultures and clinical progress  Harland German, Pharm D 12/28/2016 11:25 AM

## 2016-12-28 NOTE — Progress Notes (Signed)
Nutrition Follow-up  INTERVENTION:   Decrease Vital AF 1.2 to 25 ml/hr (600 ml/day) Increase Prostat to 60 ml TID Provides: 1320 kcal, 135 grams protein, and 486 ml free water  200 ml free water every 4 hours  Total free water: 1686 ml   NUTRITION DIAGNOSIS:   Inadequate oral intake related to inability to eat as evidenced by NPO statusOngoing.   GOAL:   Patient will meet greater than or equal to 90% of their needs Met.   MONITOR:   I & O's, Vent status  ASSESSMENT:   Pt with PMH of ETOH and polysubstance abuse, recently discharged from detox facility 8-10 days ago. Found in cardiac arrest, septic, normothermia, ards.  Patient is currently intubated on ventilator support MV: 13.9 L/min Temp (24hrs), Avg:99.5 F (37.5 C), Min:98.3 F (36.8 C), Max:102.4 F (39.1 C)  Propofol: 32.4 ml/hr provides: 855 kcal  Per MD prognosis poor, waiting on input from neuro Na 150 - free water added  Diet Order:  Diet NPO time specified Diet NPO time specified  Skin:  Reviewed, no issues  Last BM:  2/21 200 ml via rectal tube   Height:   Ht Readings from Last 1 Encounters:  12/23/16 6' 1"  (1.854 m)    Weight:   Wt Readings from Last 1 Encounters:  12/28/16 155 lb 6.8 oz (70.5 kg)    Ideal Body Weight:  83.6 kg  BMI:  Body mass index is 20.51 kg/m.  Estimated Nutritional Needs:   Kcal:  2194  Protein:  125-140 grams  Fluid:  > 2L/day  EDUCATION NEEDS:   No education needs identified at this time  [place], [place], [place] Pager [phone] After Hours Pager

## 2016-12-28 NOTE — Progress Notes (Addendum)
PULMONARY / CRITICAL CARE MEDICINE   Name: Troy Brennan MRN: [id number] DOB: [birth year]    ADMISSION DATE:  12/21/2016 CONSULTATION DATE:  12/21/2016  REFERRING MD:  Dr. Blinda Leatherwood   CHIEF COMPLAINT: Cardiac Arrest  HISTORY OF PRESENT ILLNESS:  46 year old male with ETOH and polysubstance abuse, recently discharged from detox facility 8-10 days ago. Found in cardiac arrest, septic, normothermia, ards.  SUBJECTIVE/interval:  Failed PSV   VITAL SIGNS: BP 109/77 (BP Location: Left Arm)   Pulse 63   Temp 99 F (37.2 C) (Core (Comment))   Resp (!) 30   Ht 6\' 1"  (1.854 m)   Wt 155 lb 6.8 oz (70.5 kg)   SpO2 97%   BMI 20.51 kg/m   HEMODYNAMICS:    VENTILATOR SETTINGS: Vent Mode: PRVC FiO2 (%):  [40 %] 40 % Set Rate:  [30 bmp] 30 bmp Vt Set:  [550 mL] 550 mL PEEP:  [8 cmH20] 8 cmH20 Plateau Pressure:  [23 cmH20-24 cmH20] 24 cmH20  INTAKE / OUTPUT: I/O last 3 completed shifts: In: 6795.6 [I.V.:3642.3; Other:40; WU/JW:1191.4; IV Piggyback:250] Out: 3465 [Urine:3265; Stool:200]  General appearance:  46 Year old  Male currently in acute distress after bedside weaning attempts, does not f/c Eyes: right eye w/ scleral hemorrhage let eye cloudy (chronic) , moist conjunctivae; PERRL, EOMI bilaterally. Mouth:  membranes and no mucosal ulcerations; normal hard and soft palate, orally intubated Neck: Trachea midline; neck supple, no JVD Lungs/chest: scattered rhonchi now w/ increased with respiratory effort  intercostal retractions after weaning attempt CV: tachy RRR, no MRGs  Abdomen: Soft, non-tender; no masses or HSM Extremities:diffuse anasarca/edema or extremity lymphadenopathy Skin: Normal temperature, turgor and texture; diffuse petechial rash, right gangrenous changes on hand persist.  Neuro/Psych:has cough, will move UEs non-specifically. Will not follow commands.   LABS:  BMET  Recent Labs Lab 12/26/16 0453 12/27/16 0500 12/28/16 0345  NA 152* 152* 150*   K 3.7 3.2* 3.8  CL 107 109 110  CO2 36* 34* 32  BUN 65* 60* 39*  CREATININE 1.36* 1.23 1.03  GLUCOSE 126* 127* 91    Electrolytes  Recent Labs Lab 12/23/16 1830 12/24/16 0415 12/25/16 0404 12/26/16 0453 12/27/16 0500 12/28/16 0345  CALCIUM  --  7.6* 8.0* 8.0* 8.0* 7.9*  MG 1.8 2.0 2.5* 2.6*  --   --   PHOS 4.2 2.3* 2.0*  --   --   --     CBC  Recent Labs Lab 12/26/16 0453 12/27/16 0500 12/28/16 0345  WBC 24.8* 21.8* 18.9*  HGB 12.4* 11.9* 11.9*  HCT 39.2 38.2* 38.3*  PLT 43* 53* 66*    Coag's  Recent Labs Lab 12/21/16 0914 12/21/16 1848 12/23/16 1027 12/25/16 0404  APTT 38* 45* 41*  --   INR 1.60 1.53 1.52 1.14    Sepsis Markers  Recent Labs Lab 12/21/16 0914 12/22/16 0420 12/23/16 0415  LATICACIDVEN 3.8*  --   --   PROCALCITON 49.60 144.50 93.45    ABG  Recent Labs Lab 12/24/16 1642 12/24/16 2200 12/25/16 0330  PHART 7.492* 7.425 7.465*  PCO2ART 44.9 51.9* 48.3*  PO2ART 62.0* 75.5* 73.0*    Liver Enzymes  Recent Labs Lab 12/24/16 0415 12/25/16 0404 12/28/16 0345  AST 468* 242* 55*  ALT 723* 528* 161*  ALKPHOS 48 59 56  BILITOT 0.9 0.8 0.4  ALBUMIN 2.2* 2.2* 2.1*    Cardiac Enzymes  Recent Labs Lab 12/21/16 1408 12/21/16 2030 12/22/16 0414  TROPONINI 0.21* 0.18* 0.18*  Glucose  Recent Labs Lab 12/27/16 0819 12/27/16 1125 12/27/16 1622 12/27/16 1931 12/28/16 0024 12/28/16 0740  GLUCAP 98 156* 185* 138* 111* 72    Imaging Mr Brain W Wo Contrast  Result Date: 12/28/2016 CLINICAL DATA:  Acute encephalopathy. Alcohol and polysubstance abuse. Recently discharged from detox facility. Status post cardiac arrest. EXAM: MRI HEAD WITHOUT AND WITH CONTRAST TECHNIQUE: Multiplanar, multiecho pulse sequences of the brain and surrounding structures were obtained without and with intravenous contrast. CONTRAST:  18mL MULTIHANCE GADOBENATE DIMEGLUMINE 529 MG/ML IV SOLN COMPARISON:  Head CT 12/21/2016 FINDINGS: Brain:  There is bilateral diffusion restriction within the basal ganglia. No other sites of abnormal diffusion are identified. There is associated bilateral basal ganglia hyperintense T2 weighted signal. The remainder of the brain parenchymal signal is normal. There is mild susceptibility within both basal ganglia. No mass lesion or midline shift. No hydrocephalus or extra-axial fluid collection. The midline structures are normal. No age advanced or lobar predominant atrophy. There is faint contrast enhancement at the sites of ischemia. Vascular: Major intracranial arterial and venous sinus flow voids are preserved. No evidence of chronic microhemorrhage or amyloid angiopathy. Skull and upper cervical spine: The visualized skull base, calvarium, upper cervical spine and extracranial soft tissues are normal. Sinuses/Orbits: No fluid levels or advanced mucosal thickening. No mastoid effusion. Normal orbits. IMPRESSION: 1. Bilateral acute to early subacute ischemia within the basal ganglia. This pattern is most consistent with hypoxic ischemic injury, particularly in the context of recent cardiac arrest. 2. Suspected petechial hemorrhage within both basal ganglia. No space-occupying hematoma. 3. No midline shift or other mass effect. Electronically Signed   By: Deatra Robinson M.D.   On: 12/28/2016 00:01  PCXR: aeration a little improved.   STUDIES:  CXR 2/14 > Diffuse bilateral airspace infiltrates, ? Edema, PNA, or Hemorrhagic Consolidation   CT Head 2/14 > neg 2/15 echo>>>15-20 diffuse low EF Hepatitis panel >>  MRI brain: 1. Bilateral acute to early subacute ischemia within the basal ganglia. This pattern is most consistent with hypoxic ischemic injury, particularly in the context of recent cardiac arrest. 2. Suspected petechial hemorrhage within both basal ganglia. No space-occupying hematoma. EEG 2/20: generalized slowing. Non-specific findings.   CULTURES: Blood 2/14 >neg Sputum 2/14 > normal  flora Urine 2/14 > negative RVP 2/14 >POS flu B Sputum 2/20: GPC pairs>>>  ANTIBIOTICS: Vancomycin 2/14 >>>2/16 Rocephin 2/14 >>> 2/15 ceftaz 2/15 >>  Tamiflu 2/14 >>> 2/19  SIGNIFICANT EVENTS: 2/14 > Presents to ED after being found pulseless/apneic at home 2/15 prone 2/17 supine, normothermia ongoing, PEEP / FiO2 better 2/19 . Normoothermia protocol completed. attempted to transition to precedex. Did not tolerated d/t increased RR and tachycardia 2/20 still not following commands. Get MRI brain, getting EEG. Starting seroquel I low dose clonazepam.  2/21 failed PSV attempt w/ diprivan and fent cut in half d/t increased RR and cough LINES/TUBES: ETT 2/14 >>  2/14 left IJ>>> 2 /14 a line rt rad>>>out   Impression/plan Resolved problems Septic shock Cardiac arrest AKI Shock liver   Current problems  NEURO Acute Encephalopathy s/p cardiac arrest. MRI & EEG raising concern for Anoxic injury.  -did respond to precedex.  -non-specific agitation is his major barrier to weaning efforts at this point.  Plan Cont supportive care.  Will increase seroquel and clonazepam in effort to wean off diprivan Will ask neuro to see and help weigh in on prognostics  PULMONARY Acute hypoxic respiratory failure in the setting of influenza B c/b CAP vs aspiration  PNA w/ on-going failure to wean d/t Encephalopathy  -vent mechanics have improved some. Was able to tolerate PSV briefly but agitation and cough forced [place] to stop weaning effort on 2/21 Plan Will cont full vent support Decreased Peep and resting Ve; will f/u  F/u CXR in am  Continue to titrate sedating medication in effort to support weaning Will need trach (need to discuss goals of care w/ family first)   CARDIOVASCULAR Acute systolic CM s/p arrest. EF 15-30% Plan Cont tele  Cont BB Add ASA after platelets > 100   DERM Dry gangrene changes of right hand Diffuse petechial rash-->not much different   Plan Observe  RENAL Hypernatremia  Plan Cont D5W at 75 ml/hr and cont free water Repeat Chem in al   HEMATOLOGY  Anemia of critical illness Thrombocytopenia (sepsis related)-->slowly improving Plan Cont PAS F/u am cbc Add LMW heparin and asa once PLTs > 100  GI Protein calorie malnutrition  Plan Resume tubefeeds  Cont PPI    INFECTIOUS DISEASE Influenza B PNA c/b CAP vs aspiration (NOS to date).  Plan F/u pending sputum.  Has completed tamiflu, now on day 6 of fortaz. PCTs were in the 90s will repeat PCT to help determine ABX stop date.    Discussion Really no improvement. MRI and EEG data raising significant concern for anoxic injury. His anoxic encephalopathy and agitation are his major barriers to weaning. Hemodynamically he is stable. His PLTs are slowly improving. His renal fxn has normalized. His Na/ water def is improving. Major next intervention is to sit down and talk w/ family, get neuro involved for prognostics and decide on where to go from here. Will need trach. The major concern here is the unknown as to how much neurological recovery will occur.   My critical care time 62 minutes.   Marland KitchenSimonne Martinet ACNP-BC [facility] Pulmonary/Critical Care Pager # [phone] OR # [phone] if no answer  STAFF NOTE: I, Rory Percy, MD FACP have personally reviewed patient's available data, including medical history, events of note, physical examination and test results as part of my evaluation. I have discussed with resident/NP and other care providers such as pharmacist, RN and RRT. In addition, I personally evaluated patient and elicited key findings of: no changes in neurostatus, ronchi bilateral  fever noted, MRI done bilateral basal ganglia cva noted, eeg done also slowing, pos 2.4 liters, Na slight down, central line ports not working x 2, need to dc line,  Few staph noted, ensure on vanc, repeat PCXR, would hold off on trach until we discuss prognosis with family  with mri findings insetting ETOH and current status, continued ceftaz empiric, weaning cpap 5 ps5, goal if able to reduce sedation, I updated family with NP, if for trach will need plat tx, no sig edema, na correction further, add d5w The patient is critically ill with multiple organ systems failure and requires high complexity decision making for assessment and support, frequent evaluation and titration of therapies, application of advanced monitoring technologies and extensive interpretation of multiple databases.   Critical Care Time devoted to patient care services described in this note is 35 Minutes. This time reflects time of care of this signee: Rory Percy, MD FACP. This critical care time does not reflect procedure time, or teaching time or supervisory time of PA/NP/Med student/Med Resident etc but could involve care discussion time. Rest per NP/medical resident whose note is outlined above and that I agree with   Mcarthur Rossetti. Tyson Alias, MD, FACP Pgr:  [phone] [place] Pulmonary & Critical [facility] 12/28/2016 9:50 AM

## 2016-12-29 ENCOUNTER — Inpatient Hospital Stay (HOSPITAL_COMMUNITY): Payer: Self-pay

## 2016-12-29 LAB — GLUCOSE, CAPILLARY
GLUCOSE-CAPILLARY: 89 mg/dL (ref 65–99)
GLUCOSE-CAPILLARY: 88 mg/dL (ref 65–99)
Glucose-Capillary: 105 mg/dL — ABNORMAL HIGH (ref 65–99)
Glucose-Capillary: 124 mg/dL — ABNORMAL HIGH (ref 65–99)
Glucose-Capillary: 60 mg/dL — ABNORMAL LOW (ref 65–99)

## 2016-12-29 LAB — CULTURE, RESPIRATORY

## 2016-12-29 LAB — BASIC METABOLIC PANEL
Anion gap: 8 (ref 5–15)
BUN: 34 mg/dL — AB (ref 6–20)
CALCIUM: 7.8 mg/dL — AB (ref 8.9–10.3)
CO2: 25 mmol/L (ref 22–32)
CREATININE: 0.88 mg/dL (ref 0.61–1.24)
Chloride: 115 mmol/L — ABNORMAL HIGH (ref 101–111)
Glucose, Bld: 86 mg/dL (ref 65–99)
Potassium: 3.9 mmol/L (ref 3.5–5.1)
SODIUM: 148 mmol/L — AB (ref 135–145)

## 2016-12-29 LAB — CULTURE, RESPIRATORY W GRAM STAIN

## 2016-12-29 LAB — CBC
HCT: 38.5 % — ABNORMAL LOW (ref 39.0–52.0)
Hemoglobin: 11.9 g/dL — ABNORMAL LOW (ref 13.0–17.0)
MCH: 27.7 pg (ref 26.0–34.0)
MCHC: 30.9 g/dL (ref 30.0–36.0)
MCV: 89.7 fL (ref 78.0–100.0)
PLATELETS: 75 10*3/uL — AB (ref 150–400)
RBC: 4.29 MIL/uL (ref 4.22–5.81)
RDW: 14.6 % (ref 11.5–15.5)
WBC: 13.6 10*3/uL — ABNORMAL HIGH (ref 4.0–10.5)

## 2016-12-29 LAB — TYPE AND SCREEN
ABO/RH(D): O NEG
Antibody Screen: NEGATIVE

## 2016-12-29 LAB — PROTIME-INR
INR: 1.15
PROTHROMBIN TIME: 14.7 s (ref 11.4–15.2)

## 2016-12-29 MED ORDER — PANTOPRAZOLE SODIUM 40 MG PO PACK
40.0000 mg | PACK | Freq: Every day | ORAL | Status: DC
  Administered 2016-12-29 – 2017-01-05 (×7): 40 mg
  Filled 2016-12-29 (×7): qty 20

## 2016-12-29 MED ORDER — DEXTROSE 50 % IV SOLN
INTRAVENOUS | Status: AC
  Administered 2016-12-29: 50 mL
  Filled 2016-12-29: qty 50

## 2016-12-29 MED ORDER — FUROSEMIDE 10 MG/ML IJ SOLN: 40.0000 mg | Freq: Two times a day (BID) | INTRAMUSCULAR | Status: DC

## 2016-12-29 MED ORDER — SODIUM CHLORIDE 0.9 % IV SOLN
Freq: Once | INTRAVENOUS | Status: AC
  Administered 2016-12-29: 09:00:00 via INTRAVENOUS

## 2016-12-29 MED ORDER — POTASSIUM CHLORIDE 20 MEQ/15ML (10%) PO SOLN
40.0000 meq | Freq: Once | ORAL | Status: AC
Start: 2016-12-29 — End: 2016-12-29
  Administered 2016-12-29: 40 meq
  Filled 2016-12-29: qty 30

## 2016-12-29 MED ORDER — FUROSEMIDE 10 MG/ML IJ SOLN
40.0000 mg | Freq: Two times a day (BID) | INTRAMUSCULAR | Status: DC
  Administered 2016-12-29 – 2017-01-02 (×9): 40 mg via INTRAVENOUS
  Filled 2016-12-29 (×9): qty 4

## 2016-12-29 MED ORDER — FUROSEMIDE 10 MG/ML IJ SOLN
40.0000 mg | Freq: Once | INTRAMUSCULAR | Status: AC
Start: 2016-12-29 — End: 2016-12-29
  Administered 2016-12-29: 40 mg via INTRAVENOUS
  Filled 2016-12-29: qty 4

## 2016-12-29 MED ORDER — FENTANYL CITRATE (PF) 100 MCG/2ML IJ SOLN: 25.0000 ug | INTRAMUSCULAR | Status: AC | PRN

## 2016-12-29 NOTE — Progress Notes (Signed)
PULMONARY / CRITICAL CARE MEDICINE   Name: Troy Brennan MRN: [id number] DOB: [birth year]    ADMISSION DATE:  12/21/2016 CONSULTATION DATE:  12/21/2016  REFERRING MD:  Dr. Blinda Leatherwood   CHIEF COMPLAINT: Cardiac Arrest  HISTORY OF PRESENT ILLNESS:  46 year old male with ETOH and polysubstance abuse, recently discharged from detox facility 8-10 days ago. Found in cardiac arrest, septic, normothermia, ards.  SUBJECTIVE/interval:  Sedated   VITAL SIGNS: BP 123/82   Pulse 71   Temp 98.6 F (37 C) (Core (Comment))   Resp (!) 24   Ht 6\' 1"  (1.854 m)   Wt 181 lb 7 oz (82.3 kg) Comment: with cooling blanket on bed  SpO2 99%   BMI 23.94 kg/m   HEMODYNAMICS:    VENTILATOR SETTINGS: Vent Mode: PRVC FiO2 (%):  [40 %] 40 % Set Rate:  [24 bmp] 24 bmp Vt Set:  [550 mL] 550 mL PEEP:  [5 cmH20] 5 cmH20 Plateau Pressure:  [19 cmH20-25 cmH20] 19 cmH20  INTAKE / OUTPUT:  Intake/Output Summary (Last 24 hours) at 12/29/16 0852 Last data filed at 12/29/16 0800  Gross per 24 hour  Intake          4566.35 ml  Output             2625 ml  Net          1941.35 ml    General appearance:  46 Year old  Male acute on chronically ill appearing. Sedated currently but appears comfortable  Eyes: right eye w/ injected hemorrhagic sclera, left eye is clouded, moist conjunctivae; Mouth:  membranes and no mucosal ulcerations; normal hard and soft palate, orally intubated Neck: Trachea midline; neck supple, no JVD Lungs/chest: clear, with normal respiratory effort and no intercostal retractions CV: RRR, no MRGs  Abdomen: Soft, non-tender; no masses or HSM Extremities: diffuse anasarca,warm good pulses Skin:diffuse petechial  Rash, gangrenous changes on right hand unchanged.  Neuro/Psych: opens eyes, moves upper extremities. Sedated.     LABS:  BMET  Recent Labs Lab 12/27/16 0500 12/28/16 0345 12/29/16 0245  NA 152* 150* 148*  K 3.2* 3.8 3.9  CL 109 110 115*  CO2 34* 32 25  BUN 60*  39* 34*  CREATININE 1.23 1.03 0.88  GLUCOSE 127* 91 86    Electrolytes  Recent Labs Lab 12/23/16 1830 12/24/16 0415 12/25/16 0404 12/26/16 0453 12/27/16 0500 12/28/16 0345 12/29/16 0245  CALCIUM  --  7.6* 8.0* 8.0* 8.0* 7.9* 7.8*  MG 1.8 2.0 2.5* 2.6*  --   --   --   PHOS 4.2 2.3* 2.0*  --   --   --   --     CBC  Recent Labs Lab 12/27/16 0500 12/28/16 0345 12/29/16 0245  WBC 21.8* 18.9* 13.6*  HGB 11.9* 11.9* 11.9*  HCT 38.2* 38.3* 38.5*  PLT 53* 66* 75*    Coag's  Recent Labs Lab 12/23/16 1027 12/25/16 0404 12/29/16 0535  APTT 41*  --   --   INR 1.52 1.14 1.15    Sepsis Markers  Recent Labs Lab 12/23/16 0415  PROCALCITON 93.45    ABG  Recent Labs Lab 12/24/16 2200 12/25/16 0330 12/28/16 1230  PHART 7.425 7.465* 7.403  PCO2ART 51.9* 48.3* 49.3*  PO2ART 75.5* 73.0* 97.6    Liver Enzymes  Recent Labs Lab 12/24/16 0415 12/25/16 0404 12/28/16 0345  AST 468* 242* 55*  ALT 723* 528* 161*  ALKPHOS 48 59 56  BILITOT 0.9 0.8 0.4  ALBUMIN 2.2*  2.2* 2.1*    Cardiac Enzymes No results for input(s): TROPONINI, PROBNP in the last 168 hours.  Glucose  Recent Labs Lab 12/28/16 0740 12/28/16 1143 12/28/16 1535 12/28/16 1955 12/28/16 2357 12/29/16 0735  GLUCAP 72 144* 131* 88 86 105*    Imaging Dg Chest Port 1 View  Result Date: 12/29/2016 CLINICAL DATA:  Cardiac arrest. EXAM: PORTABLE CHEST 1 VIEW COMPARISON:  One-view chest x-ray 12/28/2016 FINDINGS: Endotracheal tube is in satisfactory position 4 cm above the carina. Small bore feeding tube courses off the inferior border of the film. The heart size normal. The patient is slightly rotated. Increasing interstitial and airspace disease is present bilaterally, right greater left. Detail is somewhat obscured by a grid pattern on the right, external to the patient. IMPRESSION: 1. Increasing interstitial and airspace disease concerning for edema or infection, right greater than left. 2.  Support apparatus is stable. Electronically Signed   By: Marin Roberts M.D.   On: 12/29/2016 07:15   Dg Chest Port 1 View  Result Date: 12/28/2016 CLINICAL DATA:  Hypoxia EXAM: PORTABLE CHEST 1 VIEW COMPARISON:  December 27, 2016 FINDINGS: Endotracheal tube tip is 4.7 cm above the carina. Central catheter tip is in the superior vena cava. Feeding tube tip is below the diaphragm. No pneumothorax. There is subtle patchy opacity in the right mid lung. Lungs elsewhere clear. Heart size and pulmonary vascularity are normal. No adenopathy. No bone lesions. IMPRESSION: Tube and catheter positions as described without evident pneumothorax. Subtle infiltrate right mid lung, likely early pneumonia. Lungs elsewhere clear. Cardiac silhouette within normal limits. Electronically Signed   By: Bretta Bang III M.D.   On: 12/28/2016 10:43  PCXR: aeration a little worse R>L.   STUDIES:  CXR 2/14 > Diffuse bilateral airspace infiltrates, ? Edema, PNA, or Hemorrhagic Consolidation   CT Head 2/14 > neg 2/15 echo>>>15-20 diffuse low EF Hepatitis panel >>  MRI brain: 1. Bilateral acute to early subacute ischemia within the basal ganglia. This pattern is most consistent with hypoxic ischemic injury, particularly in the context of recent cardiac arrest. 2. Suspected petechial hemorrhage within both basal ganglia. No space-occupying hematoma. EEG 2/20: generalized slowing. Non-specific findings.   CULTURES: Blood 2/14 >neg Sputum 2/14 > normal flora Urine 2/14 > negative RVP 2/14 >POS flu B Sputum 2/20: SA>>>  ANTIBIOTICS: Vancomycin 2/14 >>>2/16; Vanc 2/21>>> Rocephin 2/14 >>> 2/15 ceftaz 2/15 >>  Tamiflu 2/14 >>> 2/19  SIGNIFICANT EVENTS: 2/14 > Presents to ED after being found pulseless/apneic at home 2/15 prone 2/17 supine, normothermia ongoing, PEEP / FiO2 better 2/19 . Normoothermia protocol completed. attempted to transition to precedex. Did not tolerated d/t increased RR and  tachycardia 2/20 still not following commands. Get MRI brain, getting EEG. Starting seroquel I low dose clonazepam.  2/21 failed PSV attempt w/ diprivan and fent cut in half d/t increased RR and cough. Family decided on trach. Did make DNR, started Vanc for SA PNA 2/22 platelets for trach, trach at 1400  LINES/TUBES: ETT 2/14 >>  2/14 left IJ>>> 2 /14 a line rt rad>>>out   Impression/plan Resolved problems Septic shock Cardiac arrest Influenza B Aspiration PNA vs CAP (NOS) AKI Shock liver   Current problems  NEURO Anoxic Encephalopathy  Bilateral Basal ganglial stroke  Plan Cont Clonazepam & seroquel  Dc lactulose Stopped steroids 2/21 For trach today, hope this should allow [place] to wean diprivan and fentanyl   PULMONARY Acute hypoxic respiratory failure  HCAP (growing SA from sputum on 2/20) Failure to  wean  Plan Cont full vent support Lasix X 1 abx per below in ID section  For trach today (2/22), hope to resume more aggressive weaning efforts after trach and w/ lower sedating medication regimen    CARDIOVASCULAR Acute systolic CM s/p Cardiac arrest EF 15-20% Plan Cont tele  Cont BB  Daily assessment for lasix  Add asa 2/23 if PLTs cont to rise   DERM Dry gangrene changes of right hand Diffuse petechial rash Stable  Plan Observe   RENAL Hypernatremia  Anasarca Plan Cont D5W and free water at current Lasix X 1 w/ Potassium supp F/u chem   HEMATOLOGY  Anemia of critical illness Sepsis related thrombocytopenia  Plan Cont PAS F/u am cbc Start LMWH once PLTs > 100 PLTs x1 today for pending trach   GI Protein calorie malnutrition  Diarrhea Plan Dc lactulose Cont tube feeds  INFECTIOUS DISEASE HCAP (growing SA in sputum from 2/20) -vanc started 2/21 Plan Cont vanc day 1/X as we await sensitivities Day 7/x fortaz  If grows MRSA will stop fortaz   Discussion 46 year old male w/ anoxic injury s/p cardiopulmonary arrest after Influenza B +  CAP vs aspiration. Moving all extremities but does not f/c.now w/ SA PNA for which vanc was started 2/21. Seen by neuro. Hopeful for further neurologic recovery. Spoke w/ family at great length they would like to go ahead w/ trach in hopes that further time and decreasing sedating meds will yield better neuro exam.  For today: Trach Cont abx Lasix x1 F/u pcxr in am  My critical care time 55 minutes  Begin to more aggressively titrate sedating meds after trach  Simonne Martinet ACNP-BC [facility] Pulmonary/Critical Care Pager # [phone] OR # [phone] if no answer  12/29/2016 8:33 AM   STAFF NOTE: IRory Percy, MD FACP have personally reviewed patient's available data, including medical history, events of note, physical examination and test results as part of my evaluation. I have discussed with resident/NP and other care providers such as pharmacist, RN and RRT. In addition, I personally evaluated patient and elicited key findings of: not awake, does moves ext and head, NOT fc, lungs reduced coarse bases, concerned about secretions and increased rt infiltrates = VAP, during trach bronch will lavage RML to ensure mrsa is organism, continued VAp coverage - vanc, dc ceftaz with MRSA, kidney fxn improving, tx plat for trach to goal greater 100k , appreciate neurology input will have trach and peg, consider lasix to maintain to get balance and follow pcxr, coags wnl pre trach, dc probiotic, wean atempts cpap 5 ps 10, goal to dc prop post tracgh The patient is critically ill with multiple organ systems failure and requires high complexity decision making for assessment and support, frequent evaluation and titration of therapies, application of advanced monitoring technologies and extensive interpretation of multiple databases.   Critical Care Time devoted to patient care services described in this note is 35 Minutes. This time reflects time of care of this signee: Rory Percy, MD FACP. This  critical care time does not reflect procedure time, or teaching time or supervisory time of PA/NP/Med student/Med Resident etc but could involve care discussion time. Rest per NP/medical resident whose note is outlined above and that I agree with   Mcarthur Rossetti. Tyson Alias, MD, FACP Pgr: [phone] [place] Pulmonary [facility] Critical [facility] 12/29/2016 9:18 AM

## 2016-12-29 NOTE — Procedures (Signed)
Name:  Troy Brennan MRN:  [id number] DOB:  1971-09-20  OPERATIVE NOTE  Procedure:  Percutaneous tracheostomy.  Indications:  Ventilator-dependent respiratory failure.  Consent:  Procedure, alternatives, risks and benefits discussed with medical POA.  Questions answered.  Consent obtained.  Anesthesia:  Roc, prop, fent, versed 2 mg, Plat unit prior  Procedure summary:  Appropriate equipment was assembled.  The patient was identified as Troy Brennan and safety timeout was performed. The patient was placed in supine position with a towel roll behind shoulder blades and neck extended.  Sterile technique was used. The patient's neck and upper chest were prepped using chlorhexidine / alcohol scrub and the field was draped in usual sterile fashion with full body drape. After the adequate sedation / anesthesia was achieved, attention was directed at the midline trachea, where the cricothyroid membrane was palpated. Approximately two fingerbreadths above the sternal notch, a verticle incision was created with a scalpel after local infiltration with 0.2% Lidocaine. Then, using Seldinger technique and a percutaneous tracheostomy set, the trachea was entered with a 14 gauge needle with an overlying sheath. This was all confirmed under direct visualization of a fiberoptic flexible bronchoscope. Entrance into the trachea was identified through the third tracheal ring interspace. Following this, a guidewire was inserted. The needle was removed, leaving the sheath and the guidewire intact. Next, the sheath was removed and a small dilator was inserted. The tracheal rings were then dilated. A #8 Shiley was then opened. The balloon was checked. It was placed over a tracheal dilator, which was then advanced over the guidewire and through the previously dilated tract. The Shiley tracheostomy tube was noted to pass in the trachea with little resistance. The guidewire and dilator tubes were removed from the  trachea. An inner cannula was placed through the tracheostomy tube. The tracheostomy was then secured at the anterior neck with 4 monofilament sutures. The oral endotracheal tube was removed and the ventilator was attached to the newly placed tracheostomy tube. Adequate tidal volumes were noted. The cuff was inflated and no evidence of air leak was noted. No evidence of bleeding was noted. At this point, the procedure was concluded. Post-procedure chest x-ray was ordered.  Complications:  No immediate complications were noted.  Hemodynamic parameters and oxygenation remained stable throughout the procedure.  Estimated blood loss:  Less then 1 mL.  [name]., MD Pulmonary and Critical Care Medicine [facility] Pager: [phone]  12/29/2016, 2:46 PM   Should follow up in [facility] clinic [phone]

## 2016-12-29 NOTE — Progress Notes (Signed)
Subjective: Family has decided to pursue trach today. Patient turned head away when I checked his pupillary reflex with light. No movement to painful stimuli.   Objective: Current vital signs: BP 118/79   Pulse 64   Temp 98.6 F (37 C) (Core (Comment))   Resp (!) 24   Ht 6\' 1"  (1.854 m)   Wt 181 lb 7 oz (82.3 kg) Comment: with cooling blanket on bed  SpO2 98%   BMI 23.94 kg/m  Vital signs in last 24 hours: Temp:  [98.6 F (37 C)-102.4 F (39.1 C)] 98.6 F (37 C) (02/22 0700) Pulse Rate:  [59-117] 64 (02/22 0700) Resp:  [13-39] 24 (02/22 0700) BP: (101-124)/(47-95) 118/79 (02/22 0700) SpO2:  [89 %-100 %] 98 % (02/22 0700) FiO2 (%):  [40 %] 40 % (02/22 0245) Weight:  [181 lb 7 oz (82.3 kg)] 181 lb 7 oz (82.3 kg) (02/22 0500)  Intake/Output from previous day: 02/21 0701 - 02/22 0700 In: 4799.2 [I.V.:2751.4; ZO/XW:9604.5; IV Piggyback:450] Out: 2975 [Urine:2775; Stool:200] Intake/Output this shift: No intake/output data recorded. Nutritional status: Diet NPO time specified  ROS:                                                                                                                                       History obtained from unobtainable from patient due to mental status  Neurologic Exam: General: NAD Mental Status: Sedated on vent, does not follow commands  Cranial Nerves: Right pupil sluggish, but reactive Left corneal scaring from previous injury Corneals present bilaterally Gag reflex intact No blink to visual threat  Motor: Tone diminished throughout Sensory: No response to painful stimulation  Deep Tendon Reflexes:  3+ in upper extremities Trace in knees   Plantars: No response  Cerebellar: Unable to assess Gait: Unable to assess   Lab Results: Basic Metabolic Panel:  Recent Labs Lab 12/23/16 1027 12/23/16 1830 12/24/16 0415 12/25/16 0404 12/26/16 0453 12/27/16 0500 12/28/16 0345 12/29/16 0245  NA  --   --  141 144 152* 152* 150*  148*  K  --   --  4.0 3.5 3.7 3.2* 3.8 3.9  CL  --   --  102 104 107 109 110 115*  CO2  --   --  28 32 36* 34* 32 25  GLUCOSE  --   --  167* 159* 126* 127* 91 86  BUN  --   --  55* 58* 65* 60* 39* 34*  CREATININE  --   --  1.68* 1.27* 1.36* 1.23 1.03 0.88  CALCIUM  --   --  7.6* 8.0* 8.0* 8.0* 7.9* 7.8*  MG 1.7 1.8 2.0 2.5* 2.6*  --   --   --   PHOS 3.1 4.2 2.3* 2.0*  --   --   --   --     Liver Function Tests:  Recent Labs Lab 12/23/16 0415 12/24/16 0415 12/25/16 0404  12/28/16 0345  AST 1,082* 468* 242* 55*  ALT 1,199* 723* 528* 161*  ALKPHOS 37* 48 59 56  BILITOT 0.9 0.9 0.8 0.4  PROT 5.1* 5.3* 5.6* 5.6*  ALBUMIN 2.2* 2.2* 2.2* 2.1*    CBC:  Recent Labs Lab 12/23/16 0415 12/24/16 0415  12/25/16 0404 12/26/16 0453 12/27/16 0500 12/28/16 0345 12/29/16 0245  WBC 11.6* 10.2  < > 17.1* 24.8* 21.8* 18.9* 13.6*  NEUTROABS 10.9* 8.9*  --   --   --   --   --   --   HGB 13.4 12.4*  < > 12.7* 12.4* 11.9* 11.9* 11.9*  HCT 40.7 37.0*  < > 37.7* 39.2 38.2* 38.3* 38.5*  MCV 84.4 84.5  < > 84.2 87.5 88.4 89.7 89.7  PLT 38* 32*  < > 31* 43* 53* 66* 75*  < > = values in this interval not displayed.   Lipid Panel:  Recent Labs Lab 12/24/16 0036 12/27/16 0500  TRIG 201* 369*    CBG:  Recent Labs Lab 12/28/16 1143 12/28/16 1535 12/28/16 1955 12/28/16 2357 12/29/16 0735  GLUCAP 144* 131* 88 86 105*    Microbiology: Results for orders placed or performed during the hospital encounter of 12/21/16  Culture, blood (Routine X 2) w Reflex to ID Panel     Status: None   Collection Time: 12/21/16  6:10 AM  Result Value Ref Range Status   Specimen Description BLOOD LEFT WRIST  Final   Special Requests BOTTLES DRAWN AEROBIC ONLY  5CC  Final   Culture NO GROWTH 5 DAYS  Final   Report Status 12/26/2016 FINAL  Final  Culture, blood (Routine X 2) w Reflex to ID Panel     Status: None   Collection Time: 12/21/16  6:18 AM  Result Value Ref Range Status   Specimen  Description BLOOD LEFT HAND  Final   Special Requests IN PEDIATRIC BOTTLE  3CC  Final   Culture NO GROWTH 5 DAYS  Final   Report Status 12/26/2016 FINAL  Final  Urine culture     Status: None   Collection Time: 12/21/16  6:46 AM  Result Value Ref Range Status   Specimen Description URINE, CATHETERIZED  Final   Special Requests NONE  Final   Culture NO GROWTH  Final   Report Status 12/22/2016 FINAL  Final  Culture, respiratory (NON-Expectorated)     Status: None   Collection Time: 12/21/16 10:12 AM  Result Value Ref Range Status   Specimen Description TRACHEAL ASPIRATE  Final   Special Requests NONE  Final   Gram Stain   Final    FEW WBC PRESENT, PREDOMINANTLY PMN ABUNDANT GRAM POSITIVE COCCI ABUNDANT GRAM NEGATIVE RODS FEW YEAST    Culture Consistent with normal respiratory flora.  Final   Report Status 12/23/2016 FINAL  Final  MRSA PCR Screening     Status: None   Collection Time: 12/21/16 10:14 AM  Result Value Ref Range Status   MRSA by PCR NEGATIVE NEGATIVE Final    Comment:        The GeneXpert MRSA Assay (FDA approved for NASAL specimens only), is one component of a comprehensive MRSA colonization surveillance program. It is not intended to diagnose MRSA infection nor to guide or monitor treatment for MRSA infections.   Respiratory Panel by PCR     Status: Abnormal   Collection Time: 12/21/16 11:35 AM  Result Value Ref Range Status   Adenovirus NOT DETECTED NOT DETECTED Final   Coronavirus 229E NOT  DETECTED NOT DETECTED Final   Coronavirus HKU1 NOT DETECTED NOT DETECTED Final   Coronavirus NL63 NOT DETECTED NOT DETECTED Final   Coronavirus OC43 NOT DETECTED NOT DETECTED Final   Metapneumovirus NOT DETECTED NOT DETECTED Final   Rhinovirus / Enterovirus NOT DETECTED NOT DETECTED Final   Influenza A NOT DETECTED NOT DETECTED Final   Influenza B DETECTED (A) NOT DETECTED Final   Parainfluenza Virus 1 NOT DETECTED NOT DETECTED Final   Parainfluenza Virus 2 NOT  DETECTED NOT DETECTED Final   Parainfluenza Virus 3 NOT DETECTED NOT DETECTED Final   Parainfluenza Virus 4 NOT DETECTED NOT DETECTED Final   Respiratory Syncytial Virus NOT DETECTED NOT DETECTED Final   Bordetella pertussis NOT DETECTED NOT DETECTED Final   Chlamydophila pneumoniae NOT DETECTED NOT DETECTED Final   Mycoplasma pneumoniae NOT DETECTED NOT DETECTED Final  Culture, respiratory (NON-Expectorated)     Status: None (Preliminary result)   Collection Time: 12/27/16  7:50 AM  Result Value Ref Range Status   Specimen Description TRACHEAL ASPIRATE  Final   Special Requests NONE  Final   Gram Stain   Final    FEW WBC PRESENT, PREDOMINANTLY PMN RARE SQUAMOUS EPITHELIAL CELLS PRESENT FEW GRAM POSITIVE COCCI IN PAIRS    Culture   Final    FEW STAPHYLOCOCCUS AUREUS SUSCEPTIBILITIES TO FOLLOW    Report Status PENDING  Incomplete    Coagulation Studies:  Recent Labs  12/29/16 0535  LABPROT 14.7  INR 1.15    Imaging: Mr Laqueta Jean ZO Contrast  Result Date: 12/28/2016 CLINICAL DATA:  Acute encephalopathy. Alcohol and polysubstance abuse. Recently discharged from detox facility. Status post cardiac arrest. EXAM: MRI HEAD WITHOUT AND WITH CONTRAST TECHNIQUE: Multiplanar, multiecho pulse sequences of the brain and surrounding structures were obtained without and with intravenous contrast. CONTRAST:  18mL MULTIHANCE GADOBENATE DIMEGLUMINE 529 MG/ML IV SOLN COMPARISON:  Head CT 12/21/2016 FINDINGS: Brain: There is bilateral diffusion restriction within the basal ganglia. No other sites of abnormal diffusion are identified. There is associated bilateral basal ganglia hyperintense T2 weighted signal. The remainder of the brain parenchymal signal is normal. There is mild susceptibility within both basal ganglia. No mass lesion or midline shift. No hydrocephalus or extra-axial fluid collection. The midline structures are normal. No age advanced or lobar predominant atrophy. There is faint  contrast enhancement at the sites of ischemia. Vascular: Major intracranial arterial and venous sinus flow voids are preserved. No evidence of chronic microhemorrhage or amyloid angiopathy. Skull and upper cervical spine: The visualized skull base, calvarium, upper cervical spine and extracranial soft tissues are normal. Sinuses/Orbits: No fluid levels or advanced mucosal thickening. No mastoid effusion. Normal orbits. IMPRESSION: 1. Bilateral acute to early subacute ischemia within the basal ganglia. This pattern is most consistent with hypoxic ischemic injury, particularly in the context of recent cardiac arrest. 2. Suspected petechial hemorrhage within both basal ganglia. No space-occupying hematoma. 3. No midline shift or other mass effect. Electronically Signed   By: Deatra Robinson M.D.   On: 12/28/2016 00:01   Dg Chest Port 1 View  Result Date: 12/29/2016 CLINICAL DATA:  Cardiac arrest. EXAM: PORTABLE CHEST 1 VIEW COMPARISON:  One-view chest x-ray 12/28/2016 FINDINGS: Endotracheal tube is in satisfactory position 4 cm above the carina. Small bore feeding tube courses off the inferior border of the film. The heart size normal. The patient is slightly rotated. Increasing interstitial and airspace disease is present bilaterally, right greater left. Detail is somewhat obscured by a grid pattern on the right,  external to the patient. IMPRESSION: 1. Increasing interstitial and airspace disease concerning for edema or infection, right greater than left. 2. Support apparatus is stable. Electronically Signed   By: Marin Roberts M.D.   On: 12/29/2016 07:15   Dg Chest Port 1 View  Result Date: 12/28/2016 CLINICAL DATA:  Hypoxia EXAM: PORTABLE CHEST 1 VIEW COMPARISON:  December 27, 2016 FINDINGS: Endotracheal tube tip is 4.7 cm above the carina. Central catheter tip is in the superior vena cava. Feeding tube tip is below the diaphragm. No pneumothorax. There is subtle patchy opacity in the right mid lung.  Lungs elsewhere clear. Heart size and pulmonary vascularity are normal. No adenopathy. No bone lesions. IMPRESSION: Tube and catheter positions as described without evident pneumothorax. Subtle infiltrate right mid lung, likely early pneumonia. Lungs elsewhere clear. Cardiac silhouette within normal limits. Electronically Signed   By: Bretta Bang III M.D.   On: 12/28/2016 10:43    Medications: I have reviewed the patient's current medications.  Assessment/Plan:  1. Anoxic brain Injury s/p cardiac arrest: 2. Acute encephalopathy s/p cardiac arrest 3. Acute hypoxic respiratory failure 2/2 influenza B with concurrent CAP vs aspiration PNA 4. Systolic heart failure s/p cardiac arrest, EF 15-20% 5. Hep C positive antibody 6. Thrombocytopenia 7. Polysubstance and alcohol abuse 8. Protein calorie malnutrition  Getting trach this afternoon for further ongoing medical support. This will allow [place] more time to evaluate his neurological recovery and hopefully get a better indication of his prognosis moving forward. We will continue to follow.   Attending note to follow  Rich Number, MD, MPH Internal Medicine Resident, PGY-III Pager: [phone] 12/29/2016, 7:55 AM

## 2016-12-29 NOTE — Progress Notes (Signed)
Hypoglycemic Event  CBG: 60  Treatment: D50 IV 50 mL  Symptoms: None  Follow-up CBG: Time: 2110 CBG Result:124   Possible Reasons for Event: Inadequate meal intake Paused Tfs.     Troy Brennan

## 2016-12-29 NOTE — Progress Notes (Signed)
Pt for trach today. Remains on vent. No fam in room. Pt is listed as self pay. Trying to find out if has ins as this will impact dc planning. Have made sw ref to have on board also.

## 2016-12-29 NOTE — Progress Notes (Signed)
Pt. Received a size 8 french trach at 15:00.  Patient received 4 mg of Versed and 50 mg of Rocuronium. I drew up 100 mcg of Fentanyl but did not need it so I wasted it in the sharps with Shelba Flake, RN.  Tracheostomy kit returned to pharmacy, none of its drugs were used.  Procedure was successful, will continue to monitor closely.

## 2016-12-29 NOTE — Procedures (Signed)
Bronchoscopy Procedure Note Troy Brennan [id number] [birth year]  Procedure: Bronchoscopy Indications: Remove secretions and trach placement   Procedure Details Consent: Risks of procedure as well as the alternatives and risks of each were explained to the (patient/caregiver).  Consent for procedure obtained. Time Out: Verified patient identification, verified procedure, site/side was marked, verified correct patient position, special equipment/implants available, medications/allergies/relevent history reviewed, required imaging and test results available.  Performed  In preparation for procedure, patient was given 100% FiO2 and bronchoscope lubricated. Sedation: Benzodiazepines and Muscle relaxants  Airway entered and the following bronchi were examined: RUL, RML and RLL.   Procedures performed: Brushings performed Bronchoscope removed.   Procedure done by Troy Brennan ACNP-BC, under direct supervision of Dr Tyson Alias. At first bronch was introduce through ET tube and structures of tracheal rings, carina identified for operator of tracheostomy who was Dr Tyson Alias. Light of bronch passed through trachea and skin for indentification of tracheal rings for tracheostomy puncture. After this, under bronchoscopy guidance,  ET tube was pulled back sufficiently and very carefully. The ET tube was  pulled back enough to give room for tracheostomy operator and yet at same time to to ensure a secured airway. After this was accomplished, bronchoscope was withdrawn into the ET tube. After this,  Dr Tyson Alias  then performed tracheostomy under video visual provided by flexible video bronchoscopy. Followng introduction of tracheostomy,  the bronchoscope was removed from ET tube and introduced through tracheostomy. Correct position of tracheostomy was ensured, with enough room between carina and distal tracheostomy and no evidence of bleeding. The bronchoscope was then withdrawn. Respiratory therapist was then  instructed to remove the ET tube.  Dr Tyson Alias then proceeded to complete the tracheostomy with stay sutures   No complications  Evaluation Hemodynamic Status: BP stable throughout; O2 sats: stable throughout Patient's Current Condition: stable  Patient did tolerate procedure well.   Shelby Mattocks 12/29/2016   Supervised entire procedure also manipulated scope to assess airways Appears like flu mucosal slothing Mucous polugging Ulcerations  Used bronch to assess entire trach procedure, toelared without post wall imnjury  Mcarthur Rossetti. Tyson Alias, MD, FACP Pgr: [phone] [facility]

## 2016-12-30 ENCOUNTER — Inpatient Hospital Stay (HOSPITAL_COMMUNITY): Payer: Self-pay

## 2016-12-30 LAB — PREPARE PLATELET PHERESIS
BLOOD PRODUCT EXPIRATION DATE: 201802231252
ISSUE DATE / TIME: 201802221301
UNIT TYPE AND RH: 8400

## 2016-12-30 LAB — GLUCOSE, CAPILLARY
GLUCOSE-CAPILLARY: 86 mg/dL (ref 65–99)
GLUCOSE-CAPILLARY: 191 mg/dL — AB (ref 65–99)
GLUCOSE-CAPILLARY: 112 mg/dL — AB (ref 65–99)
GLUCOSE-CAPILLARY: 94 mg/dL (ref 65–99)
Glucose-Capillary: 103 mg/dL — ABNORMAL HIGH (ref 65–99)
Glucose-Capillary: 105 mg/dL — ABNORMAL HIGH (ref 65–99)
Glucose-Capillary: 114 mg/dL — ABNORMAL HIGH (ref 65–99)
Glucose-Capillary: 65 mg/dL (ref 65–99)
Glucose-Capillary: 77 mg/dL (ref 65–99)

## 2016-12-30 LAB — CBC
HEMATOCRIT: 40 % (ref 39.0–52.0)
HEMOGLOBIN: 12.9 g/dL — AB (ref 13.0–17.0)
MCH: 28.5 pg (ref 26.0–34.0)
MCHC: 32.3 g/dL (ref 30.0–36.0)
MCV: 88.5 fL (ref 78.0–100.0)
Platelets: 124 10*3/uL — ABNORMAL LOW (ref 150–400)
RBC: 4.52 MIL/uL (ref 4.22–5.81)
RDW: 14.1 % (ref 11.5–15.5)
WBC: 12.7 10*3/uL — ABNORMAL HIGH (ref 4.0–10.5)

## 2016-12-30 LAB — BASIC METABOLIC PANEL
ANION GAP: 9 (ref 5–15)
BUN: 37 mg/dL — ABNORMAL HIGH (ref 6–20)
CALCIUM: 8.4 mg/dL — AB (ref 8.9–10.3)
CO2: 28 mmol/L (ref 22–32)
CREATININE: 0.88 mg/dL (ref 0.61–1.24)
Chloride: 110 mmol/L (ref 101–111)
GFR calc non Af Amer: >60 mL/min (ref >60)
Glucose, Bld: 80 mg/dL (ref 65–99)
Potassium: 4.3 mmol/L (ref 3.5–5.1)
SODIUM: 147 mmol/L — AB (ref 135–145)

## 2016-12-30 LAB — TRIGLYCERIDES: Triglycerides: 699 mg/dL — ABNORMAL HIGH (ref <150)

## 2016-12-30 LAB — VANCOMYCIN, TROUGH: VANCOMYCIN TR: 18 ug/mL (ref 15–20)

## 2016-12-30 MED ORDER — IBUPROFEN 100 MG/5ML PO SUSP
400.0000 mg | Freq: Four times a day (QID) | ORAL | Status: DC | PRN
  Administered 2016-12-31 – 2017-01-01 (×2): 400 mg
  Filled 2016-12-30 (×5): qty 20

## 2016-12-30 MED ORDER — DEXTROSE 5 % IV SOLN
INTRAVENOUS | Status: DC
  Administered 2016-12-30 – 2016-12-31 (×2): via INTRAVENOUS

## 2016-12-30 MED ORDER — QUETIAPINE FUMARATE 100 MG PO TABS
200.0000 mg | ORAL_TABLET | Freq: Three times a day (TID) | ORAL | Status: DC
  Administered 2016-12-30 – 2017-01-02 (×7): 200 mg
  Filled 2016-12-30 (×8): qty 2

## 2016-12-30 MED ORDER — FENTANYL CITRATE (PF) 100 MCG/2ML IJ SOLN
100.0000 ug | INTRAMUSCULAR | Status: DC
  Administered 2016-12-30 – 2017-01-02 (×20): 100 ug via INTRAVENOUS
  Filled 2016-12-30 (×19): qty 2

## 2016-12-30 MED ORDER — IBUPROFEN 100 MG/5ML PO SUSP
400.0000 mg | Freq: Four times a day (QID) | ORAL | Status: DC | PRN
Start: 2016-12-30 — End: 2016-12-30
  Administered 2016-12-30: 400 mg via ORAL
  Filled 2016-12-30 (×3): qty 20

## 2016-12-30 MED ORDER — FENTANYL 25 MCG/HR TD PT72
75.0000 ug | MEDICATED_PATCH | TRANSDERMAL | Status: DC
  Administered 2016-12-30: 75 ug via TRANSDERMAL
  Administered 2017-01-02: 25 ug via TRANSDERMAL
  Filled 2016-12-30 (×2): qty 3
  Filled 2016-12-30: qty 1

## 2016-12-30 MED ORDER — DEXTROSE 50 % IV SOLN
INTRAVENOUS | Status: AC
  Administered 2016-12-30: 50 mL
  Filled 2016-12-30: qty 50

## 2016-12-30 NOTE — Care Management Note (Signed)
Case Management Note  Patient Details  Name: Troy Brennan MRN: [id number] Date of Birth: November 17, 1970  Subjective/Objective:     Adm w cardiac arrest, vent and trach               Action/Plan:was at home alone prior to arrest. He had been at rehab but out recently   Expected Discharge Date:                  Expected Discharge Plan:     In-House Referral:  Clinical Social Work  Discharge planning Services  CM Consult  Post Acute Care Choice:    Choice offered to:     DME Arranged:    DME Agency:     HH Arranged:    HH Agency:     Status of Service:  In process, will continue to follow  If discussed at Long Length of Stay Meetings, dates discussed:    Additional Comments:spoke w mother Troy Brennan [phone] she lives out of town. Pt does not have ins. Mother thinks that he recently applied for soc sec disability and medicaid due to problems w vision. Fin co at [facility] will work on this. Explained role of cm and that cm or sw will assist as pt progresses.   Hanley Hays, RN 12/30/2016, 1:14 PM

## 2016-12-30 NOTE — Progress Notes (Signed)
Rn called Elink to notify of fever of 38.8 and to ask for a one time dose to help fever. Will continue to monitor.

## 2016-12-30 NOTE — Progress Notes (Signed)
Elink MD notified of pt temps remaining above 38.0.  No new orders given at this time.  RN will continue to monitor patient closely.

## 2016-12-30 NOTE — Progress Notes (Signed)
Hypoglycemic Event  CBG: 65  Treatment: D50 IV 50 mL  Symptoms: None  Follow-up CBG: Time: 0048 CBG Result:114  Possible Reasons for Event: Inadequate meal intake NPO for procedure today      Jeannie Fend

## 2016-12-30 NOTE — Progress Notes (Signed)
Pharmacy Antibiotic Note  Troy Brennan is a 46 y.o. male continues on vancomycin for MRSA pneumonia. Vancomycin trough drawn today is therapeutic at 18 (goal 15-20). Renal function stable.  Plan: 1) Continue vancomycin 750mg  IV q8  Height: 6\' 1"  (185.4 cm) Weight: 177 lb 7.5 oz (80.5 kg) IBW/kg (Calculated) : 79.9  Temp (24hrs), Avg:100.1 F (37.8 C), Min:98.6 F (37 C), Max:101.3 F (38.5 C)   Recent Labs Lab 12/26/16 0453 12/27/16 0500 12/28/16 0345 12/29/16 0245 12/30/16 0235 12/30/16 1125  WBC 24.8* 21.8* 18.9* 13.6* 12.7*  --   CREATININE 1.36* 1.23 1.03 0.88 0.88  --   VANCOTROUGH  --   --   --   --   --  18    Estimated Creatinine Clearance: 119.8 mL/min (by C-G formula based on SCr of 0.88 mg/dL).    No Known Allergies  Antimicrobials this admission: Tamiflu 2/14 >> 2/19 Vancomycin 2/14>>2/16, resumed 2/21 >> CTX 2/14>>2/15 Ceftaz 2/15>>2/22 Zosyn 2/14 x1  Dose adjustments this admission: 2/23 VT = 18 on 750mg  IV q8 (drawn ~ 30 minutes early, actual trough closer to 16)  Microbiology results:  2/14- influB 2/14 MRSA PCR- neg 2/14 resp- normal flora 2/14 urine-ng 2/14 blood x2-neg 2/20 TA MRSA  Thank you for allowing pharmacy to be a part of this patient's care.  Fredrik Rigger 12/30/2016 1:12 PM

## 2016-12-30 NOTE — Progress Notes (Signed)
Subjective: Hypoglycemic this morning. Platelets steadily improving. Tolerated trach procedure well. He is following commands this morning- will lift arms up, attempts to lift legs up, sticks out tongue, turns head to side.   Objective: Current vital signs: BP (!) 141/99   Pulse 82   Temp 99.3 F (37.4 C) (Core (Comment))   Resp 16   Ht 6\' 1"  (1.854 m)   Wt 177 lb 7.5 oz (80.5 kg)   SpO2 94%   BMI 23.41 kg/m  Vital signs in last 24 hours: Temp:  [98.6 F (37 C)-101.1 F (38.4 C)] 99.3 F (37.4 C) (02/23 0800) Pulse Rate:  [68-88] 82 (02/23 0800) Resp:  [16-24] 16 (02/23 0800) BP: (109-141)/(73-99) 141/99 (02/23 0800) SpO2:  [93 %-100 %] 94 % (02/23 0800) FiO2 (%):  [40 %] 40 % (02/23 0750) Weight:  [177 lb 7.5 oz (80.5 kg)] 177 lb 7.5 oz (80.5 kg) (02/23 0432)  Intake/Output from previous day: 02/22 0701 - 02/23 0700 In: 2296.3 [I.V.:1231.3; Blood:425; NG/GT:150; IV Piggyback:450] Out: 5950 [Urine:5800; Emesis/NG output:100; Stool:50] Intake/Output this shift: Total I/O In: 23.1 [I.V.:23.1] Out: 350 [Urine:350] Nutritional status: Diet NPO time specified  ROS:                                                                                                                                       History obtained from unobtainable from patient due to mental status  Neurologic Exam: General: NAD Mental Status: Awake on vent. Following some commands.  Cranial Nerves: Right pupil sluggish, but reactive Left corneal scaring from previous injury EOMI Shoulder shrug intact Sticks out tongue Lifts both arms up Attempts to lift bilateral legs, but weak  Motor: Tone diminished throughout Moves extremities spontaneously and to command Sensory: Nodded he could feel light touch on both sides of face  Deep Tendon Reflexes: 3+ in lower extremities (knees). 2+ in upper extremities.  Plantars: No response  Cerebellar: Unable to assess Gait: Unable to assess   Lab  Results: Basic Metabolic Panel:  Recent Labs Lab 12/23/16 1027 12/23/16 1830  12/24/16 0415 12/25/16 0404 12/26/16 0453 12/27/16 0500 12/28/16 0345 12/29/16 0245 12/30/16 0235  NA  --   --   < > 141 144 152* 152* 150* 148* 147*  K  --   --   < > 4.0 3.5 3.7 3.2* 3.8 3.9 4.3  CL  --   --   < > 102 104 107 109 110 115* 110  CO2  --   --   < > 28 32 36* 34* 32 25 28  GLUCOSE  --   --   < > 167* 159* 126* 127* 91 86 80  BUN  --   --   < > 55* 58* 65* 60* 39* 34* 37*  CREATININE  --   --   < > 1.68* 1.27* 1.36* 1.23 1.03 0.88 0.88  CALCIUM  --   --   < >  7.6* 8.0* 8.0* 8.0* 7.9* 7.8* 8.4*  MG 1.7 1.8  --  2.0 2.5* 2.6*  --   --   --   --   PHOS 3.1 4.2  --  2.3* 2.0*  --   --   --   --   --   < > = values in this interval not displayed.  Liver Function Tests:  Recent Labs Lab 12/24/16 0415 12/25/16 0404 12/28/16 0345  AST 468* 242* 55*  ALT 723* 528* 161*  ALKPHOS 48 59 56  BILITOT 0.9 0.8 0.4  PROT 5.3* 5.6* 5.6*  ALBUMIN 2.2* 2.2* 2.1*    CBC:  Recent Labs Lab 12/24/16 0415  12/26/16 0453 12/27/16 0500 12/28/16 0345 12/29/16 0245 12/30/16 0235  WBC 10.2  < > 24.8* 21.8* 18.9* 13.6* 12.7*  NEUTROABS 8.9*  --   --   --   --   --   --   HGB 12.4*  < > 12.4* 11.9* 11.9* 11.9* 12.9*  HCT 37.0*  < > 39.2 38.2* 38.3* 38.5* 40.0  MCV 84.5  < > 87.5 88.4 89.7 89.7 88.5  PLT 32*  < > 43* 53* 66* 75* 124*  < > = values in this interval not displayed.   Lipid Panel:  Recent Labs Lab 12/24/16 0036 12/27/16 0500 12/30/16 0235  TRIG 201* 369* 699*    CBG:  Recent Labs Lab 12/30/16 0017 12/30/16 0048 12/30/16 0412 12/30/16 0523 12/30/16 0749  GLUCAP 65 114* 77 103* 86    Microbiology: Results for orders placed or performed during the hospital encounter of 12/21/16  Culture, blood (Routine X 2) w Reflex to ID Panel     Status: None   Collection Time: 12/21/16  6:10 AM  Result Value Ref Range Status   Specimen Description BLOOD LEFT WRIST  Final    Special Requests BOTTLES DRAWN AEROBIC ONLY  5CC  Final   Culture NO GROWTH 5 DAYS  Final   Report Status 12/26/2016 FINAL  Final  Culture, blood (Routine X 2) w Reflex to ID Panel     Status: None   Collection Time: 12/21/16  6:18 AM  Result Value Ref Range Status   Specimen Description BLOOD LEFT HAND  Final   Special Requests IN PEDIATRIC BOTTLE  3CC  Final   Culture NO GROWTH 5 DAYS  Final   Report Status 12/26/2016 FINAL  Final  Urine culture     Status: None   Collection Time: 12/21/16  6:46 AM  Result Value Ref Range Status   Specimen Description URINE, CATHETERIZED  Final   Special Requests NONE  Final   Culture NO GROWTH  Final   Report Status 12/22/2016 FINAL  Final  Culture, respiratory (NON-Expectorated)     Status: None   Collection Time: 12/21/16 10:12 AM  Result Value Ref Range Status   Specimen Description TRACHEAL ASPIRATE  Final   Special Requests NONE  Final   Gram Stain   Final    FEW WBC PRESENT, PREDOMINANTLY PMN ABUNDANT GRAM POSITIVE COCCI ABUNDANT GRAM NEGATIVE RODS FEW YEAST    Culture Consistent with normal respiratory flora.  Final   Report Status 12/23/2016 FINAL  Final  MRSA PCR Screening     Status: None   Collection Time: 12/21/16 10:14 AM  Result Value Ref Range Status   MRSA by PCR NEGATIVE NEGATIVE Final    Comment:        The GeneXpert MRSA Assay (FDA approved for NASAL specimens only), is  one component of a comprehensive MRSA colonization surveillance program. It is not intended to diagnose MRSA infection nor to guide or monitor treatment for MRSA infections.   Respiratory Panel by PCR     Status: Abnormal   Collection Time: 12/21/16 11:35 AM  Result Value Ref Range Status   Adenovirus NOT DETECTED NOT DETECTED Final   Coronavirus 229E NOT DETECTED NOT DETECTED Final   Coronavirus HKU1 NOT DETECTED NOT DETECTED Final   Coronavirus NL63 NOT DETECTED NOT DETECTED Final   Coronavirus OC43 NOT DETECTED NOT DETECTED Final    Metapneumovirus NOT DETECTED NOT DETECTED Final   Rhinovirus / Enterovirus NOT DETECTED NOT DETECTED Final   Influenza A NOT DETECTED NOT DETECTED Final   Influenza B DETECTED (A) NOT DETECTED Final   Parainfluenza Virus 1 NOT DETECTED NOT DETECTED Final   Parainfluenza Virus 2 NOT DETECTED NOT DETECTED Final   Parainfluenza Virus 3 NOT DETECTED NOT DETECTED Final   Parainfluenza Virus 4 NOT DETECTED NOT DETECTED Final   Respiratory Syncytial Virus NOT DETECTED NOT DETECTED Final   Bordetella pertussis NOT DETECTED NOT DETECTED Final   Chlamydophila pneumoniae NOT DETECTED NOT DETECTED Final   Mycoplasma pneumoniae NOT DETECTED NOT DETECTED Final  Culture, respiratory (NON-Expectorated)     Status: None   Collection Time: 12/27/16  7:50 AM  Result Value Ref Range Status   Specimen Description TRACHEAL ASPIRATE  Final   Special Requests NONE  Final   Gram Stain   Final    FEW WBC PRESENT, PREDOMINANTLY PMN RARE SQUAMOUS EPITHELIAL CELLS PRESENT FEW GRAM POSITIVE COCCI IN PAIRS    Culture FEW METHICILLIN RESISTANT STAPHYLOCOCCUS AUREUS  Final   Report Status 12/29/2016 FINAL  Final   Organism ID, Bacteria METHICILLIN RESISTANT STAPHYLOCOCCUS AUREUS  Final      Susceptibility   Methicillin resistant staphylococcus aureus - MIC*    CIPROFLOXACIN <=0.5 SENSITIVE Sensitive     ERYTHROMYCIN <=0.25 SENSITIVE Sensitive     GENTAMICIN <=0.5 SENSITIVE Sensitive     OXACILLIN >=4 RESISTANT Resistant     TETRACYCLINE <=1 SENSITIVE Sensitive     VANCOMYCIN 1 SENSITIVE Sensitive     TRIMETH/SULFA <=10 SENSITIVE Sensitive     CLINDAMYCIN <=0.25 SENSITIVE Sensitive     RIFAMPIN <=0.5 SENSITIVE Sensitive     Inducible Clindamycin NEGATIVE Sensitive     * FEW METHICILLIN RESISTANT STAPHYLOCOCCUS AUREUS    Coagulation Studies:  Recent Labs  12/29/16 0535  LABPROT 14.7  INR 1.15    Imaging: Dg Chest Port 1 View  Result Date: 12/30/2016 CLINICAL DATA:  Pneumonia. EXAM: PORTABLE CHEST 1  VIEW COMPARISON:  12/29/2016. FINDINGS: Tracheostomy to and feeding tube in stable position. Heart size normal. Diffuse multifocal pulmonary infiltrates/edema are again noted. No change. No pleural effusion or pneumothorax. IMPRESSION: 1. Lines and tubes in stable position. 2. Diffuse multifocal pulmonary infiltrates/edema are again noted. No change. Electronically Signed   By: Maisie Fus  Register   On: 12/30/2016 06:16   Dg Chest Port 1 View  Result Date: 12/29/2016 CLINICAL DATA:  Stroke and trach placement. Pt was on a cooling mat that could not be removed for the x-ray EXAM: PORTABLE CHEST - 1 VIEW COMPARISON:  Earlier film of the same day FINDINGS: Tracheostomy device has been placed in the interval, projecting in expected location. Feeding tube extends at least as far stomach, tip not seen. Patchy bibasilar sit airspace opacities right greater than left slightly improved. Perihilar vascular congestion and opacities also slightly improved. Heart size and mediastinal  contours are within normal limits. No effusion.  No pneumothorax. Visualized bones unremarkable. IMPRESSION: 1. Tracheostomy placement without apparent complication. 2. Slight improvement in bilateral infiltrates or edema. Electronically Signed   By: Corlis Leak M.D.   On: 12/29/2016 15:11   Dg Chest Port 1 View  Result Date: 12/29/2016 CLINICAL DATA:  Cardiac arrest. EXAM: PORTABLE CHEST 1 VIEW COMPARISON:  One-view chest x-ray 12/28/2016 FINDINGS: Endotracheal tube is in satisfactory position 4 cm above the carina. Small bore feeding tube courses off the inferior border of the film. The heart size normal. The patient is slightly rotated. Increasing interstitial and airspace disease is present bilaterally, right greater left. Detail is somewhat obscured by a grid pattern on the right, external to the patient. IMPRESSION: 1. Increasing interstitial and airspace disease concerning for edema or infection, right greater than left. 2. Support  apparatus is stable. Electronically Signed   By: Marin Roberts M.D.   On: 12/29/2016 07:15   Dg Chest Port 1 View  Result Date: 12/28/2016 CLINICAL DATA:  Hypoxia EXAM: PORTABLE CHEST 1 VIEW COMPARISON:  December 27, 2016 FINDINGS: Endotracheal tube tip is 4.7 cm above the carina. Central catheter tip is in the superior vena cava. Feeding tube tip is below the diaphragm. No pneumothorax. There is subtle patchy opacity in the right mid lung. Lungs elsewhere clear. Heart size and pulmonary vascularity are normal. No adenopathy. No bone lesions. IMPRESSION: Tube and catheter positions as described without evident pneumothorax. Subtle infiltrate right mid lung, likely early pneumonia. Lungs elsewhere clear. Cardiac silhouette within normal limits. Electronically Signed   By: Bretta Bang III M.D.   On: 12/28/2016 10:43    Medications: I have reviewed the patient's current medications.  Assessment/Plan:  1. Anoxic brain Injury s/p cardiac arrest: 2. Acute encephalopathy s/p cardiac arrest 3. Acute hypoxic respiratory failure 2/2 influenza B with concurrent CAP vs aspiration PNA 4. Systolic heart failure s/p cardiac arrest, EF 15-20% 5. Hep C positive antibody 6. Thrombocytopenia 7. Polysubstance and alcohol abuse 8. Protein calorie malnutrition  He is following commands consistently this morning. Markedly improving. Limit sedation as able. Will continue to follow his exam. Prognosis is looking favorable for reasonable recovery.   Attending note to follow  Rich Number, MD, MPH Internal Medicine Resident, PGY-III Pager: [phone] 12/30/2016, 9:43 AM

## 2016-12-30 NOTE — Progress Notes (Signed)
PULMONARY / CRITICAL CARE MEDICINE   Name: Troy Brennan MRN: [id number] DOB: [birth year]    ADMISSION DATE:  12/21/2016 CONSULTATION DATE:  12/21/2016  REFERRING MD:  Dr. Blinda Leatherwood   CHIEF COMPLAINT: Cardiac Arrest  HISTORY OF PRESENT ILLNESS:  46 year old male with ETOH and polysubstance abuse, recently discharged from detox facility 8-10 days ago. Found in cardiac arrest, septic, normothermia, ards.  SUBJECTIVE/interval: trach  Follows commands  VITAL SIGNS: BP 119/84   Pulse 93   Temp (!) 101.3 F (38.5 C)   Resp (!) 24   Ht 6\' 1"  (1.854 m)   Wt 80.5 kg (177 lb 7.5 oz)   SpO2 95%   BMI 23.41 kg/m   HEMODYNAMICS:    VENTILATOR SETTINGS: Vent Mode: CPAP;PSV FiO2 (%):  [40 %] 40 % Set Rate:  [24 bmp] 24 bmp Vt Set:  [550 mL] 550 mL PEEP:  [5 cmH20] 5 cmH20 Pressure Support:  [10 cmH20] 10 cmH20 Plateau Pressure:  [5 cmH20-18 cmH20] 18 cmH20  INTAKE / OUTPUT:  Intake/Output Summary (Last 24 hours) at 12/30/16 1304 Last data filed at 12/30/16 1200  Gross per 24 hour  Intake          1921.63 ml  Output             7800 ml  Net         -5878.37 ml    General appearance:  46 Year old  Male acute on chronically ill appearing. Awake, trach Eyes: right eye w/ injected hemorrhagic sclera, left eye is clouded chronic Neck: trach cdry intact Lungs/chest: ronchi CV: RRR, no MRGs  Abdomen: Soft, non-tender; no masses or HSM Extremities:edema  Skin:diffuse petechial  Rash, gangrenous changes on right hand unchanged.  Neuro/Psych: rass -2, fc, fent   LABS:  BMET  Recent Labs Lab 12/28/16 0345 12/29/16 0245 12/30/16 0235  NA 150* 148* 147*  K 3.8 3.9 4.3  CL 110 115* 110  CO2 32 25 28  BUN 39* 34* 37*  CREATININE 1.03 0.88 0.88  GLUCOSE 91 86 80    Electrolytes  Recent Labs Lab 12/23/16 1830  12/24/16 0415 12/25/16 0404 12/26/16 0453  12/28/16 0345 12/29/16 0245 12/30/16 0235  CALCIUM  --   < > 7.6* 8.0* 8.0*  < > 7.9* 7.8* 8.4*  MG 1.8   --  2.0 2.5* 2.6*  --   --   --   --   PHOS 4.2  --  2.3* 2.0*  --   --   --   --   --   < > = values in this interval not displayed.  CBC  Recent Labs Lab 12/28/16 0345 12/29/16 0245 12/30/16 0235  WBC 18.9* 13.6* 12.7*  HGB 11.9* 11.9* 12.9*  HCT 38.3* 38.5* 40.0  PLT 66* 75* 124*    Coag's  Recent Labs Lab 12/25/16 0404 12/29/16 0535  INR 1.14 1.15    Sepsis Markers No results for input(s): LATICACIDVEN, PROCALCITON, O2SATVEN in the last 168 hours.  ABG  Recent Labs Lab 12/24/16 2200 12/25/16 0330 12/28/16 1230  PHART 7.425 7.465* 7.403  PCO2ART 51.9* 48.3* 49.3*  PO2ART 75.5* 73.0* 97.6    Liver Enzymes  Recent Labs Lab 12/24/16 0415 12/25/16 0404 12/28/16 0345  AST 468* 242* 55*  ALT 723* 528* 161*  ALKPHOS 48 59 56  BILITOT 0.9 0.8 0.4  ALBUMIN 2.2* 2.2* 2.1*    Cardiac Enzymes No results for input(s): TROPONINI, PROBNP in the last 168 hours.  Glucose  Recent Labs Lab 12/30/16 0017 12/30/16 0048 12/30/16 0412 12/30/16 0523 12/30/16 0749 12/30/16 1147  GLUCAP 65 114* 77 103* 86 191*    Imaging Dg Chest Port 1 View  Result Date: 12/30/2016 CLINICAL DATA:  Pneumonia. EXAM: PORTABLE CHEST 1 VIEW COMPARISON:  12/29/2016. FINDINGS: Tracheostomy to and feeding tube in stable position. Heart size normal. Diffuse multifocal pulmonary infiltrates/edema are again noted. No change. No pleural effusion or pneumothorax. IMPRESSION: 1. Lines and tubes in stable position. 2. Diffuse multifocal pulmonary infiltrates/edema are again noted. No change. Electronically Signed   By: Maisie Fus  Register   On: 12/30/2016 06:16   Dg Chest Port 1 View  Result Date: 12/29/2016 CLINICAL DATA:  Stroke and trach placement. Pt was on a cooling mat that could not be removed for the x-ray EXAM: PORTABLE CHEST - 1 VIEW COMPARISON:  Earlier film of the same day FINDINGS: Tracheostomy device has been placed in the interval, projecting in expected location. Feeding tube  extends at least as far stomach, tip not seen. Patchy bibasilar sit airspace opacities right greater than left slightly improved. Perihilar vascular congestion and opacities also slightly improved. Heart size and mediastinal contours are within normal limits. No effusion.  No pneumothorax. Visualized bones unremarkable. IMPRESSION: 1. Tracheostomy placement without apparent complication. 2. Slight improvement in bilateral infiltrates or edema. Electronically Signed   By: Corlis Leak M.D.   On: 12/29/2016 15:11  PCXR: aeration a little worse R>L.   STUDIES:  CXR 2/14 > Diffuse bilateral airspace infiltrates, ? Edema, PNA, or Hemorrhagic Consolidation   CT Head 2/14 > neg 2/15 echo>>>15-20 diffuse low EF Hepatitis panel >>  MRI brain: 1. Bilateral acute to early subacute ischemia within the basal ganglia. This pattern is most consistent with hypoxic ischemic injury, particularly in the context of recent cardiac arrest. 2. Suspected petechial hemorrhage within both basal ganglia. No space-occupying hematoma. EEG 2/20: generalized slowing. Non-specific findings.   CULTURES: Blood 2/14 >neg Sputum 2/14 > normal flora Urine 2/14 > negative RVP 2/14 >POS flu B Sputum 2/20: SA>>>  ANTIBIOTICS: Vancomycin 2/14 >>>2/16; Vanc 2/21>>> Rocephin 2/14 >>> 2/15 ceftaz 2/15 >>  Tamiflu 2/14 >>> 2/19  SIGNIFICANT EVENTS: 2/14 > Presents to ED after being found pulseless/apneic at home 2/15 prone 2/17 supine, normothermia ongoing, PEEP / FiO2 better 2/19 . Normoothermia protocol completed. attempted to transition to precedex. Did not tolerated d/t increased RR and tachycardia 2/20 still not following commands. Get MRI brain, getting EEG. Starting seroquel I low dose clonazepam.  2/21 failed PSV attempt w/ diprivan and fent cut in half d/t increased RR and cough. Family decided on trach. Did make DNR, started Vanc for SA PNA 2/22 platelets for trach, trach at 1400  LINES/TUBES: ETT 2/14 >>  2/22 Trach (df) 2/22>>> 2/14 left IJ>>>out 2 /14 a line rt rad>>>out   Impression/plan Resolved problems Septic shock Cardiac arrest Influenza B Aspiration PNA vs CAP (NOS) AKI Shock liver   Current problems  NEURO Anoxic Encephalopathy  Bilateral Basal ganglial stroke  Plan Cont Clonazepam & seroquel, may need some escalation to dc fent/ prop all together May need methadonre added top dc drip Add fent 100 scheduled for now and fent patch PT sooon  PULMONARY Acute hypoxic respiratory failure  HCAP (growing SA from sputum on 2/20) Failure to wean  Plan trachj done. Attempted PS 10/5, then 30 min failed Re attempt PS 10 tid again in am  Rate on rest down Neg 3.8 liters, maintain neg as tolerates  CARDIOVASCULAR Acute systolic CM s/p Cardiac arrest EF 15-20% Plan Cont tele  Cont BB  Lasix Asa in future  DERM Dry gangrene changes of right hand Diffuse petechial rash Stable  Plan Observe   RENAL Hypernatremia  Anasarca Plan Cont D5W and free water at current Lasix keep with d5w F/u chem  Am  HEMATOLOGY  Anemia of critical illness Sepsis related thrombocytopenia resolving Plan Cont PAS F/u am cbc if plat again over 100, add asa  GI Protein calorie malnutrition  Diarrhea Plan Cont tube feeds  INFECTIOUS DISEASE HCAP (growing SA in sputum from 2/20) -vanc started 2/21 Plan Vanc, consider 8-10 days   Ccm time 35 min  Mcarthur Rossetti. Tyson Alias, MD, FACP Pgr: [phone] [facility] 12/30/2016 1:04 PM

## 2016-12-31 ENCOUNTER — Inpatient Hospital Stay (HOSPITAL_COMMUNITY): Payer: Self-pay

## 2016-12-31 LAB — GLUCOSE, CAPILLARY
Glucose-Capillary: 101 mg/dL — ABNORMAL HIGH (ref 65–99)
Glucose-Capillary: 116 mg/dL — ABNORMAL HIGH (ref 65–99)
Glucose-Capillary: 171 mg/dL — ABNORMAL HIGH (ref 65–99)
Glucose-Capillary: 101 mg/dL — ABNORMAL HIGH (ref 65–99)

## 2016-12-31 LAB — CBC WITH DIFFERENTIAL/PLATELET
BASOS PCT: 0 %
Basophils Absolute: 0 10*3/uL (ref 0.0–0.1)
EOS ABS: 0.1 10*3/uL (ref 0.0–0.7)
EOS PCT: 1 %
HCT: 41 % (ref 39.0–52.0)
HEMOGLOBIN: 13.3 g/dL (ref 13.0–17.0)
LYMPHS ABS: 1.5 10*3/uL (ref 0.7–4.0)
Lymphocytes Relative: 12 %
MCH: 28.2 pg (ref 26.0–34.0)
MCHC: 32.4 g/dL (ref 30.0–36.0)
MCV: 87 fL (ref 78.0–100.0)
Monocytes Absolute: 0.8 10*3/uL (ref 0.1–1.0)
Monocytes Relative: 6 %
NEUTROS PCT: 81 %
Neutro Abs: 10.3 10*3/uL — ABNORMAL HIGH (ref 1.7–7.7)
PLATELETS: 163 10*3/uL (ref 150–400)
RBC: 4.71 MIL/uL (ref 4.22–5.81)
RDW: 13.7 % (ref 11.5–15.5)
WBC: 12.7 10*3/uL — AB (ref 4.0–10.5)

## 2016-12-31 LAB — BASIC METABOLIC PANEL
Anion gap: 9 (ref 5–15)
BUN: 40 mg/dL — AB (ref 6–20)
CALCIUM: 8.6 mg/dL — AB (ref 8.9–10.3)
CHLORIDE: 104 mmol/L (ref 101–111)
CO2: 27 mmol/L (ref 22–32)
CREATININE: 0.98 mg/dL (ref 0.61–1.24)
GFR calc non Af Amer: >60 mL/min (ref >60)
Glucose, Bld: 138 mg/dL — ABNORMAL HIGH (ref 65–99)
Potassium: 4 mmol/L (ref 3.5–5.1)
SODIUM: 140 mmol/L (ref 135–145)

## 2016-12-31 LAB — PHOSPHORUS: PHOSPHORUS: 3.6 mg/dL (ref 2.5–4.6)

## 2016-12-31 LAB — MAGNESIUM: MAGNESIUM: 1.6 mg/dL — AB (ref 1.7–2.4)

## 2016-12-31 MED ORDER — ASPIRIN 81 MG PO CHEW
81.0000 mg | CHEWABLE_TABLET | Freq: Every day | ORAL | Status: DC
  Administered 2017-01-02 – 2017-01-06 (×5): 81 mg
  Filled 2016-12-31 (×7): qty 1

## 2016-12-31 MED ORDER — MAGNESIUM SULFATE 4 GM/100ML IV SOLN
4.0000 g | Freq: Once | INTRAVENOUS | Status: AC
  Administered 2016-12-31: 4 g via INTRAVENOUS
  Filled 2016-12-31: qty 100

## 2016-12-31 MED ORDER — FENTANYL CITRATE (PF) 100 MCG/2ML IJ SOLN
25.0000 ug | INTRAMUSCULAR | Status: DC | PRN
  Administered 2017-01-01: 100 ug via INTRAVENOUS
  Administered 2017-01-01: 50 ug via INTRAVENOUS
  Administered 2017-01-02 – 2017-01-04 (×12): 100 ug via INTRAVENOUS
  Filled 2016-12-31 (×14): qty 2

## 2016-12-31 MED ORDER — PNEUMOCOCCAL VAC POLYVALENT 25 MCG/0.5ML IJ INJ
0.5000 mL | INJECTION | INTRAMUSCULAR | Status: DC | PRN
  Filled 2016-12-31: qty 0.5

## 2016-12-31 MED ORDER — INFLUENZA VAC SPLIT QUAD 0.5 ML IM SUSY: 0.5000 mL | PREFILLED_SYRINGE | INTRAMUSCULAR | Status: DC | PRN

## 2016-12-31 MED ORDER — FENTANYL CITRATE (PF) 100 MCG/2ML IJ SOLN
INTRAMUSCULAR | Status: AC
  Filled 2016-12-31: qty 2

## 2016-12-31 NOTE — Progress Notes (Signed)
Neurology Progress Note  Subjective: He has been febrile since last night. He continues to follow simple commands. He remains on the ventilator per trach, now off sedative drips. He does not attempt to answer questions for me and is not able to participate with ROS.   Current Meds:   Current Facility-Administered Medications:  .  [START ON 01/01/2017] aspirin chewable tablet 81 mg, 81 mg, Per Tube, Daily, Donita Brooks, NP .  chlorhexidine gluconate (MEDLINE KIT) (PERIDEX) 0.12 % solution 15 mL, 15 mL, Mouth Rinse, BID, Raylene Miyamoto, MD, 15 mL at 12/31/16 0800 .  clonazePAM (KLONOPIN) tablet 2 mg, 2 mg, Per Tube, BID, Erick Colace, NP, 2 mg at 12/31/16 1022 .  dextrose 5 % solution, , Intravenous, Continuous, Erick Colace, NP, Stopped at 12/28/16 1000 .  feeding supplement (PRO-STAT SUGAR FREE 64) liquid 60 mL, 60 mL, Per Tube, TID, Raylene Miyamoto, MD, 60 mL at 12/31/16 1022 .  feeding supplement (VITAL AF 1.2 CAL) liquid 1,000 mL, 1,000 mL, Per Tube, Continuous, Raylene Miyamoto, MD, Last Rate: 25 mL/hr at 12/31/16 0800, 1,000 mL at 12/31/16 0800 .  fentaNYL (DURAGESIC - dosed mcg/hr) patch 75 mcg, 75 mcg, Transdermal, Q72H, Raylene Miyamoto, MD, 75 mcg at 12/30/16 1415 .  fentaNYL (SUBLIMAZE) injection 100 mcg, 100 mcg, Intravenous, Q4H, Raylene Miyamoto, MD, 100 mcg at 12/31/16 0846 .  fentaNYL (SUBLIMAZE) injection 25-100 mcg, 25-100 mcg, Intravenous, Q2H PRN, Donita Brooks, NP .  free water 200 mL, 200 mL, Per Tube, Q4H, Erick Colace, NP, 200 mL at 12/31/16 1100 .  furosemide (LASIX) injection 40 mg, 40 mg, Intravenous, BID, Raylene Miyamoto, MD, 40 mg at 12/31/16 0846 .  ibuprofen (ADVIL,MOTRIN) 100 MG/5ML suspension 400 mg, 400 mg, Per Tube, Q6H PRN, Raylene Miyamoto, MD, 400 mg at 12/31/16 1147 .  Influenza vac split quadrivalent PF (FLUARIX) injection 0.5 mL, 0.5 mL, Intramuscular, Prior to discharge, Raylene Miyamoto, MD .  MEDLINE mouth rinse, 15 mL,  Mouth Rinse, 10 times per day, Raylene Miyamoto, MD, 15 mL at 12/31/16 1154 .  metoprolol tartrate (LOPRESSOR) 25 mg/10 mL oral suspension 50 mg, 50 mg, Per Tube, BID, Erick Colace, NP, 50 mg at 12/31/16 1022 .  pantoprazole sodium (PROTONIX) 40 mg/20 mL oral suspension 40 mg, 40 mg, Per Tube, QHS, Raylene Miyamoto, MD, 40 mg at 12/30/16 2117 .  pneumococcal 23 valent vaccine (PNU-IMMUNE) injection 0.5 mL, 0.5 mL, Intramuscular, Prior to discharge, Raylene Miyamoto, MD .  QUEtiapine (SEROQUEL) tablet 200 mg, 200 mg, Per Tube, TID, Raylene Miyamoto, MD, 200 mg at 12/31/16 1022 .  vancomycin (VANCOCIN) IVPB 750 mg/150 ml premix, 750 mg, Intravenous, Q8H, Melburn Popper, RPH, 750 mg at 12/31/16 1147  Objective:  Temp:  [100.2 F (37.9 C)-101.8 F (38.8 C)] 101.1 F (38.4 C) (02/24 1200) Pulse Rate:  [81-104] 98 (02/24 1200) Resp:  [18-27] 22 (02/24 1200) BP: (111-148)/(70-98) 127/98 (02/24 1200) SpO2:  [96 %-100 %] 100 % (02/24 1200) FiO2 (%):  [40 %] 40 % (02/24 1145) Weight:  [77.7 kg (171 lb 4.8 oz)] 77.7 kg (171 lb 4.8 oz) (02/24 0100)  General: WDWN lying in bed in NAD. He will turn towards me when I call his name but did not clearly fix on me. He was able to follow simple midline and appendicular commands--stick out tongue, hold up R arm then L arm, wiggle toes. He moves restlessly all the time.  HEENT: Neck is supple without lymphadenopathy. Trach with ETT in place. Mucous membranes are moist. There is corneal scarring on the L. There is mild conjunctival injection on the R.  CV: Regular, no murmur. Carotid pulses are 2+ and symmetric with no bruits. Distal pulses 2+ and symmetric.  Lungs: Coarse upper airway sounds bilaterally.  Extremities: No edema. The fingertips of the R hand are black and necrotic.  Neuro: MS: As noted above. No aphasia.  CN: The R pupil is reactive from 3-->2 mm. The L pupil cannot be assessed due to overlying corneal scarring in that eye. Eyes are  conjugate. EOMI appear to be intact, no nystagmus. Corneals are present bilaterally. Face is symmetric at rest with normal mobility. Tongue protrudes to midline. The remainder of his cranial nerves cannot be accurately assessed as he is unable to participate with the exam.  Motor: Normal bulk, tone. He moves all four limbs with at least 4-/5 strength. No tremor or other abnormal movements are observed.  Sensation: he withdraws from light noxious stimulation x4.   DTRs: 3+, symmetric. Toes are mute bilaterally. No pathological reflexes.  Coordination and gait: These cannot be assessed as he is unable to participate with the exam.   Labs: Lab Results  Component Value Date   WBC 12.7 (H) 12/31/2016   HGB 13.3 12/31/2016   HCT 41.0 12/31/2016   PLT 163 12/31/2016   GLUCOSE 138 (H) 12/31/2016   TRIG 699 (H) 12/30/2016   ALT 161 (H) 12/28/2016   AST 55 (H) 12/28/2016   NA 140 12/31/2016   K 4.0 12/31/2016   CL 104 12/31/2016   CREATININE 0.98 12/31/2016   BUN 40 (H) 12/31/2016   CO2 27 12/31/2016   INR 1.15 12/29/2016   CBC Latest Ref Rng & Units 12/31/2016 12/30/2016 12/29/2016  WBC 4.0 - 10.5 K/uL 12.7(H) 12.7(H) 13.6(H)  Hemoglobin 13.0 - 17.0 g/dL 13.3 12.9(L) 11.9(L)  Hematocrit 39.0 - 52.0 % 41.0 40.0 38.5(L)  Platelets 150 - 400 K/uL 163 124(L) 75(L)    No results found for: HGBA1C Lab Results  Component Value Date   ALT 161 (H) 12/28/2016   AST 55 (H) 12/28/2016   ALKPHOS 56 12/28/2016   BILITOT 0.4 12/28/2016    Radiology:  No new neuroimaging.   A/P:   1. Anoxic brain injury: This is severe, due to cardiac arrest and prolonged hypoxic respiratory failure in the setting of ARDS due to influenza pneumonitis and superimposed bacterial PNA. Treatment is supportive as below.   2. Anoxic encephalopathy: This is acute, due to anoxic brain injury from cardiac arrest and ARDS. He is doing quite well neurologically, able to follow commands consistently. Continue with supportive  care . Avoid hypoxemia and hypotension for even brief intervals as these are associated with worse neurologic outcomes. Fever and hyperglycemia must be aggressively treated for the same reason. We will continue to follow the exam to aid with neurologic prognosis.   This was discussed with the patient's family at the bedside. They were given the chance to ask any questions and these were addressed to their satisfaction.   This patient is critically ill and at significant risk of neurological worsening, death and care requires constant monitoring of vital signs, hemodynamics,respiratory and cardiac monitoring, neurological assessment, discussion with family, other specialists and medical decision making of high complexity. A total of 30 minutes of critical care time was spent on this case.   Melba Coon, MD [facility]

## 2016-12-31 NOTE — Progress Notes (Signed)
eLink Physician-Brief Progress Note Patient Name: Vannak Lojek DOB: [birth year] MRN: [id number]   Date of Service  12/31/2016  HPI/Events of Note  Hypomag  eICU Interventions  Mag replaced     Intervention Category Intermediate Interventions: Electrolyte abnormality - evaluation and management  Tasha Diaz 12/31/2016, 6:44 AM

## 2016-12-31 NOTE — Progress Notes (Signed)
Patient has required admission documentation due, however, the patient is unable to provide responses and no family has been present at the beside since transfer to 4E around 1645.

## 2016-12-31 NOTE — Progress Notes (Signed)
PULMONARY / CRITICAL CARE MEDICINE   Name: Troy Brennan MRN: [id number] DOB: 15-Dec-1970    ADMISSION DATE:  12/21/2016 CONSULTATION DATE:  12/21/2016  REFERRING MD:  Dr. Blinda Leatherwood   CHIEF COMPLAINT: Cardiac Arrest  BRIEF SUMMARY:  46 year old male with ETOH and polysubstance abuse, recently discharged from detox facility 8-10 days prior to admit. Found in cardiac arrest, septic, normothermia, ARDS.  SUBJECTIVE:  RN reports pt off fentanyl gtt since 0100, received 1 PRN dose fentanyl.  Remains "active" in bed.  No acute events overnight.   VITAL SIGNS: BP 130/86 (BP Location: Left Arm)   Pulse 92   Temp (!) 100.6 F (38.1 C) (Core (Comment))   Resp (!) 22   Ht 6\' 1"  (1.854 m)   Wt 171 lb 4.8 oz (77.7 kg)   SpO2 100%   BMI 22.60 kg/m   HEMODYNAMICS:    VENTILATOR SETTINGS: Vent Mode: PRVC FiO2 (%):  [40 %] 40 % Set Rate:  [24 bmp] 24 bmp Vt Set:  [550 mL] 550 mL PEEP:  [5 cmH20] 5 cmH20 Pressure Support:  [10 cmH20] 10 cmH20 Plateau Pressure:  [15 cmH20-18 cmH20] 15 cmH20  INTAKE / OUTPUT:  Intake/Output Summary (Last 24 hours) at 12/31/16 0841 Last data filed at 12/31/16 0800  Gross per 24 hour  Intake          2308.46 ml  Output             5800 ml  Net         -3491.54 ml   General:  Chronically ill appearing male in NAD HEENT: MM pink/moist, #8 trach midline c/d/i, sutures intact, L eye opaque PSY: calm Neuro: Awake, tracks, no follow commands but moving all extremities  CV: s1s2 rrr, no m/r/g PULM: even/non-labored, lungs bilaterally coarse SW:NIOE, non-tender, bsx4 active  Extremities: warm/dry, no edema  Skin: no rashes or lesions  LABS:  BMET  Recent Labs Lab 12/29/16 0245 12/30/16 0235 12/31/16 0537  NA 148* 147* 140  K 3.9 4.3 4.0  CL 115* 110 104  CO2 25 28 27   BUN 34* 37* 40*  CREATININE 0.88 0.88 0.98  GLUCOSE 86 80 138*    Electrolytes  Recent Labs Lab 12/25/16 0404 12/26/16 0453  12/29/16 0245 12/30/16 0235  12/31/16 0537  CALCIUM 8.0* 8.0*  < > 7.8* 8.4* 8.6*  MG 2.5* 2.6*  --   --   --  1.6*  PHOS 2.0*  --   --   --   --  3.6  < > = values in this interval not displayed.  CBC  Recent Labs Lab 12/29/16 0245 12/30/16 0235 12/31/16 0537  WBC 13.6* 12.7* 12.7*  HGB 11.9* 12.9* 13.3  HCT 38.5* 40.0 41.0  PLT 75* 124* 163    Coag's  Recent Labs Lab 12/25/16 0404 12/29/16 0535  INR 1.14 1.15    Sepsis Markers No results for input(s): LATICACIDVEN, PROCALCITON, O2SATVEN in the last 168 hours.  ABG  Recent Labs Lab 12/24/16 2200 12/25/16 0330 12/28/16 1230  PHART 7.425 7.465* 7.403  PCO2ART 51.9* 48.3* 49.3*  PO2ART 75.5* 73.0* 97.6    Liver Enzymes  Recent Labs Lab 12/25/16 0404 12/28/16 0345  AST 242* 55*  ALT 528* 161*  ALKPHOS 59 56  BILITOT 0.8 0.4  ALBUMIN 2.2* 2.1*    Cardiac Enzymes No results for input(s): TROPONINI, PROBNP in the last 168 hours.  Glucose  Recent Labs Lab 12/30/16 1147 12/30/16 1637 12/30/16 2010 12/30/16 2353  12/31/16 0401 12/31/16 0820  GLUCAP 191* 112* 94 105* 101* 116*    Imaging Dg Chest Port 1 View  Result Date: 12/31/2016 CLINICAL DATA:  Acute respiratory acidosis. EXAM: PORTABLE CHEST 1 VIEW COMPARISON:  12/30/2016. FINDINGS: Tracheostomy is midline. Feeding tube is followed into the stomach with the tip projecting beyond the inferior margin of the image. Heart size normal. Mild diffuse interstitial prominence and indistinctness, slightly improved. No definite pleural fluid. IMPRESSION: Mild diffuse interstitial prominence and indistinctness, slightly improved. Findings may be due to improving edema, aspiration or pneumonia. Electronically Signed   By: Leanna Battles M.D.   On: 12/31/2016 07:29    STUDIES:  CXR 2/14 >>  Diffuse bilateral airspace infiltrates, ? Edema, PNA, or Hemorrhagic Consolidation   CT Head 2/14 >> neg ECHO 2/15 >> 15-20 diffuse low EF Hepatitis panel >> negative MRI brain 2/20 >>  Bilateral acute to early subacute ischemia within the basal ganglia. This pattern is most consistent with hypoxic ischemic injury, particularly in the context of recent cardiac arrest. Suspected petechial hemorrhage within both basal ganglia. No space-occupying hematoma. EEG 2/20 >> generalized slowing. Non-specific findings.   CULTURES: Blood 2/14 >> neg Sputum 2/14 >> normal flora Urine 2/14 >> negative RVP 2/14 >> POS flu B Sputum 2/20 >> MRSA >> sens vanco  ANTIBIOTICS: Vancomycin 2/14 >> 2/16; Vanc 2/21 >> Rocephin 2/14 >> 2/15 ceftaz 2/15 >> 2/22 Tamiflu 2/14 >> 2/19 Vanco 2/21 (MRSA sputum) >>   SIGNIFICANT EVENTS: 2/14  Presents to ED after being found pulseless/apneic at home 2/15  prone 2/17  supine, normothermia ongoing, PEEP / FiO2 better 2/19  Normoothermia protocol complete. Did not tolerated precedex d/t increased RR and tachycardia 2/20  Not following commands. Get MRI brain, getting EEG. Starting seroquel I low dose clonazepam.  2/21  Failed PSV. Family decided on trach. DNR, started Vanc for SA PNA 2/22  Platelets for trach, trach at 1400 2/24  Transitioned to SDU  LINES/TUBES: ETT 2/14 >> 2/22 Trach (df) 2/22 >> 2/14 left IJ >> out 2 /14 a line rt rad >> out   Impression/plan  Resolved problems Septic shock Cardiac arrest Influenza B Aspiration PNA vs CAP (NOS) AKI Shock liver   Current problems  NEURO A: Anoxic Encephalopathy  Bilateral Basal Ganglial Stroke  P: Discontinue fentanyl gtt Continue fentanyl patch, 100 mcg Q4 + PRN Klonopin 2mg  BID  Seroquel 200 mg TID  May need methadone  PT consult Mobilize as able  PULMONARY A: Acute hypoxic respiratory failure  HCAP (MRSA) Failure to Wean from Vent P: Trac care per protocol, #8 in place Discontinue trach sutures after 2/28  PRVC 8 cc/kg  Reduce rate to 16 Daily SBT / WUA  CARDIOVASCULAR A:  Acute systolic CM s/p Cardiac arrest EF 15-20% P: Continue tele monitoring  Lasix  40 mg BID  Lopressor 50 mg BID Resume ASA in am 2/25   DERM A: Dry gangrene changes of right hand Diffuse petechial rash P: Monitor   RENAL A: Hypernatremia  Anasarca P: Reduce D5w to 50 ml/hr  Trend BMP / UOP  Replace electrolytes as indicated Free water 200 ml Lasix as above  HEMATOLOGY A:  Anemia of critical illness Sepsis related thrombocytopenia resolving P: SCD's for DVT prophylaxis  Trend CBC  Resume ASA in am 2/25 given rise in platelets  GI A:  Protein calorie malnutrition  Diarrhea P: TF per nutrition  PPI BID  INFECTIOUS A: HCAP (MRSA) P: Continue vancomycin, D4/7  Family: no family  available on am rounds   Global: transfer to SDU and [facility] for primary medicine.  PCCM will continue to follow for respiratory / vent needs.  Pending disposition > SNF vs Vent SNF  Time spent coordinating care and with patient > 35 minutes  Canary Brim, NP-C [facility] Pgr: [phone] or if no answer [phone] 12/31/2016, 8:41 AM

## 2016-12-31 NOTE — Progress Notes (Addendum)
100 cc of Fentanyl wasted in sink witnessed by Prince Rome, RN & Deretha Emory, RN.

## 2017-01-01 ENCOUNTER — Inpatient Hospital Stay (HOSPITAL_COMMUNITY): Payer: Self-pay

## 2017-01-01 DIAGNOSIS — T17928A Food in respiratory tract, part unspecified causing other injury, initial encounter: Secondary | ICD-10-CM

## 2017-01-01 DIAGNOSIS — T17920A Food in respiratory tract, part unspecified causing asphyxiation, initial encounter: Secondary | ICD-10-CM

## 2017-01-01 DIAGNOSIS — J9602 Acute respiratory failure with hypercapnia: Secondary | ICD-10-CM

## 2017-01-01 DIAGNOSIS — G931 Anoxic brain damage, not elsewhere classified: Secondary | ICD-10-CM

## 2017-01-01 LAB — GLUCOSE, CAPILLARY
GLUCOSE-CAPILLARY: 118 mg/dL — AB (ref 65–99)
GLUCOSE-CAPILLARY: 131 mg/dL — AB (ref 65–99)
GLUCOSE-CAPILLARY: 135 mg/dL — AB (ref 65–99)
Glucose-Capillary: 117 mg/dL — ABNORMAL HIGH (ref 65–99)
Glucose-Capillary: 107 mg/dL — ABNORMAL HIGH (ref 65–99)
Glucose-Capillary: 149 mg/dL — ABNORMAL HIGH (ref 65–99)

## 2017-01-01 LAB — CBC
HCT: 40.5 % (ref 39.0–52.0)
Hemoglobin: 13 g/dL (ref 13.0–17.0)
MCH: 27.9 pg (ref 26.0–34.0)
MCHC: 32.1 g/dL (ref 30.0–36.0)
MCV: 86.9 fL (ref 78.0–100.0)
PLATELETS: 180 10*3/uL (ref 150–400)
RBC: 4.66 MIL/uL (ref 4.22–5.81)
RDW: 13.5 % (ref 11.5–15.5)
WBC: 12.5 10*3/uL — ABNORMAL HIGH (ref 4.0–10.5)

## 2017-01-01 LAB — BASIC METABOLIC PANEL
ANION GAP: 10 (ref 5–15)
BUN: 44 mg/dL — AB (ref 6–20)
CALCIUM: 8.6 mg/dL — AB (ref 8.9–10.3)
CO2: 28 mmol/L (ref 22–32)
CREATININE: 1.08 mg/dL (ref 0.61–1.24)
Chloride: 100 mmol/L — ABNORMAL LOW (ref 101–111)
GFR calc Af Amer: >60 mL/min (ref >60)
GLUCOSE: 117 mg/dL — AB (ref 65–99)
Potassium: 3.6 mmol/L (ref 3.5–5.1)
Sodium: 138 mmol/L (ref 135–145)

## 2017-01-01 LAB — MAGNESIUM: Magnesium: 2.2 mg/dL (ref 1.7–2.4)

## 2017-01-01 MED ORDER — SODIUM CHLORIDE 0.9 % IV BOLUS (SEPSIS)
500.0000 mL | Freq: Once | INTRAVENOUS | Status: AC
  Administered 2017-01-01: 500 mL via INTRAVENOUS

## 2017-01-01 MED ORDER — PANTOPRAZOLE SODIUM 40 MG IV SOLR
40.0000 mg | Freq: Once | INTRAVENOUS | Status: AC
  Administered 2017-01-01: 40 mg via INTRAVENOUS
  Filled 2017-01-01: qty 40

## 2017-01-01 MED ORDER — ACETAMINOPHEN 325 MG PO TABS: 650.0000 mg | ORAL_TABLET | ORAL | Status: DC | PRN

## 2017-01-01 MED ORDER — LORAZEPAM 2 MG/ML IJ SOLN
1.0000 mg | Freq: Once | INTRAMUSCULAR | Status: AC | PRN
  Administered 2017-01-01: 1 mg via INTRAVENOUS
  Filled 2017-01-01: qty 1

## 2017-01-01 MED ORDER — ACETAMINOPHEN 650 MG RE SUPP
650.0000 mg | Freq: Three times a day (TID) | RECTAL | Status: DC | PRN
  Administered 2017-01-01 – 2017-01-02 (×2): 650 mg via RECTAL
  Filled 2017-01-01 (×2): qty 1

## 2017-01-01 MED ORDER — METOPROLOL TARTRATE 5 MG/5ML IV SOLN
2.5000 mg | Freq: Once | INTRAVENOUS | Status: AC
  Administered 2017-01-01: 2.5 mg via INTRAVENOUS
  Filled 2017-01-01: qty 5

## 2017-01-01 NOTE — Progress Notes (Signed)
PT Cancellation Note  Patient Details Name: Troy Brennan MRN: [id number] DOB: 1971-10-15   Cancelled Treatment:    Reason Eval/Treat Not Completed: Other (comment)   Received new order to start tomorrow;   Van Clines, PT  Acute Rehabilitation Services Pager [phone] Office [phone]    Troy Brennan 01/01/2017, 11:19 AM

## 2017-01-01 NOTE — Progress Notes (Signed)
PULMONARY / CRITICAL CARE MEDICINE   Name: Troy Brennan MRN: [id number] DOB: [birth year]    ADMISSION DATE:  12/21/2016 CONSULTATION DATE:  12/21/2016  REFERRING MD:  Dr. Blinda Leatherwood   CHIEF COMPLAINT: Cardiac Arrest  BRIEF SUMMARY:  46 year old male with ETOH and polysubstance abuse, recently discharged from detox facility 8-10 days prior to admit. Found in cardiac arrest, septic, normothermia, ARDS.  SUBJECTIVE:  RN  Concerned re aspiratoin this am > no apparent immediate sequelae  VITAL SIGNS: BP (!) 135/101   Pulse 91   Temp 100.1 F (37.8 C) (Axillary)   Resp (!) 24   Ht 6\' 1"  (1.854 m)   Wt 176 lb (79.8 kg)   SpO2 95%   BMI 23.22 kg/m   HEMODYNAMICS:    VENTILATOR SETTINGS: Vent Mode: PRVC FiO2 (%):  [30 %-40 %] 30 % Set Rate:  [16 bmp] 16 bmp Vt Set:  [460 mL-550 mL] 550 mL PEEP:  [5 cmH20] 5 cmH20 Pressure Support:  [10 cmH20] 10 cmH20 Plateau Pressure:  [16 cmH20-19 cmH20] 16 cmH20  INTAKE / OUTPUT:  Intake/Output Summary (Last 24 hours) at 01/01/17 1356 Last data filed at 01/01/17 1054  Gross per 24 hour  Intake             2520 ml  Output             2585 ml  Net              -65 ml   General:  Chronically ill appearing male in NAD HEENT: MM pink/moist, #8 trach midline c/d/i, sutures intact, L eye opaque PSY: calm Neuro: Awake, tracks, doea appear to follow some commands  "wiggle toes/ lift L vs R leg" PULM: even/non-labored, lungs bilaterally coarse WU:[id number], non-tender, bsx4 active  Extremities: warm/dry, no edema  Skin: no rashes or lesions  LABS:  BMET  Recent Labs Lab 12/30/16 0235 12/31/16 0537 01/01/17 0333  NA 147* 140 138  K 4.3 4.0 3.6  CL 110 104 100*  CO2 28 27 28   BUN 37* 40* 44*  CREATININE 0.88 0.98 1.08  GLUCOSE 80 138* 117*    Electrolytes  Recent Labs Lab 12/26/16 0453  12/30/16 0235 12/31/16 0537 01/01/17 0333  CALCIUM 8.0*  < > 8.4* 8.6* 8.6*  MG 2.6*  --   --  1.6* 2.2  PHOS  --   --   --  3.6  --    < > = values in this interval not displayed.  CBC  Recent Labs Lab 12/30/16 0235 12/31/16 0537 01/01/17 0333  WBC 12.7* 12.7* 12.5*  HGB 12.9* 13.3 13.0  HCT 40.0 41.0 40.5  PLT 124* 163 180    Coag's  Recent Labs Lab 12/29/16 0535  INR 1.15    Sepsis Markers No results for input(s): LATICACIDVEN, PROCALCITON, O2SATVEN in the last 168 hours.  ABG  Recent Labs Lab 12/28/16 1230  PHART 7.403  PCO2ART 49.3*  PO2ART 97.6    Liver Enzymes  Recent Labs Lab 12/28/16 0345  AST 55*  ALT 161*  ALKPHOS 56  BILITOT 0.4  ALBUMIN 2.1*    Cardiac Enzymes No results for input(s): TROPONINI, PROBNP in the last 168 hours.  Glucose  Recent Labs Lab 12/31/16 1152 12/31/16 2055 01/01/17 0011 01/01/17 0411 01/01/17 0812 01/01/17 1345  GLUCAP 171* 101* 135* 117* 107* 131*    Imaging Dg Chest Port 1 View  Result Date: 01/01/2017 CLINICAL DATA:  Acute respiratory failure with hypoxia. Tracheostomy. EXAM:  PORTABLE CHEST 1 VIEW COMPARISON:  One-view chest x-ray 12/31/2016 FINDINGS: Tracheostomy tube is stable position. A small bore feeding tube terminates in the stomach. Interstitial and airspace disease remains. Increasing consolidation is present in the right middle or lower lobe. IMPRESSION: 1. Increase consolidation in the right middle or lower lobe is concerning for atelectasis or less likely infection. 2. Otherwise stable interstitial prominence bilaterally, likely reflecting edema. Electronically Signed   By: Marin Roberts M.D.   On: 01/01/2017 09:54   Dg Chest Port 1 View  Result Date: 01/01/2017 CLINICAL DATA:  Aspiration of food. Cardiac arrest and acute respiratory failure. EXAM: PORTABLE CHEST 1 VIEW COMPARISON:  01/01/2017 FINDINGS: Tracheostomy tube remains in place.  Feeding tube has been removed. Right lower lung atelectasis or infiltrate shows no significant change. Heart size is normal. IMPRESSION: Right lower lung atelectasis versus infiltrate,  without significant change. Electronically Signed   By: Myles Rosenthal M.D.   On: 01/01/2017 09:52   Dg Abd Portable 1v  Result Date: 01/01/2017 CLINICAL DATA:  Projectile vomiting beginning this morning. EXAM: PORTABLE ABDOMEN - 1 VIEW COMPARISON:  12/26/2016 FINDINGS: Feeding tube is been removed since previous study. Moderately dilated small bowel loops are new since previous study. No evidence of colonic dilatation. Temperature probe noted in urinary bladder. IMPRESSION: Moderate small bowel dilatation, suspicious for distal small bowel obstruction. Electronically Signed   By: Myles Rosenthal M.D.   On: 01/01/2017 13:22    STUDIES/events:  CXR 2/14 >>  Diffuse bilateral airspace infiltrates, ? Edema, PNA, or Hemorrhagic Consolidation   CT Head 2/14 >> neg ECHO 2/15 >> 15-20 diffuse low EF Hepatitis panel >> negative MRI brain 2/20 >> Bilateral acute to early subacute ischemia within the basal ganglia. This pattern is most consistent with hypoxic ischemic injury, particularly in the context of recent cardiac arrest. Suspected petechial hemorrhage within both basal ganglia. No space-occupying hematoma. EEG 2/20 >> generalized slowing. Non-specific findings.  2/24 moved to step down / [facility]   CULTURES: Blood 2/14 >> neg Sputum 2/14 >> normal flora Urine 2/14 >> negative RVP 2/14 >> POS flu B Sputum 2/20 >> MRSA >> sens vanco  ANTIBIOTICS: Vancomycin 2/14 >> 2/16 Rocephin 2/14 >> 2/15 ceftaz 2/15 >> 2/22 Tamiflu 2/14 >> 2/19 Vanco 2/21 (MRSA sputum) >> 7 days planned  SIGNIFICANT EVENTS: 2/14  Presents to ED after being found pulseless/apneic at home 2/15  prone 2/17  supine, normothermia ongoing, PEEP / FiO2 better 2/19  Normoothermia protocol complete. Did not tolerated precedex d/t increased RR and tachycardia 2/20  Not following commands. Get MRI brain, getting EEG. Starting seroquel I low dose clonazepam.  2/21  Failed PSV. Family decided on trach. DNR, started Vanc for SA  PNA 2/22  Platelets for trach, trach at 1400 2/24  Transitioned to SDU  LINES/TUBES: ETT 2/14 >> 2/22 Trach (df) 2/22 >> 2/14 left IJ >> out 2 /14 a line rt rad >> out   Impression/plan  Resolved problems Septic shock Cardiac arrest Influenza B Aspiration PNA vs CAP (NOS) AKI Shock liver   Current problems  NEURO A: Anoxic Encephalopathy  Bilateral Basal Ganglial Stroke  P:   Continue fentanyl patch, 100 mcg Q4 + PRN Klonopin 2mg  BID  Seroquel 200 mg TID   PT consult Mobilize as able  PULMONARY A: Acute hypoxic respiratory failure  HCAP (MRSA) Failure to Wean from Vent P: Trac care per protocol, #8 in place Discontinue trach sutures after 2/28  Daily SBT / WUA  CARDIOVASCULAR  A:  Acute systolic CM s/p Cardiac arrest EF 15-20% P: Continue tele monitoring  Lasix 40 mg BID  Lopressor 50 mg BID Resumed ASA in am 2/25   DERM A: Dry gangrene changes of right hand Diffuse petechial rash P: Monitor   RENAL A: Hypernatremia  Anasarca P:   D5w to 75 ml/hr > adjust down per PC  Trend BMP / UOP  Replace electrolytes as indicated Free water 200 ml > adjust per PC     HEMATOLOGY A:  Anemia of critical illness Sepsis related thrombocytopenia resolving P: SCD's for DVT prophylaxis  Trend CBC     GI A:  Protein calorie malnutrition  Diarrhea P: TF per nutrition  PPI BID  INFECTIOUS A: HCAP (MRSA) - Vancomycine 2/21 > 7 full days planned P: Continue vancomycin    PCCM to focus on weaning issues, see him tiw   Sandrea Hughs, MD Pulmonary and Critical Care Medicine [facility] Cell [phone] After 5:30 PM or weekends, use [facility] [phone]

## 2017-01-01 NOTE — Progress Notes (Signed)
Nurse notified me about patient's abdominal pain and belly distention therefore abdominal x-ray was ordered which showed possible distal small bowel obstruction. Patient was made nothing by mouth and NG tube was ordered to be placed to low intermittent suction. Will repeat abdominal imaging tomorrow morning to reevaluate.

## 2017-01-01 NOTE — Progress Notes (Signed)
Patient was found with 2 in of cortrak tube in nostril, patient has possible aspiration. MD notified and STAT Chest X ray ordered. RRT and RN monitoring and suctioning patient.

## 2017-01-01 NOTE — Progress Notes (Signed)
Neurology Progress Note  Subjective: He was transferred to PCU yesterday. He has defervesced. He says he needs to go to the bathroom and was reminded that he has a rectal tube and a urinary catheter in place. He is not indicating any other complaints at this time but sustained attention is somewhat impaired, limited ROS.   Medications reviewed and reconciled.   Pertinent meds:  Fentanyl patch 75 mcg Fentanyl 100 mcg q4h Fentanyl 25-100 mcg q2h prn  Current Meds:   Current Facility-Administered Medications:  .  aspirin chewable tablet 81 mg, 81 mg, Per Tube, Daily, Donita Brooks, NP .  chlorhexidine gluconate (MEDLINE KIT) (PERIDEX) 0.12 % solution 15 mL, 15 mL, Mouth Rinse, BID, Raylene Miyamoto, MD, 15 mL at 12/31/16 2033 .  clonazePAM (KLONOPIN) tablet 2 mg, 2 mg, Per Tube, BID, Erick Colace, NP, 2 mg at 12/31/16 2132 .  dextrose 5 % solution, , Intravenous, Continuous, Erick Colace, NP, Last Rate: 75 mL/hr at 12/31/16 2336 .  feeding supplement (PRO-STAT SUGAR FREE 64) liquid 60 mL, 60 mL, Per Tube, TID, Raylene Miyamoto, MD, 60 mL at 12/31/16 2133 .  feeding supplement (VITAL AF 1.2 CAL) liquid 1,000 mL, 1,000 mL, Per Tube, Continuous, Raylene Miyamoto, MD, Last Rate: 25 mL/hr at 12/31/16 1531, 1,000 mL at 12/31/16 1531 .  fentaNYL (DURAGESIC - dosed mcg/hr) patch 75 mcg, 75 mcg, Transdermal, Q72H, Raylene Miyamoto, MD, 75 mcg at 12/30/16 1415 .  fentaNYL (SUBLIMAZE) injection 100 mcg, 100 mcg, Intravenous, Q4H, Raylene Miyamoto, MD, 100 mcg at 01/01/17 0509 .  fentaNYL (SUBLIMAZE) injection 25-100 mcg, 25-100 mcg, Intravenous, Q2H PRN, Donita Brooks, NP, 100 mcg at 01/01/17 0618 .  free water 200 mL, 200 mL, Per Tube, Q4H, Erick Colace, NP, 200 mL at 01/01/17 0700 .  furosemide (LASIX) injection 40 mg, 40 mg, Intravenous, BID, Raylene Miyamoto, MD, 40 mg at 12/31/16 1801 .  ibuprofen (ADVIL,MOTRIN) 100 MG/5ML suspension 400 mg, 400 mg, Per Tube, Q6H PRN, Raylene Miyamoto, MD, 400 mg at 01/01/17 0001 .  Influenza vac split quadrivalent PF (FLUARIX) injection 0.5 mL, 0.5 mL, Intramuscular, Prior to discharge, Raylene Miyamoto, MD .  MEDLINE mouth rinse, 15 mL, Mouth Rinse, 10 times per day, Raylene Miyamoto, MD, 15 mL at 01/01/17 0517 .  metoprolol tartrate (LOPRESSOR) 25 mg/10 mL oral suspension 50 mg, 50 mg, Per Tube, BID, Erick Colace, NP, 50 mg at 12/31/16 2132 .  pantoprazole sodium (PROTONIX) 40 mg/20 mL oral suspension 40 mg, 40 mg, Per Tube, QHS, Raylene Miyamoto, MD, 40 mg at 12/31/16 2132 .  pneumococcal 23 valent vaccine (PNU-IMMUNE) injection 0.5 mL, 0.5 mL, Intramuscular, Prior to discharge, Raylene Miyamoto, MD .  QUEtiapine (SEROQUEL) tablet 200 mg, 200 mg, Per Tube, TID, Raylene Miyamoto, MD, 200 mg at 12/31/16 2133 .  vancomycin (VANCOCIN) IVPB 750 mg/150 ml premix, 750 mg, Intravenous, Q8H, Melburn Popper, RPH, 750 mg at 01/01/17 0532  Objective:  Temp:  [98.7 F (37.1 C)-102.4 F (39.1 C)] 98.7 F (37.1 C) (02/25 0341) Pulse Rate:  [84-111] 84 (02/25 0341) Resp:  [18-25] 18 (02/25 0341) BP: (110-136)/(82-100) 110/82 (02/25 0341) SpO2:  [98 %-100 %] 99 % (02/25 0341) FiO2 (%):  [30 %-40 %] 30 % (02/25 0500) Weight:  [76 kg (167 lb 9.6 oz)-79.8 kg (176 lb)] 79.8 kg (176 lb) (02/25 0500)  General: WDWN lying in bed in NAD. He readily fixes and  tracks. He will answer simple questions but nodding/shaking head and by using gestures. Sustained attention is mildly impaired. He follows commands briskly.  HEENT: Neck is supple without lymphadenopathy. Trach with ETT in place. Mucous membranes are moist. There is corneal scarring on the L. There is mild conjunctival injection on the R.  CV: Regular, no murmur. Carotid pulses are 2+ and symmetric with no bruits. Distal pulses 2+ and symmetric.  Lungs: Coarse upper airway sounds bilaterally.  Extremities: No edema. The fingertips of the R hand are black and necrotic.  Neuro: MS:  As noted above. No aphasia.  CN: The R pupil is reactive from 3-->2 mm. The L pupil cannot be assessed due to overlying corneal scarring in that eye. Eyes are conjugate. EOMI appear to be intact, no nystagmus. Corneals are present bilaterally. Face is symmetric at rest with normal mobility. Tongue protrudes to midline. The remainder of his cranial nerves cannot be accurately assessed as he is unable to participate with the exam.  Motor: Normal bulk, tone. He appears to have normal strength with BUE 5/5. Effort was more variable in the LEs but he has at least 4/5. No tremor or other abnormal movements are observed.  Sensation: He indicates intact light touch throughout.    DTRs: 3+, symmetric. Toes are mute bilaterally. No pathological reflexes.  Coordination and gait: These cannot be assessed as he is unable to participate with the exam.   Labs: Lab Results  Component Value Date   WBC 12.5 (H) 01/01/2017   HGB 13.0 01/01/2017   HCT 40.5 01/01/2017   PLT 180 01/01/2017   GLUCOSE 117 (H) 01/01/2017   TRIG 699 (H) 12/30/2016   ALT 161 (H) 12/28/2016   AST 55 (H) 12/28/2016   NA 138 01/01/2017   K 3.6 01/01/2017   CL 100 (L) 01/01/2017   CREATININE 1.08 01/01/2017   BUN 44 (H) 01/01/2017   CO2 28 01/01/2017   INR 1.15 12/29/2016   CBC Latest Ref Rng & Units 01/01/2017 12/31/2016 12/30/2016  WBC 4.0 - 10.5 K/uL 12.5(H) 12.7(H) 12.7(H)  Hemoglobin 13.0 - 17.0 g/dL 13.0 13.3 12.9(L)  Hematocrit 39.0 - 52.0 % 40.5 41.0 40.0  Platelets 150 - 400 K/uL 180 163 124(L)    No results found for: HGBA1C Lab Results  Component Value Date   ALT 161 (H) 12/28/2016   AST 55 (H) 12/28/2016   ALKPHOS 56 12/28/2016   BILITOT 0.4 12/28/2016    Radiology:  No new neuroimaging.   A/P:   1. Anoxic brain injury: This is due to cardiac arrest and prolonged hypoxic respiratory failure in the setting of ARDS due to influenza pneumonitis and superimposed bacterial PNA. Treatment is supportive as below.    2. Anoxic encephalopathy: This is acute, due to anoxic brain injury from cardiac arrest and ARDS. He is doing quite well neurologically, able to follow commands consistently and very alert, appropriately answering questions for me. Continue with supportive care . Avoid hypoxemia and hypotension for even brief intervals as these are associated with worse neurologic outcomes. Fever and hyperglycemia must be aggressively treated for the same reason. We will continue to follow the exam to aid with neurologic prognosis.   He will be an excellent candidate for acute rehab once his acute medical issues have resolved. Await formal evaluation by PT/OT.   No family was present at the time of my visit.   I have no further recommendations at this time and will sign off. Please call with any questions  or concerns.   Melba Coon, MD [facility]

## 2017-01-01 NOTE — Progress Notes (Signed)
PROGRESS NOTE    Carlis Blanchard  FFM:[id number] DOB: 1970/11/20 DOA: 12/21/2016 PCP: No PCP Per Patient   Brief Narrative:  46 year old male with ETOH and polysubstance abuse, recently discharged from detox facility 8-10 days prior to admit. Found in cardiac arrest, septic, normothermia, ARDS. He was admitted to the hospital for cardiac arrest on 12/21/2016. ROSC was achieved about 15 minutes later. He was admitted to the ICU first intubated and treatment for influenza B pneumonia superimposed bacterial pneumonia along with ARDS started he was on prone ventilation and chemical paralytics. Neurology was consulted as well. Lurline Idol was placed by pulmonary on 12/29/2016. He has been doing well since then in terms of his breathing. He was transferred to stepdown unit on 12/31/2016 for further care.   Assessment & Plan:   Active Problems:   Cardiac arrest [facility])   Acute encephalopathy   Acute respiratory failure ([place])   Community acquired pneumonia   Ventilator dependent ([place])   Encounter for central line placement   Pressure injury of skin   Anoxic brain injury ([place])   Anoxic encephalopathy ([facility])  Acute Hypoxic Resp Failure- likely from PNA vs fluid overload  -currently has trach in place, wean vent settings as tolerated  -suctioning as needed, RT on board -Pulm following  -supplemental O2 as needed  -MRSA PNA- on IV Vanc - D5   Anxoxic Brain Injury/Encephalopathy  -patient has shown some progress in term of his mental activity  -still remains nonverbal. Neurology is following  -maintain adequate O2 sats and BP -cont neurochecks   Systolic CHF ef 57% s/p Cardiac arrest  -cont current meds for now   Nutrition  -currently on tube feeds, NG was out this morning and there were concerns of aspiration.  -RT did perform suction and CXR looks ok at his time  -will cont to monitor him, aspiration precaution in place   Thrombocytopenia - improving  -likely from chronic illness    Hypernatremia- improved   DVT prophylaxis: SCDs Code Status: DNR Disposition Plan: Will need acute rehab. PT to see the patient.   Consultants:   Neuro and Pulm   Procedures:   Trach 12/29/16  Antimicrobials:   Vanc D5   Subjective: No acute events overnight. His NG tube did, this morning with concerns of possible aspiration. Respiratory therapy and nursing staff were able to suction out some tube feed residual from his mouth. Chest x-ray doesn't show any aspiration at this time.  Objective: Vitals:   01/01/17 0500 01/01/17 0800 01/01/17 0900 01/01/17 0927  BP:  (!) 135/101    Pulse:  93    Resp:  (!) 22    Temp:  100.1 F (37.8 C)    TempSrc:  Axillary    SpO2:  98% 93% 95%  Weight: 79.8 kg (176 lb)     Height:        Intake/Output Summary (Last 24 hours) at 01/01/17 1159 Last data filed at 01/01/17 1054  Gross per 24 hour  Intake             2570 ml  Output             2585 ml  Net              -15 ml   Filed Weights   12/31/16 0100 12/31/16 1653 01/01/17 0500  Weight: 77.7 kg (171 lb 4.8 oz) 76 kg (167 lb 9.6 oz) 79.8 kg (176 lb)    Examination:  General exam: Chronically  ill-appearing. Trach in place Respiratory system: Clear to auscultation. Respiratory effort normal. Cardiovascular system: S1 & S2 heard, RRR. No JVD, murmurs, rubs, gallops or clicks. No pedal edema. Gastrointestinal system: Abdomen is nondistended, soft and nontender. No organomegaly or masses felt. Normal bowel sounds heard. Central nervous system: Alert and oriented. He is able to answer simple questions by nodding his head and using gestures. He remains nonverbal. Extremities: Symmetric 5 x 5 power. Skin:fingertips of the R hand are black and necrotic.  Psychiatry: Judgement and insight appear normal. Mood & affect appropriate.     Data Reviewed:   CBC:  Recent Labs Lab 12/28/16 0345 12/29/16 0245 12/30/16 0235 12/31/16 0537 01/01/17 0333  WBC 18.9* 13.6* 12.7* 12.7*  12.5*  NEUTROABS  --   --   --  10.3*  --   HGB 11.9* 11.9* 12.9* 13.3 13.0  HCT 38.3* 38.5* 40.0 41.0 40.5  MCV 89.7 89.7 88.5 87.0 86.9  PLT 66* 75* 124* 163 127   Basic Metabolic Panel:  Recent Labs Lab 12/26/16 0453  12/28/16 0345 12/29/16 0245 12/30/16 0235 12/31/16 0537 01/01/17 0333  NA 152*  < > 150* 148* 147* 140 138  K 3.7  < > 3.8 3.9 4.3 4.0 3.6  CL 107  < > 110 115* 110 104 100*  CO2 36*  < > 32 25 28 27 28   GLUCOSE 126*  < > 91 86 80 138* 117*  BUN 65*  < > 39* 34* 37* 40* 44*  CREATININE 1.36*  < > 1.03 0.88 0.88 0.98 1.08  CALCIUM 8.0*  < > 7.9* 7.8* 8.4* 8.6* 8.6*  MG 2.6*  --   --   --   --  1.6* 2.2  PHOS  --   --   --   --   --  3.6  --   < > = values in this interval not displayed. GFR: Estimated Creatinine Clearance: 97.5 mL/min (by C-G formula based on SCr of 1.08 mg/dL). Liver Function Tests:  Recent Labs Lab 12/28/16 0345  AST 55*  ALT 161*  ALKPHOS 56  BILITOT 0.4  PROT 5.6*  ALBUMIN 2.1*   No results for input(s): LIPASE, AMYLASE in the last 168 hours. No results for input(s): AMMONIA in the last 168 hours. Coagulation Profile:  Recent Labs Lab 12/29/16 0535  INR 1.15   Cardiac Enzymes: No results for input(s): CKTOTAL, CKMB, CKMBINDEX, TROPONINI in the last 168 hours. BNP (last 3 results) No results for input(s): PROBNP in the last 8760 hours. HbA1C: No results for input(s): HGBA1C in the last 72 hours. CBG:  Recent Labs Lab 12/31/16 1152 12/31/16 2055 01/01/17 0011 01/01/17 0411 01/01/17 0812  GLUCAP 171* 101* 135* 117* 107*   Lipid Profile:  Recent Labs  12/30/16 0235  TRIG 699*   Thyroid Function Tests: No results for input(s): TSH, T4TOTAL, FREET4, T3FREE, THYROIDAB in the last 72 hours. Anemia Panel: No results for input(s): VITAMINB12, FOLATE, FERRITIN, TIBC, IRON, RETICCTPCT in the last 72 hours. Sepsis Labs: No results for input(s): PROCALCITON, LATICACIDVEN in the last 168 hours.  Recent Results  (from the past 240 hour(s))  Culture, respiratory (NON-Expectorated)     Status: None   Collection Time: 12/27/16  7:50 AM  Result Value Ref Range Status   Specimen Description TRACHEAL ASPIRATE  Final   Special Requests NONE  Final   Gram Stain   Final    FEW WBC PRESENT, PREDOMINANTLY PMN RARE SQUAMOUS EPITHELIAL CELLS PRESENT FEW GRAM POSITIVE  COCCI IN PAIRS    Culture FEW METHICILLIN RESISTANT STAPHYLOCOCCUS AUREUS  Final   Report Status 12/29/2016 FINAL  Final   Organism ID, Bacteria METHICILLIN RESISTANT STAPHYLOCOCCUS AUREUS  Final      Susceptibility   Methicillin resistant staphylococcus aureus - MIC*    CIPROFLOXACIN <=0.5 SENSITIVE Sensitive     ERYTHROMYCIN <=0.25 SENSITIVE Sensitive     GENTAMICIN <=0.5 SENSITIVE Sensitive     OXACILLIN >=4 RESISTANT Resistant     TETRACYCLINE <=1 SENSITIVE Sensitive     VANCOMYCIN 1 SENSITIVE Sensitive     TRIMETH/SULFA <=10 SENSITIVE Sensitive     CLINDAMYCIN <=0.25 SENSITIVE Sensitive     RIFAMPIN <=0.5 SENSITIVE Sensitive     Inducible Clindamycin NEGATIVE Sensitive     * FEW METHICILLIN RESISTANT STAPHYLOCOCCUS AUREUS         Radiology Studies: Dg Chest Port 1 View  Result Date: 01/01/2017 CLINICAL DATA:  Acute respiratory failure with hypoxia. Tracheostomy. EXAM: PORTABLE CHEST 1 VIEW COMPARISON:  One-view chest x-ray 12/31/2016 FINDINGS: Tracheostomy tube is stable position. A small bore feeding tube terminates in the stomach. Interstitial and airspace disease remains. Increasing consolidation is present in the right middle or lower lobe. IMPRESSION: 1. Increase consolidation in the right middle or lower lobe is concerning for atelectasis or less likely infection. 2. Otherwise stable interstitial prominence bilaterally, likely reflecting edema. Electronically Signed   By: San Morelle M.D.   On: 01/01/2017 09:54   Dg Chest Port 1 View  Result Date: 01/01/2017 CLINICAL DATA:  Aspiration of food. Cardiac arrest and  acute respiratory failure. EXAM: PORTABLE CHEST 1 VIEW COMPARISON:  01/01/2017 FINDINGS: Tracheostomy tube remains in place.  Feeding tube has been removed. Right lower lung atelectasis or infiltrate shows no significant change. Heart size is normal. IMPRESSION: Right lower lung atelectasis versus infiltrate, without significant change. Electronically Signed   By: Earle Gell M.D.   On: 01/01/2017 09:52   Dg Chest Port 1 View  Result Date: 12/31/2016 CLINICAL DATA:  Acute respiratory acidosis. EXAM: PORTABLE CHEST 1 VIEW COMPARISON:  12/30/2016. FINDINGS: Tracheostomy is midline. Feeding tube is followed into the stomach with the tip projecting beyond the inferior margin of the image. Heart size normal. Mild diffuse interstitial prominence and indistinctness, slightly improved. No definite pleural fluid. IMPRESSION: Mild diffuse interstitial prominence and indistinctness, slightly improved. Findings may be due to improving edema, aspiration or pneumonia. Electronically Signed   By: Lorin Picket M.D.   On: 12/31/2016 07:29        Scheduled Meds: . aspirin  81 mg Per Tube Daily  . chlorhexidine gluconate (MEDLINE KIT)  15 mL Mouth Rinse BID  . clonazePAM  2 mg Per Tube BID  . feeding supplement (PRO-STAT SUGAR FREE 64)  60 mL Per Tube TID  . fentaNYL  75 mcg Transdermal Q72H  . fentaNYL (SUBLIMAZE) injection  100 mcg Intravenous Q4H  . free water  200 mL Per Tube Q4H  . furosemide  40 mg Intravenous BID  . mouth rinse  15 mL Mouth Rinse 10 times per day  . metoprolol tartrate  50 mg Per Tube BID  . pantoprazole sodium  40 mg Per Tube QHS  . QUEtiapine  200 mg Per Tube TID  . vancomycin  750 mg Intravenous Q8H   Continuous Infusions: . dextrose 75 mL/hr at 12/31/16 2336  . feeding supplement (VITAL AF 1.2 CAL) 1,000 mL (12/31/16 1531)     LOS: 11 days    Time spent: 35 mins  Ahmad Vanwey Arsenio Loader, MD [facility] Pager [phone]   If 7PM-7AM, please contact  night-coverage [email or web address] Password [email or web address] 01/01/2017, 11:59 AM

## 2017-01-02 ENCOUNTER — Inpatient Hospital Stay (HOSPITAL_COMMUNITY): Payer: Self-pay

## 2017-01-02 DIAGNOSIS — Z930 Tracheostomy status: Secondary | ICD-10-CM

## 2017-01-02 LAB — HEMOGLOBIN AND HEMATOCRIT, BLOOD
HCT: 42 % (ref 39.0–52.0)
Hemoglobin: 13.8 g/dL (ref 13.0–17.0)

## 2017-01-02 LAB — GLUCOSE, CAPILLARY
GLUCOSE-CAPILLARY: 125 mg/dL — AB (ref 65–99)
GLUCOSE-CAPILLARY: 78 mg/dL (ref 65–99)
Glucose-Capillary: 111 mg/dL — ABNORMAL HIGH (ref 65–99)
Glucose-Capillary: 116 mg/dL — ABNORMAL HIGH (ref 65–99)
Glucose-Capillary: 116 mg/dL — ABNORMAL HIGH (ref 65–99)
Glucose-Capillary: 96 mg/dL (ref 65–99)

## 2017-01-02 LAB — BASIC METABOLIC PANEL
Anion gap: 11 (ref 5–15)
BUN: 41 mg/dL — AB (ref 6–20)
CALCIUM: 8.8 mg/dL — AB (ref 8.9–10.3)
CO2: 27 mmol/L (ref 22–32)
Chloride: 97 mmol/L — ABNORMAL LOW (ref 101–111)
Creatinine, Ser: 1.24 mg/dL (ref 0.61–1.24)
GFR calc Af Amer: >60 mL/min (ref >60)
GLUCOSE: 118 mg/dL — AB (ref 65–99)
Potassium: 4 mmol/L (ref 3.5–5.1)
Sodium: 135 mmol/L (ref 135–145)

## 2017-01-02 LAB — VANCOMYCIN, TROUGH: VANCOMYCIN TR: 17 ug/mL (ref 15–20)

## 2017-01-02 MED ORDER — FENTANYL 25 MCG/HR TD PT72: 50.0000 ug | MEDICATED_PATCH | TRANSDERMAL | Status: DC

## 2017-01-02 MED ORDER — LORAZEPAM 2 MG/ML IJ SOLN
1.0000 mg | Freq: Once | INTRAMUSCULAR | Status: AC
  Administered 2017-01-02: 1 mg via INTRAVENOUS
  Filled 2017-01-02: qty 1

## 2017-01-02 MED ORDER — SODIUM CHLORIDE 0.9 % IV BOLUS (SEPSIS)
500.0000 mL | Freq: Once | INTRAVENOUS | Status: AC
  Administered 2017-01-02: 500 mL via INTRAVENOUS

## 2017-01-02 MED ORDER — FREE WATER: 200.0000 mL | Freq: Three times a day (TID) | Status: DC

## 2017-01-02 MED ORDER — ACETAMINOPHEN 160 MG/5ML PO SOLN
650.0000 mg | Freq: Four times a day (QID) | ORAL | Status: DC | PRN
  Administered 2017-01-02 – 2017-01-06 (×7): 650 mg
  Filled 2017-01-02 (×6): qty 20.3

## 2017-01-02 MED ORDER — CLONAZEPAM 1 MG PO TABS
1.0000 mg | ORAL_TABLET | Freq: Two times a day (BID) | ORAL | Status: DC
  Administered 2017-01-02 – 2017-01-04 (×4): 1 mg
  Filled 2017-01-02 (×4): qty 1

## 2017-01-02 MED ORDER — HALOPERIDOL LACTATE 5 MG/ML IJ SOLN
5.0000 mg | Freq: Once | INTRAMUSCULAR | Status: AC
  Administered 2017-01-02: 5 mg via INTRAVENOUS
  Filled 2017-01-02: qty 1

## 2017-01-02 MED ORDER — QUETIAPINE FUMARATE 100 MG PO TABS
200.0000 mg | ORAL_TABLET | Freq: Two times a day (BID) | ORAL | Status: DC
  Administered 2017-01-03 – 2017-01-04 (×3): 200 mg
  Filled 2017-01-02 (×3): qty 2

## 2017-01-02 NOTE — Progress Notes (Signed)
PULMONARY / CRITICAL CARE MEDICINE   Name: Troy Brennan MRN: [id number] DOB: 23-May-1971    ADMISSION DATE:  12/21/2016 CONSULTATION DATE:  12/21/2016  REFERRING MD:  Dr. Blinda Leatherwood   CHIEF COMPLAINT: Cardiac Arrest  BRIEF SUMMARY:  46 year old male with ETOH and polysubstance abuse, recently discharged from detox facility 8-10 days prior to admit. Found 2/14  in cardiac arrest, septic, normothermia, ARDS.  SUBJECTIVE:   Febrile No resp distress  VITAL SIGNS: BP 131/88   Pulse 92   Temp 100.3 F (37.9 C) (Rectal)   Resp (!) 29   Ht 6\' 1"  (1.854 m)   Wt 170 lb 4.8 oz (77.2 kg)   SpO2 98%   BMI 22.47 kg/m   HEMODYNAMICS:    VENTILATOR SETTINGS: Vent Mode: PSV FiO2 (%):  [28 %-40 %] 28 % Set Rate:  [16 bmp] 16 bmp Vt Set:  [550 mL] 550 mL PEEP:  [5 cmH20] 5 cmH20 Pressure Support:  [5 cmH20-10 cmH20] 5 cmH20 Plateau Pressure:  [14 cmH20-15 cmH20] 15 cmH20  INTAKE / OUTPUT:  Intake/Output Summary (Last 24 hours) at 01/02/17 1719 Last data filed at 01/02/17 1417  Gross per 24 hour  Intake             1870 ml  Output             3275 ml  Net            -1405 ml   General:  Chronically ill appearing male in NAD HEENT: MM pink/moist, #8 trach midline c/d/i, sutures intact, L eye opaque PSY: calm Neuro: Awake, tracks, follows 1 step commands  PULM: even/non-labored, lungs bilaterally coarse WU:JWJX, non-tender, bsx4 active  Extremities: warm/dry, no edema  Skin: no rashes or lesions  LABS:  BMET  Recent Labs Lab 12/31/16 0537 01/01/17 0333 01/02/17 0350  NA 140 138 135  K 4.0 3.6 4.0  CL 104 100* 97*  CO2 27 28 27   BUN 40* 44* 41*  CREATININE 0.98 1.08 1.24  GLUCOSE 138* 117* 118*    Electrolytes  Recent Labs Lab 12/31/16 0537 01/01/17 0333 01/02/17 0350  CALCIUM 8.6* 8.6* 8.8*  MG 1.6* 2.2  --   PHOS 3.6  --   --     CBC  Recent Labs Lab 12/30/16 0235 12/31/16 0537 01/01/17 0333 01/02/17 0902  WBC 12.7* 12.7* 12.5*  --    HGB 12.9* 13.3 13.0 13.8  HCT 40.0 41.0 40.5 42.0  PLT 124* 163 180  --     Coag's  Recent Labs Lab 12/29/16 0535  INR 1.15    Sepsis Markers No results for input(s): LATICACIDVEN, PROCALCITON, O2SATVEN in the last 168 hours.  ABG  Recent Labs Lab 12/28/16 1230  PHART 7.403  PCO2ART 49.3*  PO2ART 97.6    Liver Enzymes  Recent Labs Lab 12/28/16 0345  AST 55*  ALT 161*  ALKPHOS 56  BILITOT 0.4  ALBUMIN 2.1*    Cardiac Enzymes No results for input(s): TROPONINI, PROBNP in the last 168 hours.  Glucose  Recent Labs Lab 01/01/17 1944 01/02/17 0008 01/02/17 0315 01/02/17 0845 01/02/17 1242 01/02/17 1624  GLUCAP 149* 111* 116* 116* 125* 96    Imaging Dg Abd 1 View  Result Date: 01/01/2017 CLINICAL DATA:  Nasal/orogastric tube placement. EXAM: ABDOMEN - 1 VIEW COMPARISON:  01/01/2017 at 12:33 p.m. FINDINGS: The tip of the nasal/orogastric tube extends into the proximal stomach. Next item there is persistent small bowel dilation consistent with a partial  obstruction, unchanged from the earlier exam. IMPRESSION: Nasal/orogastric tube tip projects in the proximal stomach. Electronically Signed   By: Amie Portland M.D.   On: 01/01/2017 18:22   Dg Abd Portable 1v  Result Date: 01/02/2017 CLINICAL DATA:  Small bowel obstruction EXAM: PORTABLE ABDOMEN - 1 VIEW COMPARISON:  01/01/2017 FINDINGS: Persistent gaseous distension of small bowel loops in mid abdomen consistent with significant ileus or partial small bowel obstruction. Probable bladder catheter. Some colonic gas noted in transverse colon and descending colon. IMPRESSION: Persistent gaseous distended small bowel loops mid abdomen consistent with significant ileus or partial small bowel obstruction. Electronically Signed   By: Natasha Mead M.D.   On: 01/02/2017 07:52    STUDIES/events:  CXR 2/14 >>  Diffuse bilateral airspace infiltrates, ? Edema, PNA, or Hemorrhagic Consolidation   CT Head 2/14 >> neg ECHO  2/15 >> 15-20 diffuse low EF Hepatitis panel >> negative MRI brain 2/20 >> Bilateral acute to early subacute ischemia within the basal ganglia. This pattern is most consistent with hypoxic ischemic injury, particularly in the context of recent cardiac arrest. Suspected petechial hemorrhage within both basal ganglia. No space-occupying hematoma. EEG 2/20 >> generalized slowing. Non-specific findings.  2/24 moved to step down / [facility]   CULTURES: Blood 2/14 >> neg Sputum 2/14 >> normal flora Urine 2/14 >> negative RVP 2/14 >> POS flu B Sputum 2/20 >> MRSA >> sens vanco  ANTIBIOTICS: Vancomycin 2/14 >> 2/16 Rocephin 2/14 >> 2/15 ceftaz 2/15 >> 2/22 Tamiflu 2/14 >> 2/19 Vanco 2/21 (MRSA sputum) >> 7 days planned  SIGNIFICANT EVENTS: 2/14  Presents to ED after being found pulseless/apneic at home 2/15  prone 2/17  supine, normothermia ongoing, PEEP / FiO2 better 2/19  Normoothermia protocol complete. Did not tolerated precedex d/t increased RR and tachycardia 2/20  Not following commands. Get MRI brain, getting EEG. Starting seroquel I low dose clonazepam.  2/21  Failed PSV. Family decided on trach. DNR, started Vanc for SA PNA 2/22  Platelets for trach, trach at 1400 2/24  Transitioned to SDU  LINES/TUBES: ETT 2/14 >> 2/22 Trach (df) 2/22 >> 2/14 left IJ >> out 2 /14 a line rt rad >> out   Impression/plan  Resolved problems Septic shock Cardiac arrest Influenza B Aspiration PNA vs CAP (NOS) AKI Shock liver   Current problems  NEURO A: Anoxic Encephalopathy  Bilateral Basal Ganglial Stroke  P:   Continue fentanyl patch, 100 mcg Q4 + PRN Drop Klonopin 1mg  BID  Drop Seroquel 200 mg bID   PT consult Mobilize as able  PULMONARY A: Acute hypoxic respiratory failure  HCAP (MRSA) Failure to Wean from Vent P: Trac care per protocol, #8 in place Discontinue trach sutures after 2/28  Daily SBT - goal ATc daytime, tolerating PS 5/5  today  CARDIOVASCULAR A:  Acute systolic CM s/p Cardiac arrest EF 15-20% P:  tele  Lasix 40 mg BID  Lopressor 50 mg BID ASA  DERM A: Dry gangrene changes of right hand Diffuse petechial rash P: Monitor   RENAL A: Hypernatremia -resolved Anasarca P:   D5w to 75 ml/hr > adjust down per PC  Trend BMP / UOP  Replace electrolytes as indicated Free water     INFECTIOUS A: HCAP (MRSA) - Vancomycine 2/21 > 7  days planned P: Continue vancomycin    PCCM to focus on weaning issues, hope to liberate him from vent  Cyril Mourning MD. FCCP. [facility] Pager [phone] If no response  call [phone]   01/02/2017

## 2017-01-02 NOTE — Evaluation (Signed)
Physical Therapy Evaluation Patient Details Name: Troy Brennan MRN: [id number] DOB: Mar 05, 1971 Today's Date: 01/02/2017   History of Present Illness  46 year old male with ETOH and polysubstance abuse, recently discharged from detox facility 8-10 days prior to admit. Found in cardiac arrest, septic, normothermia, ARDS.  Clinical Impression  Patient demonstrates deficits in functional mobility as indicated below. Will need continued skilled PT to address deficits and maximize function. Will see as indicated and progress as tolerated. At this time anticipate patient will need comprehensive therapies upon acute discharge, recommend CIR consult as well as OT consult.    Follow Up Recommendations CIR;Supervision/Assistance - 24 hour    Equipment Recommendations  Other (comment) (TBD)    Recommendations for Other Services Rehab consult     Precautions / Restrictions Precautions Precautions: Fall Precaution Comments: NG tube, trach, peg Restrictions Weight Bearing Restrictions: No      Mobility  Bed Mobility Overal bed mobility: Needs Assistance Bed Mobility: Rolling;Supine to Sit;Sit to Supine Rolling: Total assist;+2 for physical assistance   Supine to sit: Max assist;+2 for physical assistance Sit to supine: Total assist;+2 for physical assistance   General bed mobility comments: patient initiating LE elevation back to bed  Transfers Overall transfer level: Needs assistance Equipment used: 2 person hand held assist (face to face with gait belt and chuck pad) Transfers: Sit to/from Stand Sit to Stand: Max assist;+2 physical assistance         General transfer comment: minimal clearance from bed, patient attempting to initiate push with BUEs  Ambulation/Gait                Stairs            Wheelchair Mobility    Modified Rankin (Stroke Patients Only)       Balance Overall balance assessment: Needs assistance   Sitting balance-Leahy Scale:  Poor Sitting balance - Comments: min assist for EOB sitting balance Postural control: Posterior lean   Standing balance-Leahy Scale: Zero                               Pertinent Vitals/Pain Pain Assessment: Faces Faces Pain Scale: No hurt    Home Living Family/patient expects to be discharged to:: Unsure                      Prior Function Level of Independence: Independent               Hand Dominance        Extremity/Trunk Assessment   Upper Extremity Assessment Upper Extremity Assessment: RUE deficits/detail;LUE deficits/detail;Difficult to assess due to impaired cognition RUE Deficits / Details: limited AROM, 2/5 strength gross motions RUE Coordination: decreased fine motor;decreased gross motor LUE Deficits / Details: limited AROM, 2/5 strength gross motions LUE Coordination: decreased fine motor;decreased gross motor    Lower Extremity Assessment Lower Extremity Assessment: RLE deficits/detail;LLE deficits/detail;Difficult to assess due to impaired cognition RLE Deficits / Details: limited AROM 2/5 gross motions RLE Coordination: decreased fine motor;decreased gross motor LLE Deficits / Details: limited AROM 2/5 gross motions LLE Coordination: decreased fine motor;decreased gross motor       Communication   Communication: Tracheostomy  Cognition Arousal/Alertness: Awake/alert Behavior During Therapy: Restless Overall Cognitive Status: Difficult to assess                 General Comments: patient able to follow simple one step commands consistently  General Comments General comments (skin integrity, edema, etc.): PROM performed all extremities, limited withdrawl noted. Patient toelrate EOB ~10 minute follow simple commands consistenly.     Exercises     Assessment/Plan    PT Assessment Patient needs continued PT services  PT Problem List Decreased strength;Decreased range of motion;Decreased activity  tolerance;Decreased balance;Decreased mobility;Decreased coordination;Decreased cognition;Decreased knowledge of use of DME;Decreased safety awareness;Cardiopulmonary status limiting activity;Pain       PT Treatment Interventions DME instruction;Gait training;Functional mobility training;Therapeutic activities;Therapeutic exercise;Balance training;Neuromuscular re-education;Cognitive remediation;Patient/family education    PT Goals (Current goals can be found in the Care Plan section)  Acute Rehab PT Goals PT Goal Formulation: Patient unable to participate in goal setting Time For Goal Achievement: 01/16/17 Potential to Achieve Goals: Fair    Frequency Min 3X/week   Barriers to discharge        Co-evaluation               End of Session Equipment Utilized During Treatment: Oxygen Activity Tolerance: Patient tolerated treatment well Patient left: in bed;with call bell/phone within reach;with bed alarm set Nurse Communication: Mobility status PT Visit Diagnosis: Other symptoms and signs involving the nervous system (R29.898)         Time: 1610-9604 PT Time Calculation (min) (ACUTE ONLY): 23 min   Charges:   PT Evaluation $PT Eval High Complexity: 1 Procedure PT Treatments $Therapeutic Activity: 8-22 mins   PT G Codes:         Fabio Asa 01/05/2017, 11:42 AM Charlotte Crumb, PT DPT  [phone]

## 2017-01-02 NOTE — Progress Notes (Signed)
Pharmacy Antibiotic Note  Troy Brennan is a 46 y.o. male continues on vancomycin for MRSA pneumonia. Vancomycin trough drawn today is therapeutic at 18 (goal 15-20). Renal function: Scr 1.24 increased from 1.08.  CrCl~80 ml/min. Tc/Tmax= 102.7, WBC trended down to 12.5 (2/25),  Steady state vancomycin trough = 17 mcg/ml , within therapeutic goal 15-20, Vancomycin  750 mg IV q8h day #6/7 for MRSA PNA.   Plan: 1) Continue vancomycin 750mg  IV q8h  Height: 6\' 1"  (185.4 cm) Weight: 170 lb 4.8 oz (77.2 kg) IBW/kg (Calculated) : 79.9  Temp (24hrs), Avg:100.7 F (38.2 C), Min:98.6 F (37 C), Max:102.3 F (39.1 C)   Recent Labs Lab 12/28/16 0345 12/29/16 0245 12/30/16 0235 12/30/16 1125 12/31/16 0537 01/01/17 0333 01/02/17 0350 01/02/17 1934  WBC 18.9* 13.6* 12.7*  --  12.7* 12.5*  --   --   CREATININE 1.03 0.88 0.88  --  0.98 1.08 1.24  --   VANCOTROUGH  --   --   --  18  --   --   --  17    Estimated Creatinine Clearance: 82.1 mL/min (by C-G formula based on SCr of 1.24 mg/dL).    No Known Allergies  Antimicrobials this admission: Tamiflu 2/14 >> 2/19 Vancomycin 2/14>>2/16, resumed 2/21 >> CTX 2/14>>2/15 Ceftaz 2/15>>2/22 Zosyn 2/14 x1  Dose adjustments this admission: 2/23 VT = 18 on 750mg  IV q8 (drawn ~ 30 minutes early, actual trough closer to 16)  Microbiology results:  2/14- influB 2/14 MRSA PCR- neg 2/14 resp- normal flora 2/14 urine-ng 2/14 blood x2-neg 2/20 TA MRSA  Thank you for allowing pharmacy to be a part of this patient's care. Noah Delaine, RPh Clinical Pharmacist 01/02/2017 8:33 PM

## 2017-01-02 NOTE — Progress Notes (Addendum)
[facility] TEAM 1 - Stepdown/ICU TEAM  Darby Shadwick Monterroso  XNT:[id number] DOB: [birth year] DOA: 12/21/2016 PCP: No PCP Per Patient    Brief Narrative:  46 year old male with ETOH and polysubstance abuse, recently discharged from detox facility 8-10 days prior to admit, who was found in cardiac arrest on 12/21/16. ROSC was achieved about 15 minutes later. He was admitted to the ICU and treated for influenza B w/ superimposed bacterial pneumonia along with ARDS.  He required prone ventilation and chemical paralytics. Lurline Idol was placed by PCCM on 12/29/2016.  He was transferred to the stepdown unit on 12/31/2016.  Subjective: The patient is unresponsive/noncommunicative.  There is no family present at the time of my visit.  Assessment & Plan:  Acute Hypoxic Resp Failure - Influenza B + PNA + ARDS PCCM directing care of vent support and trach  MRSA PNA  Felt to be HCAP - to complete 7 days of Vanc 2/27  Anoxic Encephalopathy  patient has reportedly shown some progress in term of his mental activity - is obtunded at the time of my visit today   ?Ileus v/s SBO Pt had a large watery BM this evening - hold TF overnight - recheck KUB - plan to resume tube feeds in AM if improved   Acute Systolic CHF s/p Cardiac arrest  EF 15-20% - no evidence of signif volume overload at this time - follow Is/Os Filed Weights   01/01/17 0500 01/02/17 0317 01/02/17 0822  Weight: 79.8 kg (176 lb) 77.1 kg (169 lb 14.4 oz) 77.2 kg (170 lb 4.8 oz)    Dry gangrene R hand  Remains dry on exam w/o evidence of superinfection at this time   Thrombocytopenia due to severe acute illness   Hypernatremia  Resolved - slow free water   DVT prophylaxis: SCDs Code Status: DNR - NO CODE  Family Communication: no family present at time of exam  Disposition Plan: SDU  Consultants:  PCCM Neurology   Procedures: 2/22 Trach  Antimicrobials:  Zosyn > Rocephin > Ceftaz 2/13 > 2/21 Vanc 2/14 > 2/16 + 2/21 >      Objective: Blood pressure 131/88, pulse 92, temperature 100.3 F (37.9 C), temperature source Rectal, resp. rate (!) 29, height 6' 1"  (1.854 m), weight 77.2 kg (170 lb 4.8 oz), SpO2 98 %.  Intake/Output Summary (Last 24 hours) at 01/02/17 1716 Last data filed at 01/02/17 1417  Gross per 24 hour  Intake             1870 ml  Output             3275 ml  Net            -1405 ml   Filed Weights   01/01/17 0500 01/02/17 0317 01/02/17 0822  Weight: 79.8 kg (176 lb) 77.1 kg (169 lb 14.4 oz) 77.2 kg (170 lb 4.8 oz)    Examination: General: No acute respiratory distress evident  Lungs: Clear to auscultation bilaterally without wheeze Cardiovascular: Regular rate and rhythm without murmur gallop or rub normal S1 and S2 Abdomen: Nontender, nondistended, soft, bowel sounds positive, no rebound, no ascites, no appreciable mass Extremities: No significant cyanosis, clubbing, or edema bilateral lower extremities  CBC:  Recent Labs Lab 12/28/16 0345 12/29/16 0245 12/30/16 0235 12/31/16 0537 01/01/17 0333 01/02/17 0902  WBC 18.9* 13.6* 12.7* 12.7* 12.5*  --   NEUTROABS  --   --   --  10.3*  --   --   HGB 11.9*  11.9* 12.9* 13.3 13.0 13.8  HCT 38.3* 38.5* 40.0 41.0 40.5 42.0  MCV 89.7 89.7 88.5 87.0 86.9  --   PLT 66* 75* 124* 163 180  --    Basic Metabolic Panel:  Recent Labs Lab 12/29/16 0245 12/30/16 0235 12/31/16 0537 01/01/17 0333 01/02/17 0350  NA 148* 147* 140 138 135  K 3.9 4.3 4.0 3.6 4.0  CL 115* 110 104 100* 97*  CO2 25 28 27 28 27   GLUCOSE 86 80 138* 117* 118*  BUN 34* 37* 40* 44* 41*  CREATININE 0.88 0.88 0.98 1.08 1.24  CALCIUM 7.8* 8.4* 8.6* 8.6* 8.8*  MG  --   --  1.6* 2.2  --   PHOS  --   --  3.6  --   --    GFR: Estimated Creatinine Clearance: 82.1 mL/min (by C-G formula based on SCr of 1.24 mg/dL).  Liver Function Tests:  Recent Labs Lab 12/28/16 0345  AST 55*  ALT 161*  ALKPHOS 56  BILITOT 0.4  PROT 5.6*  ALBUMIN 2.1*    Coagulation  Profile:  Recent Labs Lab 12/29/16 0535  INR 1.15   CBG:  Recent Labs Lab 01/02/17 0008 01/02/17 0315 01/02/17 0845 01/02/17 1242 01/02/17 1624  GLUCAP 111* 116* 116* 125* 96    Recent Results (from the past 240 hour(s))  Culture, respiratory (NON-Expectorated)     Status: None   Collection Time: 12/27/16  7:50 AM  Result Value Ref Range Status   Specimen Description TRACHEAL ASPIRATE  Final   Special Requests NONE  Final   Gram Stain   Final    FEW WBC PRESENT, PREDOMINANTLY PMN RARE SQUAMOUS EPITHELIAL CELLS PRESENT FEW GRAM POSITIVE COCCI IN PAIRS    Culture FEW METHICILLIN RESISTANT STAPHYLOCOCCUS AUREUS  Final   Report Status 12/29/2016 FINAL  Final   Organism ID, Bacteria METHICILLIN RESISTANT STAPHYLOCOCCUS AUREUS  Final      Susceptibility   Methicillin resistant staphylococcus aureus - MIC*    CIPROFLOXACIN <=0.5 SENSITIVE Sensitive     ERYTHROMYCIN <=0.25 SENSITIVE Sensitive     GENTAMICIN <=0.5 SENSITIVE Sensitive     OXACILLIN >=4 RESISTANT Resistant     TETRACYCLINE <=1 SENSITIVE Sensitive     VANCOMYCIN 1 SENSITIVE Sensitive     TRIMETH/SULFA <=10 SENSITIVE Sensitive     CLINDAMYCIN <=0.25 SENSITIVE Sensitive     RIFAMPIN <=0.5 SENSITIVE Sensitive     Inducible Clindamycin NEGATIVE Sensitive     * FEW METHICILLIN RESISTANT STAPHYLOCOCCUS AUREUS     Scheduled Meds: . aspirin  81 mg Per Tube Daily  . chlorhexidine gluconate (MEDLINE KIT)  15 mL Mouth Rinse BID  . clonazePAM  2 mg Per Tube BID  . feeding supplement (PRO-STAT SUGAR FREE 64)  60 mL Per Tube TID  . fentaNYL  75 mcg Transdermal Q72H  . fentaNYL (SUBLIMAZE) injection  100 mcg Intravenous Q4H  . free water  200 mL Per Tube Q4H  . furosemide  40 mg Intravenous BID  . mouth rinse  15 mL Mouth Rinse 10 times per day  . metoprolol tartrate  50 mg Per Tube BID  . pantoprazole sodium  40 mg Per Tube QHS  . QUEtiapine  200 mg Per Tube TID  . vancomycin  750 mg Intravenous Q8H   Continuous  Infusions: . dextrose 75 mL/hr at 12/31/16 2336  . feeding supplement (VITAL AF 1.2 CAL) 1,000 mL (12/31/16 1531)     LOS: 12 days   Cherene Altes, MD  [facility] Office  [phone] Pager - Text Page per Shea Evans as per below:  On-Call/Text Page:      [email or web address]      password [email or web address]  If 7PM-7AM, please contact night-coverage [email or web address] Password [email or web address] 01/02/2017, 5:16 PM

## 2017-01-02 NOTE — Consult Note (Addendum)
Physical Medicine and Rehabilitation Consult Reason for Consult: Cardiac arrest/sepsis/hypoxia Referring Physician: [facility]   HPI: Troy Brennan is a 46 y.o. right handed male with history of alcohol polysubstance abuse recently discharged from a detox facility 8-10 days ago. History taken from chart review. Presented 12/21/2016 with flulike symptoms body aches cough and low-grade fever reported by his girlfriend. He was found down at neck and pulseless by his girlfriend. Underwent multiple rounds of CPR by first responders. Upon arrival to the ED was nonresponsive intubated for airway protection. Cranial CT scan negative. Chest x-ray suspect noncardiogenic pulmonary edema. Urine drug screen positive opiates, benzodiazepines and marijuana. Alcohol negative. Troponin 0.22. Echocardiogram with ejection fraction 20% severe diffuse hypokinesis. EEG negative for seizure. Neurology follow-up for altered mental status. MRI of the brain showed bilateral acute to early subacute ischemia within the basal ganglia. Pattern consistent with hypoxic ischemic injury. Hepatitis panel negative. Patient was later extubated and required tracheostomy presently with a #8 cuffed trach 12/28/2016. Reports of abdominal pain with abdominal distention. X-ray showed possible distal small bowel obstruction. A nasogastric tube was placed. Ongoing bouts of restlessness and agitation. Physical therapy evaluation completed 01/02/2017 with recommendations of physical medicine rehabilitation consult.   Review of Systems  Unable to perform ROS: Patient nonverbal   History reviewed. No pertinent past medical history, unable to obtain from patient. No past surgical history on file, unable to obtain from patient. No family history on file, unable to obtain from patient. Social History:  has no tobacco, alcohol, and drug history on file., unable to obtain from patient Allergies: No Known Allergies No prescriptions prior to  admission.    Home: Home Living Family/patient expects to be discharged to:: Unsure  Functional History: Prior Function Level of Independence: Independent Functional Status:  Mobility: Bed Mobility Overal bed mobility: Needs Assistance Bed Mobility: Rolling, Supine to Sit, Sit to Supine Rolling: Total assist, +2 for physical assistance Supine to sit: Max assist, +2 for physical assistance Sit to supine: Total assist, +2 for physical assistance General bed mobility comments: patient initiating LE elevation back to bed Transfers Overall transfer level: Needs assistance Equipment used: 2 person hand held assist (face to face with gait belt and chuck pad) Transfers: Sit to/from Stand Sit to Stand: Max assist, +2 physical assistance General transfer comment: minimal clearance from bed, patient attempting to initiate push with BUEs      ADL:    Cognition: Cognition Overall Cognitive Status: Difficult to assess Orientation Level: Oriented to person, Oriented to place, Oriented to time, Disoriented to situation Cognition Arousal/Alertness: Awake/alert Behavior During Therapy: Restless Overall Cognitive Status: Difficult to assess General Comments: patient able to follow simple one step commands consistently Difficult to assess due to: Impaired communication, Tracheostomy  Blood pressure 131/88, pulse 92, temperature (!) 100.7 F (38.2 C), temperature source Rectal, resp. rate (!) 29, height 6\' 1"  (1.854 m), weight 77.2 kg (170 lb 4.8 oz), SpO2 96 %. Physical Exam  Vitals reviewed. Constitutional: He appears well-developed.  Frail, b/l restraints  HENT:  Head: Normocephalic.  Scattered abrasions  Eyes: EOM are normal. Right eye exhibits no discharge. Left eye exhibits no discharge.  Pupil sluggish to light  Neck:  Tracheostomy in place  Cardiovascular: Normal rate and regular rhythm.   Respiratory: Effort normal.  Decreased breath sounds at the bases  GI: Soft. Bowel  sounds are normal. He exhibits no distension.  +NG  Genitourinary:  Genitourinary Comments: +Rectal tube  Musculoskeletal: He exhibits no edema  or tenderness.  Neurological: He is alert.  Patient is restless remains nonverbal.  He followed very few one-step simple commands due to his restlessness.  Exam remained limited due to patient's decrease in participation, however, moving all extremities DTRs 3+ throughout  Skin: Skin is warm and dry.  Scattered abrasions  Psychiatric:  Unable to assess due to pt nonverbal    Results for orders placed or performed during the hospital encounter of 12/21/16 (from the past 24 hour(s))  Glucose, capillary     Status: Abnormal   Collection Time: 01/01/17  4:59 PM  Result Value Ref Range   Glucose-Capillary 118 (H) 65 - 99 mg/dL  Glucose, capillary     Status: Abnormal   Collection Time: 01/01/17  7:44 PM  Result Value Ref Range   Glucose-Capillary 149 (H) 65 - 99 mg/dL  Glucose, capillary     Status: Abnormal   Collection Time: 01/02/17 12:08 AM  Result Value Ref Range   Glucose-Capillary 111 (H) 65 - 99 mg/dL  Glucose, capillary     Status: Abnormal   Collection Time: 01/02/17  3:15 AM  Result Value Ref Range   Glucose-Capillary 116 (H) 65 - 99 mg/dL  Basic metabolic panel     Status: Abnormal   Collection Time: 01/02/17  3:50 AM  Result Value Ref Range   Sodium 135 135 - 145 mmol/L   Potassium 4.0 3.5 - 5.1 mmol/L   Chloride 97 (L) 101 - 111 mmol/L   CO2 27 22 - 32 mmol/L   Glucose, Bld 118 (H) 65 - 99 mg/dL   BUN 41 (H) 6 - 20 mg/dL   Creatinine, Ser 6.96 0.61 - 1.24 mg/dL   Calcium 8.8 (L) 8.9 - 10.3 mg/dL   GFR calc non Af Amer >60 >60 mL/min   GFR calc Af Amer >60 >60 mL/min   Anion gap 11 5 - 15  Glucose, capillary     Status: Abnormal   Collection Time: 01/02/17  8:45 AM  Result Value Ref Range   Glucose-Capillary 116 (H) 65 - 99 mg/dL  Hemoglobin and hematocrit, blood     Status: None   Collection Time: 01/02/17  9:02 AM   Result Value Ref Range   Hemoglobin 13.8 13.0 - 17.0 g/dL   HCT 29.5 28.4 - 13.2 %  Glucose, capillary     Status: Abnormal   Collection Time: 01/02/17 12:42 PM  Result Value Ref Range   Glucose-Capillary 125 (H) 65 - 99 mg/dL   Dg Abd 1 View  Result Date: 01/01/2017 CLINICAL DATA:  Nasal/orogastric tube placement. EXAM: ABDOMEN - 1 VIEW COMPARISON:  01/01/2017 at 12:33 p.m. FINDINGS: The tip of the nasal/orogastric tube extends into the proximal stomach. Next item there is persistent small bowel dilation consistent with a partial obstruction, unchanged from the earlier exam. IMPRESSION: Nasal/orogastric tube tip projects in the proximal stomach. Electronically Signed   By: Amie Portland M.D.   On: 01/01/2017 18:22   Dg Chest Port 1 View  Result Date: 01/01/2017 CLINICAL DATA:  Acute respiratory failure with hypoxia. Tracheostomy. EXAM: PORTABLE CHEST 1 VIEW COMPARISON:  One-view chest x-ray 12/31/2016 FINDINGS: Tracheostomy tube is stable position. A small bore feeding tube terminates in the stomach. Interstitial and airspace disease remains. Increasing consolidation is present in the right middle or lower lobe. IMPRESSION: 1. Increase consolidation in the right middle or lower lobe is concerning for atelectasis or less likely infection. 2. Otherwise stable interstitial prominence bilaterally, likely reflecting edema. Electronically Signed  By: Marin Roberts M.D.   On: 01/01/2017 09:54   Dg Chest Port 1 View  Result Date: 01/01/2017 CLINICAL DATA:  Aspiration of food. Cardiac arrest and acute respiratory failure. EXAM: PORTABLE CHEST 1 VIEW COMPARISON:  01/01/2017 FINDINGS: Tracheostomy tube remains in place.  Feeding tube has been removed. Right lower lung atelectasis or infiltrate shows no significant change. Heart size is normal. IMPRESSION: Right lower lung atelectasis versus infiltrate, without significant change. Electronically Signed   By: Myles Rosenthal M.D.   On: 01/01/2017 09:52     Dg Abd Portable 1v  Result Date: 01/02/2017 CLINICAL DATA:  Small bowel obstruction EXAM: PORTABLE ABDOMEN - 1 VIEW COMPARISON:  01/01/2017 FINDINGS: Persistent gaseous distension of small bowel loops in mid abdomen consistent with significant ileus or partial small bowel obstruction. Probable bladder catheter. Some colonic gas noted in transverse colon and descending colon. IMPRESSION: Persistent gaseous distended small bowel loops mid abdomen consistent with significant ileus or partial small bowel obstruction. Electronically Signed   By: Natasha Mead M.D.   On: 01/02/2017 07:52   Dg Abd Portable 1v  Result Date: 01/01/2017 CLINICAL DATA:  Projectile vomiting beginning this morning. EXAM: PORTABLE ABDOMEN - 1 VIEW COMPARISON:  12/26/2016 FINDINGS: Feeding tube is been removed since previous study. Moderately dilated small bowel loops are new since previous study. No evidence of colonic dilatation. Temperature probe noted in urinary bladder. IMPRESSION: Moderate small bowel dilatation, suspicious for distal small bowel obstruction. Electronically Signed   By: Myles Rosenthal M.D.   On: 01/01/2017 13:22    Assessment/Plan: Diagnosis: Anoxic BI Labs and images independently reviewed.  Records reviewed and summated above.  NeuroPsych evaluation for behavorial assessment.  Provide environmental management by reducing the level of stimulation, tolerating restlessness when possible, protecting patient from harming self or others and reducing patient's cognitive confusion.  Address behavioral concerns include providing structured environments and daily routines.  Cognitive therapy to direct modular abilities in order to maintain goals  including problem solving, self regulation/monitoring, self management, attention, and memory.  Fall precautions; pt at risk for second impact syndrome  Consider Propranolol for agitation and storming  Avoid medications that could impair cognitive abilities, such as  anticholinergics, antihistaminic, benzodiazapines, narcotics, etc when possible  1. Does the need for close, 24 hr/day medical supervision in concert with the patient's rehab needs make it unreasonable for this patient to be served in a less intensive setting? Yes 2. Co-Morbidities requiring supervision/potential complications: small bowel obstruction (cont to monitor), Systolic CHF (Monitor in accordance with increased physical activity and avoid UE resistance excercises), opiates, benzodiazepines, etoh and marijuana abuse (counsel when appropriate), Cardiac arrest/sepsis/hypoxia, pyrexia and leukocytosis/SIRS (cont to monitor for signs and symptoms of infection, further workup if indicated), tachypnea (monitor RR and O2 Sats with increased physical exertion), hyperglycemia (Monitor in accordance with exercise and adjust meds as necessary), agitation (see above, wean IV fentanyl when appropriate), respiratory failure (wean from vent when tolerated) 3. Due to bladder management, bowel management, safety, skin/wound care, disease management, medication administration, pain management and patient education, does the patient require 24 hr/day rehab nursing? Yes 4. Does the patient require coordinated care of a physician, rehab nurse, PT (1-2 hrs/day, 5 days/week), OT (1-2 hrs/day, 5 days/week) and SLP (1-2 hrs/day, 5 days/week) to address physical and functional deficits in the context of the above medical diagnosis(es)? Yes Addressing deficits in the following areas: balance, endurance, locomotion, strength, transferring, bowel/bladder control, bathing, dressing, feeding, grooming, toileting, cognition, speech, language, swallowing and psychosocial  support 5. Can the patient actively participate in an intensive therapy program of at least 3 hrs of therapy per day at least 5 days per week? Potentially 6. The potential for patient to make measurable gains while on inpatient rehab is excellent 7. Anticipated  functional outcomes upon discharge from inpatient rehab are min assist and mod assist  with PT, min assist and mod assist with OT, min assist and mod assist with SLP. 8. Estimated rehab length of stay to reach the above functional goals is: 28-32 days. 9. Does the patient have adequate social supports and living environment to accommodate these discharge functional goals? Potentially 10. Anticipated D/C setting: LTAC 11. Anticipated post D/C treatments: LTAC 12. Overall Rehab/Functional Prognosis: fair  RECOMMENDATIONS: This patient's condition is appropriate for continued rehabilitative care in the following setting: Recommend LTAC based on current status.  Will have to inquire about caregiver support if CIR given the fact that patient will require 24/7 assistance post- discharge. Patient has agreed to participate in recommended program. Potentially Note that insurance prior authorization may be required for reimbursement for recommended care.  Comment: Rehab Admissions Coordinator to follow up.  Maryla Morrow, MD, [place] Dom Charlton Amor., PA-C 01/02/2017

## 2017-01-02 NOTE — Progress Notes (Signed)
Vent on standby patient on 28% ATC per MD's request, patient tolerating well,  Patient has a good strong productive cough and can clear his own secretions, SATS 100%, RR 27 BPM, HR 95 BPM, BP 145/84, will continue to monitor patient.

## 2017-01-03 ENCOUNTER — Inpatient Hospital Stay (HOSPITAL_COMMUNITY): Payer: Self-pay

## 2017-01-03 DIAGNOSIS — K56609 Unspecified intestinal obstruction, unspecified as to partial versus complete obstruction: Secondary | ICD-10-CM

## 2017-01-03 DIAGNOSIS — D72829 Elevated white blood cell count, unspecified: Secondary | ICD-10-CM

## 2017-01-03 DIAGNOSIS — R0682 Tachypnea, not elsewhere classified: Secondary | ICD-10-CM

## 2017-01-03 DIAGNOSIS — R739 Hyperglycemia, unspecified: Secondary | ICD-10-CM

## 2017-01-03 DIAGNOSIS — R451 Restlessness and agitation: Secondary | ICD-10-CM

## 2017-01-03 DIAGNOSIS — R509 Fever, unspecified: Secondary | ICD-10-CM

## 2017-01-03 DIAGNOSIS — R6510 Systemic inflammatory response syndrome (SIRS) of non-infectious origin without acute organ dysfunction: Secondary | ICD-10-CM

## 2017-01-03 DIAGNOSIS — F1910 Other psychoactive substance abuse, uncomplicated: Secondary | ICD-10-CM

## 2017-01-03 DIAGNOSIS — I5021 Acute systolic (congestive) heart failure: Secondary | ICD-10-CM

## 2017-01-03 LAB — BASIC METABOLIC PANEL
Anion gap: 10 (ref 5–15)
BUN: 42 mg/dL — AB (ref 6–20)
CHLORIDE: 97 mmol/L — AB (ref 101–111)
CO2: 28 mmol/L (ref 22–32)
CREATININE: 1.07 mg/dL (ref 0.61–1.24)
Calcium: 8.5 mg/dL — ABNORMAL LOW (ref 8.9–10.3)
GFR calc Af Amer: >60 mL/min (ref >60)
GFR calc non Af Amer: >60 mL/min (ref >60)
GLUCOSE: 111 mg/dL — AB (ref 65–99)
POTASSIUM: 3.7 mmol/L (ref 3.5–5.1)
Sodium: 135 mmol/L (ref 135–145)

## 2017-01-03 LAB — GLUCOSE, CAPILLARY
GLUCOSE-CAPILLARY: 108 mg/dL — AB (ref 65–99)
GLUCOSE-CAPILLARY: 80 mg/dL (ref 65–99)
Glucose-Capillary: 70 mg/dL (ref 65–99)
Glucose-Capillary: 74 mg/dL (ref 65–99)
Glucose-Capillary: 87 mg/dL (ref 65–99)

## 2017-01-03 MED ORDER — LORAZEPAM 2 MG/ML IJ SOLN
2.0000 mg | Freq: Once | INTRAMUSCULAR | Status: AC
  Administered 2017-01-03: 2 mg via INTRAVENOUS
  Filled 2017-01-03: qty 1

## 2017-01-03 MED ORDER — VITAL AF 1.2 CAL PO LIQD
1000.0000 mL | ORAL | Status: DC
  Administered 2017-01-03 – 2017-01-04 (×2): 1000 mL
  Filled 2017-01-03 (×3): qty 1000

## 2017-01-03 MED ORDER — SODIUM CHLORIDE 0.9 % IV SOLN
400.0000 mg | Freq: Once | INTRAVENOUS | Status: AC
  Administered 2017-01-03: 400 mg via INTRAVENOUS
  Filled 2017-01-03: qty 4

## 2017-01-03 MED ORDER — DEXTROSE 5 % IV SOLN
1.0000 g | Freq: Three times a day (TID) | INTRAVENOUS | Status: DC
  Administered 2017-01-03 – 2017-01-04 (×3): 1 g via INTRAVENOUS
  Filled 2017-01-03 (×5): qty 1

## 2017-01-03 MED ORDER — SODIUM CHLORIDE 0.9 % IV BOLUS (SEPSIS)
500.0000 mL | Freq: Once | INTRAVENOUS | Status: AC
  Administered 2017-01-03: 500 mL via INTRAVENOUS

## 2017-01-03 NOTE — Progress Notes (Signed)
SLP Cancellation Note  Patient Details Name: Troy Brennan MRN: [id number] DOB: 02/22/1971   Cancelled treatment:       Reason Eval/Treat Not Completed: Medical issues which prohibited therapy. On vent, will follow for PMSV readiness with weaning.    Macy Lingenfelter, Riley Nearing 01/03/2017, 8:50 AM

## 2017-01-03 NOTE — Progress Notes (Addendum)
[place] TEAM 1 - Stepdown/ICU TEAM  Troy Brennan  WUX:[id number] DOB: 29-Sep-1971 DOA: 12/21/2016 PCP: No PCP Per Patient    Brief Narrative:  46 year old male with ETOH and polysubstance abuse, recently discharged from detox facility 8-10 days prior to admit, who was found in cardiac arrest on 12/21/16. ROSC was achieved about 15 minutes later. He was admitted to the ICU and treated for influenza B w/ superimposed bacterial pneumonia along with ARDS.  He required prone ventilation and chemical paralytics. Lurline Idol was placed by PCCM on 12/29/2016.  He was transferred to the stepdown unit on 12/31/2016.  Subjective: Febrile, No resp distress  Assessment & Plan:  Acute Hypoxic Resp Failure - Influenza B + PNA + ARDS PCCM directing care of vent support and trach Trac care per protocol, #8 in place Discontinue trach sutures after 2/28  2/26 7 hours on t collar 2/27 plan 24 hours t collar.  Per PCCM   MRSA PNA  Felt to be HCAP - to complete 7 days of Vanc 2/27 Will add fortaz due to high grade fever yesterday and continue vanc  [facility] 2/26 nGSF   Anoxic Encephalopathy, Bilateral Basal Ganglial Stroke, patient has reportedly shown some progress in term of his mental activity - is still obtunded  Continue seroquel and klonopin  ?Ileus v/s SBO Pt had a large watery BM this evening - hold TF overnight - KUB shows improvement  - plan to resume tube feeds at a slow rate    Acute Systolic CHF s/p Cardiac arrest  EF 15-20% - no evidence of signif volume overload at this time - follow Is/Os Filed Weights   01/02/17 0317 01/02/17 0822 01/03/17 0454  Weight: 77.1 kg (169 lb 14.4 oz) 77.2 kg (170 lb 4.8 oz) 78.1 kg (172 lb 1.6 oz)    Dry gangrene R hand  Remains dry on exam w/o evidence of superinfection at this time   Thrombocytopenia due to severe acute illness   Hypernatremia  Resolved -  150>135   DVT prophylaxis: SCDs Code Status: DNR - NO CODE  Family Communication:  no family present at time of exam  Disposition Plan: SDU, will need palliative  Care consult,  For goc  Consultants:  PCCM Neurology   Procedures: 2/22 Trach  Antimicrobials:  Zosyn > Rocephin > Ceftaz 2/13 > 2/21 Vanc 2/14 > 2/16 + 2/21 >    Objective: Blood pressure 102/82, pulse 93, temperature (!) 100.7 F (38.2 C), temperature source Rectal, resp. rate 18, height 6' 1"  (1.854 m), weight 78.1 kg (172 lb 1.6 oz), SpO2 100 %.  Intake/Output Summary (Last 24 hours) at 01/03/17 1342 Last data filed at 01/03/17 1101  Gross per 24 hour  Intake             1520 ml  Output              800 ml  Net              720 ml   Filed Weights   01/02/17 0317 01/02/17 0822 01/03/17 0454  Weight: 77.1 kg (169 lb 14.4 oz) 77.2 kg (170 lb 4.8 oz) 78.1 kg (172 lb 1.6 oz)    Examination: General:  Chronically ill appearing male in NAD HEENT: MM pink/moist, #8 trach midline c/d/i, sutures intact, L eye opaque PSY: calm Neuro: Awake, tracks, follows 1 step commands  PULM: even/non-labored, lungs bilaterally coarse OZ:DGUY, non-tender, bsx4 active  Extremities: warm/dry, no edema  Skin: no rashes or  lesions  CBC:  Recent Labs Lab 12/28/16 0345 12/29/16 0245 12/30/16 0235 12/31/16 0537 01/01/17 0333 01/02/17 0902  WBC 18.9* 13.6* 12.7* 12.7* 12.5*  --   NEUTROABS  --   --   --  10.3*  --   --   HGB 11.9* 11.9* 12.9* 13.3 13.0 13.8  HCT 38.3* 38.5* 40.0 41.0 40.5 42.0  MCV 89.7 89.7 88.5 87.0 86.9  --   PLT 66* 75* 124* 163 180  --    Basic Metabolic Panel:  Recent Labs Lab 12/30/16 0235 12/31/16 0537 01/01/17 0333 01/02/17 0350 01/03/17 0333  NA 147* 140 138 135 135  K 4.3 4.0 3.6 4.0 3.7  CL 110 104 100* 97* 97*  CO2 28 27 28 27 28   GLUCOSE 80 138* 117* 118* 111*  BUN 37* 40* 44* 41* 42*  CREATININE 0.88 0.98 1.08 1.24 1.07  CALCIUM 8.4* 8.6* 8.6* 8.8* 8.5*  MG  --  1.6* 2.2  --   --   PHOS  --  3.6  --   --   --    GFR: Estimated Creatinine Clearance: 96.3  mL/min (by C-G formula based on SCr of 1.07 mg/dL).  Liver Function Tests:  Recent Labs Lab 12/28/16 0345  AST 55*  ALT 161*  ALKPHOS 56  BILITOT 0.4  PROT 5.6*  ALBUMIN 2.1*    Coagulation Profile:  Recent Labs Lab 12/29/16 0535  INR 1.15   CBG:  Recent Labs Lab 01/02/17 1624 01/02/17 1949 01/03/17 0028 01/03/17 0433 01/03/17 0801  GLUCAP 96 78 87 108* 80    Recent Results (from the past 240 hour(s))  Culture, respiratory (NON-Expectorated)     Status: None   Collection Time: 12/27/16  7:50 AM  Result Value Ref Range Status   Specimen Description TRACHEAL ASPIRATE  Final   Special Requests NONE  Final   Gram Stain   Final    FEW WBC PRESENT, PREDOMINANTLY PMN RARE SQUAMOUS EPITHELIAL CELLS PRESENT FEW GRAM POSITIVE COCCI IN PAIRS    Culture FEW METHICILLIN RESISTANT STAPHYLOCOCCUS AUREUS  Final   Report Status 12/29/2016 FINAL  Final   Organism ID, Bacteria METHICILLIN RESISTANT STAPHYLOCOCCUS AUREUS  Final      Susceptibility   Methicillin resistant staphylococcus aureus - MIC*    CIPROFLOXACIN <=0.5 SENSITIVE Sensitive     ERYTHROMYCIN <=0.25 SENSITIVE Sensitive     GENTAMICIN <=0.5 SENSITIVE Sensitive     OXACILLIN >=4 RESISTANT Resistant     TETRACYCLINE <=1 SENSITIVE Sensitive     VANCOMYCIN 1 SENSITIVE Sensitive     TRIMETH/SULFA <=10 SENSITIVE Sensitive     CLINDAMYCIN <=0.25 SENSITIVE Sensitive     RIFAMPIN <=0.5 SENSITIVE Sensitive     Inducible Clindamycin NEGATIVE Sensitive     * FEW METHICILLIN RESISTANT STAPHYLOCOCCUS AUREUS  Culture, blood (routine x 2)     Status: None (Preliminary result)   Collection Time: 01/02/17  6:01 AM  Result Value Ref Range Status   Specimen Description BLOOD BLOOD LEFT HAND  Final   Special Requests IN PEDIATRIC BOTTLE 1CC  Final   Culture NO GROWTH 1 DAY  Final   Report Status PENDING  Incomplete  Culture, blood (routine x 2)     Status: None (Preliminary result)   Collection Time: 01/02/17  6:03 AM    Result Value Ref Range Status   Specimen Description BLOOD BLOOD RIGHT HAND  Final   Special Requests IN PEDIATRIC BOTTLE 1CC  Final   Culture NO GROWTH 1 DAY  Final   Report Status PENDING  Incomplete     Scheduled Meds: . aspirin  81 mg Per Tube Daily  . chlorhexidine gluconate (MEDLINE KIT)  15 mL Mouth Rinse BID  . clonazePAM  1 mg Per Tube BID  . [START ON 01/05/2017] fentaNYL  50 mcg Transdermal Q72H  . mouth rinse  15 mL Mouth Rinse 10 times per day  . metoprolol tartrate  50 mg Per Tube BID  . pantoprazole sodium  40 mg Per Tube QHS  . QUEtiapine  200 mg Per Tube BID  . vancomycin  750 mg Intravenous Q8H   Continuous Infusions: . dextrose 50 mL/hr at 01/03/17 0456     LOS: 13 days     [facility] Office  [phone] Pager - Text Page per Shea Evans as per below:     If 7PM-7AM, please contact night-coverage [email or web address] Password [email or web address] 01/03/2017, 1:42 PM

## 2017-01-03 NOTE — Progress Notes (Signed)
PULMONARY / CRITICAL CARE MEDICINE   Name: Troy Brennan MRN: [id number] DOB: 1971/06/23    ADMISSION DATE:  12/21/2016 CONSULTATION DATE:  12/21/2016  REFERRING MD:  Dr. Blinda Leatherwood   CHIEF COMPLAINT: Cardiac Arrest  BRIEF SUMMARY:  46 year old male with ETOH and polysubstance abuse, recently discharged from detox facility 8-10 days prior to admit. Found 2/14  in cardiac arrest, septic, normothermia, ARDS.  SUBJECTIVE:   NAD  VITAL SIGNS: BP (!) 115/92 (BP Location: Left Arm)   Pulse 77   Temp 100 F (37.8 C) (Rectal)   Resp 17   Ht 6\' 1"  (1.854 m)   Wt 172 lb 1.6 oz (78.1 kg)   SpO2 92%   BMI 22.71 kg/m   HEMODYNAMICS:    VENTILATOR SETTINGS: Vent Mode: PRVC FiO2 (%):  [28 %-30 %] 30 % Set Rate:  [16 bmp] 16 bmp Vt Set:  [550 mL] 550 mL PEEP:  [5 cmH20] 5 cmH20 Pressure Support:  [5 cmH20] 5 cmH20 Plateau Pressure:  [12 cmH20] 12 cmH20  INTAKE / OUTPUT:  Intake/Output Summary (Last 24 hours) at 01/03/17 0849 Last data filed at 01/03/17 0805  Gross per 24 hour  Intake             1520 ml  Output             1100 ml  Net              420 ml   General:  Chronically ill appearing male in NAD, on full vent HEENT: MM pink/moist, #8 trach midline c/d/i, sutures intact, L eye opaque PSY: calm Neuro: Awake, tracks, follows 1 step commands  PULM: even/non-labored, lungs bilaterally coarse EC:XFQH, non-tender, bsx4 active TF Extremities: warm/dry, no edema  Skin: no rashes or lesions  LABS:  BMET  Recent Labs Lab 01/01/17 0333 01/02/17 0350 01/03/17 0333  NA 138 135 135  K 3.6 4.0 3.7  CL 100* 97* 97*  CO2 28 27 28   BUN 44* 41* 42*  CREATININE 1.08 1.24 1.07  GLUCOSE 117* 118* 111*    Electrolytes  Recent Labs Lab 12/31/16 0537 01/01/17 0333 01/02/17 0350 01/03/17 0333  CALCIUM 8.6* 8.6* 8.8* 8.5*  MG 1.6* 2.2  --   --   PHOS 3.6  --   --   --     CBC  Recent Labs Lab 12/30/16 0235 12/31/16 0537 01/01/17 0333 01/02/17 0902   WBC 12.7* 12.7* 12.5*  --   HGB 12.9* 13.3 13.0 13.8  HCT 40.0 41.0 40.5 42.0  PLT 124* 163 180  --     Coag's  Recent Labs Lab 12/29/16 0535  INR 1.15    Sepsis Markers No results for input(s): LATICACIDVEN, PROCALCITON, O2SATVEN in the last 168 hours.  ABG  Recent Labs Lab 12/28/16 1230  PHART 7.403  PCO2ART 49.3*  PO2ART 97.6    Liver Enzymes  Recent Labs Lab 12/28/16 0345  AST 55*  ALT 161*  ALKPHOS 56  BILITOT 0.4  ALBUMIN 2.1*    Cardiac Enzymes No results for input(s): TROPONINI, PROBNP in the last 168 hours.  Glucose  Recent Labs Lab 01/02/17 1242 01/02/17 1624 01/02/17 1949 01/03/17 0028 01/03/17 0433 01/03/17 0801  GLUCAP 125* 96 78 87 108* 80    Imaging Dg Abd Portable 1v  Result Date: 01/03/2017 CLINICAL DATA:  46 year old male. Recent cardiac arrest. Possible small bowel obstruction or ileus since 01/01/2017. Initial encounter. EXAM: PORTABLE ABDOMEN - 1 VIEW COMPARISON:  01/02/2017 and  01/01/2017, and earlier FINDINGS: Portable AP supine view at 0637 hours. An enteric tube tip is at the level of the gastric cardia. This would place the side hole within the distal thoracic esophagus. Bowel gas pattern is improved since 01/01/2017. There is a residual a 4 cm mid abdominal small bowel loop, but other gas containing small bowel loops are normal. Gas throughout nondilated large bowel. Two catheters or electrodes project over the pelvis as before. No acute osseous abnormality identified. No definite pneumoperitoneum on this supine view. IMPRESSION: 1. Enteric tube tip just inside the stomach indicating the side hole is in the distal esophagus. Advance 8 cm to allow for side hole placement within the stomach. 2. Improving bowel gas pattern since 01/01/2017 compatible with resolving small bowel ileus or obstruction. Electronically Signed   By: Odessa Fleming M.D.   On: 01/03/2017 07:35    STUDIES/events:  CXR 2/14 >>  Diffuse bilateral airspace  infiltrates, ? Edema, PNA, or Hemorrhagic Consolidation   CT Head 2/14 >> neg ECHO 2/15 >> 15-20 diffuse low EF Hepatitis panel >> negative MRI brain 2/20 >> Bilateral acute to early subacute ischemia within the basal ganglia. This pattern is most consistent with hypoxic ischemic injury, particularly in the context of recent cardiac arrest. Suspected petechial hemorrhage within both basal ganglia. No space-occupying hematoma. EEG 2/20 >> generalized slowing. Non-specific findings.  2/24 moved to step down / Triad primary   CULTURES: Blood 2/14 >> neg Sputum 2/14 >> normal flora Urine 2/14 >> negative RVP 2/14 >> POS flu B Sputum 2/20 >> MRSA >> sens vanco  ANTIBIOTICS: Vancomycin 2/14 >> 2/16 Rocephin 2/14 >> 2/15 ceftaz 2/15 >> 2/22 Tamiflu 2/14 >> 2/19 Vanco 2/21 (MRSA sputum) >> 7 days planned  SIGNIFICANT EVENTS: 2/14  Presents to ED after being found pulseless/apneic at home 2/15  prone 2/17  supine, normothermia ongoing, PEEP / FiO2 better 2/19  Normoothermia protocol complete. Did not tolerated precedex d/t increased RR and tachycardia 2/20  Not following commands. Get MRI brain, getting EEG. Starting seroquel I low dose clonazepam.  2/21  Failed PSV. Family decided on trach. DNR, started Vanc for SA PNA 2/22  Platelets for trach, trach at 1400 2/24  Transitioned to SDU 2/27 7 hours on t collar  LINES/TUBES: ETT 2/14 >> 2/22 Trach (df) 2/22 >> 2/14 left IJ >> out 2 /14 a line rt rad >> out   Impression/plan  Resolved problems Septic shock Cardiac arrest Influenza B Aspiration PNA vs CAP (NOS) AKI Shock liver   Current problems  NEURO A: Anoxic Encephalopathy  Bilateral Basal Ganglial Stroke  P:   Continue fentanyl patch, 100 mcg Q4 + PRN Drop Klonopin 1mg  BID  Drop Seroquel 200 mg bID  (per IM) PT consult Mobilize as able  PULMONARY A: Acute hypoxic respiratory failure  HCAP (MRSA) Failure to Wean from Vent P: Trac care per protocol, #8 in  place Discontinue trach sutures after 2/28  2/26 7 hours on t collar 2/27 plan 24 hours t collar. Suspect he will do well.  CARDIOVASCULAR A:  Acute systolic CM s/p Cardiac arrest EF 15-20% P:  tele  Per IM  DERM A: Dry gangrene changes of right hand Diffuse petechial rash P: Monitor  Per IM  RENAL  Recent Labs Lab 01/01/17 0333 01/02/17 0350 01/03/17 0333  NA 138 135 135   Lab Results  Component Value Date   CREATININE 1.07 01/03/2017   CREATININE 1.24 01/02/2017   CREATININE 1.08 01/01/2017  Recent Labs Lab 01/01/17 0333 01/02/17 0350 01/03/17 0333  K 3.6 4.0 3.7     A: Hypernatremia -resolved Anasarca P: Per IM    INFECTIOUS A: HCAP (MRSA) - Vancomycine 2/21 > 7  days planned P: Continue vancomycin    PCCM to focus on weaning issues, hope to liberate him from vent. Tolerated t collar 2/26 for 7 hours, 2/27 plan for 24 hours  Brett Canales Minor ACNP [facility] P[facility] Pager [phone] till 3 pm If no answer page [phone] 01/03/2017, 8:49 AM

## 2017-01-03 NOTE — Progress Notes (Addendum)
Nutrition Follow Up  INTERVENTION:    Re-start Vital AF 1.2 formula at 25 ml/hr  As medically appropriate, increase by 10 ml every 6 hours to goal rate of 75 ml/hr  TF regimen to provide 2160 kcals, 135 gm protein, 1460 ml of free water  NUTRITION DIAGNOSIS:   Inadequate oral intake related to inability to eat as evidenced by NPO status, ongoing  GOAL:   Patient will meet greater than or equal to 90% of their needs, met  MONITOR:   TF tolerance, Vent status, Labs, Weight trends, I & O's  ASSESSMENT:   Pt with PMH of ETOH and polysubstance abuse, recently discharged from detox facility 8-10 days ago. Found in cardiac arrest, septic, normothermia, ards.  Patient is currently on ventilator support >> trach  MV: 9.1 L/min Temp (24hrs), Avg:101.1 F (38.4 C), Min:99.6 F (37.6 C), Max:103.3 F (39.6 C)  Propofol discontinued 2/23  Pt with anoxic encephalopathy, bilateral basal ganglial stroke. Vital AF 1.2 previously infusing at 25 ml/hr via CORTRAK feeding tube >> removed? Pt with abdominal pain and distention 2/25. Large bore NGT placed to LIS.  KUB shows improvement.  Speech Path following for PMSV readiness with weaning. Labs and medications reviewed. CBG's 87-108-80.  Verbal with Read Back order received per Dr. Allyson Sabal to re-start TF at trickle rate.  Diet Order:  Diet NPO time specified  Skin:  Reviewed, no issues  Last BM:  2/26   Height:   Ht Readings from Last 1 Encounters:  12/23/16 6' 1"  (1.854 m)    Weight:   Wt Readings from Last 1 Encounters:  01/03/17 172 lb 1.6 oz (78.1 kg)    Ideal Body Weight:  83.6 kg  BMI:  Body mass index is 22.71 kg/m.  Re-estimated Nutritional Needs:   Kcal:  2333  Protein:  125-140 gm  Fluid:  > 2 L/day  EDUCATION NEEDS:   No education needs identified at this time  Arthur Holms, RD, LDN Pager #: [phone] After-Hours Pager #: [phone]

## 2017-01-03 NOTE — Progress Notes (Signed)
Pharmacy Antibiotic Note  Troy Brennan is a 46 y.o. male continues on vancomycin for MRSA pneumonia. Now to add ceftazidime for sepsis due to continued fever.  Vanc D#7/7 for MRSA PNA. Still having fevers at 103.3, WBC down to 12.5 Adding ceftazidime today. Fevers could just be body response to stroke / cardiac arrest.  Plan: Continue vancomycin 750mg  IV Q8h (stop date 2/27) Start ceftazidime 1g IV Q8h  Monitor clinical picture, renal function, VT prn F/U C&S, abx deescalation / LOT  Height: 6\' 1"  (185.4 cm) Weight: 172 lb 1.6 oz (78.1 kg) IBW/kg (Calculated) : 79.9  Temp (24hrs), Avg:101.1 F (38.4 C), Min:99.6 F (37.6 C), Max:103.3 F (39.6 C)   Recent Labs Lab 12/28/16 0345 12/29/16 0245 12/30/16 0235 12/30/16 1125 12/31/16 0537 01/01/17 0333 01/02/17 0350 01/02/17 1934 01/03/17 0333  WBC 18.9* 13.6* 12.7*  --  12.7* 12.5*  --   --   --   CREATININE 1.03 0.88 0.88  --  0.98 1.08 1.24  --  1.07  VANCOTROUGH  --   --   --  18  --   --   --  17  --     Estimated Creatinine Clearance: 96.3 mL/min (by C-G formula based on SCr of 1.07 mg/dL).    No Known Allergies  Antimicrobials this admission: Tamiflu 2/14 >> 2/19 Vancomycin 2/14>>2/16, resumed 2/21 >> CTX 2/14>>2/15 Ceftaz 2/15>>2/22 Zosyn 2/14 x1  Dose adjustments this admission: 2/23 VT = 18 on 750mg  IV q8 (drawn ~ 30 minutes early, actual trough closer to 16)  Microbiology results:  2/14- influB 2/14 MRSA PCR- neg 2/14 resp- normal flora 2/14 urine-ng 2/14 blood x2-neg 2/20 TA MRSA  Thank you for allowing pharmacy to be a part of this patient's care.  Enzo Bi, PharmD, BCPS Clinical Pharmacist Pager [phone] 01/03/2017 1:59 PM

## 2017-01-04 ENCOUNTER — Inpatient Hospital Stay (HOSPITAL_COMMUNITY): Payer: Self-pay

## 2017-01-04 LAB — BASIC METABOLIC PANEL
ANION GAP: 9 (ref 5–15)
BUN: 30 mg/dL — ABNORMAL HIGH (ref 6–20)
CHLORIDE: 99 mmol/L — AB (ref 101–111)
CO2: 27 mmol/L (ref 22–32)
Calcium: 8.6 mg/dL — ABNORMAL LOW (ref 8.9–10.3)
Creatinine, Ser: 0.85 mg/dL (ref 0.61–1.24)
GFR calc Af Amer: >60 mL/min (ref >60)
GFR calc non Af Amer: >60 mL/min (ref >60)
Glucose, Bld: 91 mg/dL (ref 65–99)
POTASSIUM: 3.3 mmol/L — AB (ref 3.5–5.1)
SODIUM: 135 mmol/L (ref 135–145)

## 2017-01-04 LAB — GLUCOSE, CAPILLARY
GLUCOSE-CAPILLARY: 101 mg/dL — AB (ref 65–99)
GLUCOSE-CAPILLARY: 111 mg/dL — AB (ref 65–99)
GLUCOSE-CAPILLARY: 75 mg/dL (ref 65–99)
GLUCOSE-CAPILLARY: 83 mg/dL (ref 65–99)
GLUCOSE-CAPILLARY: 90 mg/dL (ref 65–99)
Glucose-Capillary: 101 mg/dL — ABNORMAL HIGH (ref 65–99)
Glucose-Capillary: 109 mg/dL — ABNORMAL HIGH (ref 65–99)

## 2017-01-04 LAB — LACTIC ACID, PLASMA: LACTIC ACID, VENOUS: 0.9 mmol/L (ref 0.5–1.9)

## 2017-01-04 LAB — PROCALCITONIN: PROCALCITONIN: 0.57 ng/mL

## 2017-01-04 MED ORDER — CLONAZEPAM 0.5 MG PO TABS
0.5000 mg | ORAL_TABLET | Freq: Two times a day (BID) | ORAL | Status: DC
  Administered 2017-01-04 – 2017-01-06 (×4): 0.5 mg
  Filled 2017-01-04 (×4): qty 1

## 2017-01-04 MED ORDER — VITAL AF 1.2 CAL PO LIQD
1000.0000 mL | ORAL | Status: DC
  Administered 2017-01-05 (×2): 1000 mL
  Filled 2017-01-04 (×3): qty 1000

## 2017-01-04 MED ORDER — FENTANYL 25 MCG/HR TD PT72
25.0000 ug | MEDICATED_PATCH | TRANSDERMAL | Status: DC
  Administered 2017-01-05: 25 ug via TRANSDERMAL
  Filled 2017-01-04: qty 1

## 2017-01-04 MED ORDER — QUETIAPINE FUMARATE 100 MG PO TABS
100.0000 mg | ORAL_TABLET | Freq: Two times a day (BID) | ORAL | Status: DC
  Administered 2017-01-04 – 2017-01-06 (×4): 100 mg
  Filled 2017-01-04 (×4): qty 1

## 2017-01-04 MED ORDER — VANCOMYCIN HCL IN DEXTROSE 750-5 MG/150ML-% IV SOLN
750.0000 mg | Freq: Three times a day (TID) | INTRAVENOUS | Status: AC
  Administered 2017-01-04 – 2017-01-06 (×7): 750 mg via INTRAVENOUS
  Filled 2017-01-04 (×7): qty 150

## 2017-01-04 MED ORDER — FENTANYL CITRATE (PF) 100 MCG/2ML IJ SOLN
25.0000 ug | INTRAMUSCULAR | Status: DC | PRN
  Administered 2017-01-06 – 2017-01-08 (×2): 50 ug via INTRAVENOUS
  Filled 2017-01-04 (×2): qty 2

## 2017-01-04 MED ORDER — POTASSIUM CHLORIDE 20 MEQ/15ML (10%) PO SOLN
40.0000 meq | Freq: Once | ORAL | Status: AC
  Administered 2017-01-04: 40 meq
  Filled 2017-01-04: qty 30

## 2017-01-04 NOTE — Progress Notes (Signed)
PULMONARY / CRITICAL CARE MEDICINE   Name: Troy Brennan MRN: [id number] DOB: 1971/08/05    ADMISSION DATE:  12/21/2016 CONSULTATION DATE:  12/21/2016  REFERRING MD:  Dr. Blinda Leatherwood   CHIEF COMPLAINT: Cardiac Arrest  BRIEF SUMMARY:  46 year old male with ETOH and polysubstance abuse, recently discharged from detox facility 8-10 days prior to admit. Found 2/14  in cardiac arrest, septic, normothermia, ARDS.  SUBJECTIVE:   NAD follows commands  VITAL SIGNS: BP 133/82   Pulse 82   Temp (!) 100.6 F (38.1 C) (Axillary)   Resp (!) 27   Ht 6\' 1"  (1.854 m)   Wt 164 lb 3.2 oz (74.5 kg)   SpO2 99%   BMI 21.66 kg/m   HEMODYNAMICS:    VENTILATOR SETTINGS: FiO2 (%):  [28 %] 28 %  INTAKE / OUTPUT:  Intake/Output Summary (Last 24 hours) at 01/04/17 0841 Last data filed at 01/04/17 0608  Gross per 24 hour  Intake            462.5 ml  Output             1650 ml  Net          -1187.5 ml   General:  Chronically ill appearing male in NAD, on t collar HEENT: MM pink/moist, #8 trach midline c/d/i, sutures intact, L eye opaque PSY: calm Neuro: Awake, tracks, follows 1 step commands  PULM: even/non-labored, lungs bilaterally coarse NO:MVEH, non-tender, bsx4 active TF Extremities: warm/dry, no edema  Skin: no rashes or lesions  LABS:  BMET  Recent Labs Lab 01/02/17 0350 01/03/17 0333 01/04/17 0421  NA 135 135 135  K 4.0 3.7 3.3*  CL 97* 97* 99*  CO2 27 28 27   BUN 41* 42* 30*  CREATININE 1.24 1.07 0.85  GLUCOSE 118* 111* 91    Electrolytes  Recent Labs Lab 12/31/16 0537 01/01/17 0333 01/02/17 0350 01/03/17 0333 01/04/17 0421  CALCIUM 8.6* 8.6* 8.8* 8.5* 8.6*  MG 1.6* 2.2  --   --   --   PHOS 3.6  --   --   --   --     CBC  Recent Labs Lab 12/30/16 0235 12/31/16 0537 01/01/17 0333 01/02/17 0902  WBC 12.7* 12.7* 12.5*  --   HGB 12.9* 13.3 13.0 13.8  HCT 40.0 41.0 40.5 42.0  PLT 124* 163 180  --     Coag's  Recent Labs Lab 12/29/16 0535   INR 1.15    Sepsis Markers No results for input(s): LATICACIDVEN, PROCALCITON, O2SATVEN in the last 168 hours.  ABG  Recent Labs Lab 12/28/16 1230  PHART 7.403  PCO2ART 49.3*  PO2ART 97.6    Liver Enzymes No results for input(s): AST, ALT, ALKPHOS, BILITOT, ALBUMIN in the last 168 hours.  Cardiac Enzymes No results for input(s): TROPONINI, PROBNP in the last 168 hours.  Glucose  Recent Labs Lab 01/03/17 0801 01/03/17 1723 01/03/17 2049 01/04/17 0046 01/04/17 0402 01/04/17 0755  GLUCAP 80 70 74 83 90 101*    Imaging No results found.  STUDIES/events:  CXR 2/14 >>  Diffuse bilateral airspace infiltrates, ? Edema, PNA, or Hemorrhagic Consolidation   CT Head 2/14 >> neg ECHO 2/15 >> 15-20 diffuse low EF Hepatitis panel >> negative MRI brain 2/20 >> Bilateral acute to early subacute ischemia within the basal ganglia. This pattern is most consistent with hypoxic ischemic injury, particularly in the context of recent cardiac arrest. Suspected petechial hemorrhage within both basal ganglia. No space-occupying hematoma. EEG  2/20 >> generalized slowing. Non-specific findings.  2/24 moved to step down / Triad primary  2/27 started on Fortaz for fever 102.1  CULTURES: Blood 2/14 >> neg Sputum 2/14 >> normal flora Urine 2/14 >> negative RVP 2/14 >> POS flu B Sputum 2/20 >> MRSA >> sens vanco 2/26 bc>> 2/28 sputum>> 2/28 procal>>  ANTIBIOTICS: Vancomycin 2/14 >> 2/16 Rocephin 2/14 >> 2/15 ceftaz 2/15 >> 2/22 Tamiflu 2/14 >> 2/19 Vanco 2/21 (MRSA sputum) >> 7 days planned  SIGNIFICANT EVENTS: 2/14  Presents to ED after being found pulseless/apneic at home 2/15  prone 2/17  supine, normothermia ongoing, PEEP / FiO2 better 2/19  Normoothermia protocol complete. Did not tolerated precedex d/t increased RR and tachycardia 2/20  Not following commands. Get MRI brain, getting EEG. Starting seroquel I low dose clonazepam.  2/21  Failed PSV. Family decided on trach.  DNR, started Vanc for SA PNA 2/22  Platelets for trach, trach at 1400 2/24  Transitioned to SDU 2/27 7 hours on t collar 2/28 24 hours on t collar  LINES/TUBES: ETT 2/14 >> 2/22 Trach (df) 2/22 >> 2/14 left IJ >> out 2 /14 a line rt rad >> out   Impression/plan  Resolved problems Septic shock Cardiac arrest Influenza B Aspiration PNA vs CAP (NOS) AKI Shock liver   Current problems  NEURO A: Anoxic Encephalopathy  Bilateral Basal Ganglial Stroke  P:   Continue fentanyl patch, 100 mcg Q4 + PRN Drop Klonopin 1mg  BID  Drop Seroquel 200 mg bID  (per IM) PT consult Mobilize as able  PULMONARY A: Acute hypoxic respiratory failure  HCAP (MRSA) Failure to Wean from Vent P: Trac care per protocol, #8 in place Discontinue trach sutures after 2/28  2/26 7 hours on t collar 2/27 plan 24 hours t collar. Suspect he will do well. 2/28 24 hours on t collar 2/28 check cxr  CARDIOVASCULAR A:  Acute systolic CM s/p Cardiac arrest EF 15-20% P:  tele  Per IM  DERM A: Dry gangrene changes of right hand Diffuse petechial rash P: Monitor  Per IM  RENAL  Recent Labs Lab 01/02/17 0350 01/03/17 0333 01/04/17 0421  NA 135 135 135   Lab Results  Component Value Date   CREATININE 0.85 01/04/2017   CREATININE 1.07 01/03/2017   CREATININE 1.24 01/02/2017    Recent Labs Lab 01/02/17 0350 01/03/17 0333 01/04/17 0421  K 4.0 3.7 3.3*     A: Hypernatremia -resolved Hypokalemia  Anasarca P: Per IM    INFECTIOUS A: HCAP (MRSA) 2/27 febrile - Vancomycine 2/21 >  -Elita Quick 2/27 >> P: Continue vancomycin / fortaz 2/28 check procal>>   PCCM to focus on weaning issues, hope to liberate him from vent. Tolerated t collar 2/26 for 7 hours, 2/27 plan for 24 hours, 2/28 off vent for 24 hours.  Brett Canales Minor ACNP [facility] PCCM Pager [phone] till 3 pm If no answer page [phone] 01/04/2017, 8:41 AM

## 2017-01-04 NOTE — Progress Notes (Signed)
Chart reviewed.   Discussed with primary attending.    At this time, patient is s/p recent trach, DNR code status, and has clear goal to continue with current care to see how progresses with rehab.  Palliative to sign off at this time, however, we would be happy to engage if we can be of further assistance in the care of Mr. Tingle moving forward.    Romie Minus, MD [facility] Palliative Medicine Team [phone]

## 2017-01-04 NOTE — Evaluation (Signed)
Passy-Muir Speaking Valve - Evaluation Patient Details  Name: Kyre Olshansky MRN: [id number] Date of Birth: Feb 01, 1971  Today's Date: 01/04/2017 Time: 2119-4174 SLP Time Calculation (min) (ACUTE ONLY): 18 min  Past Medical History: History reviewed. No pertinent past medical history. Past Surgical History: No past surgical history on file. HPI:  46 year old male with ETOH and polysubstance abuse, recently discharged from detox facility 8-10 days prior to admit. Found in cardiac arrest, septic, normothermia, ARDS. Found to ahve anoxic encephalopathy and bilateral basal ganglia stroke. Intubated from 2/14 to 2/22 when trach was placed. Pt currently with NG tube which has recently been dislodged concerning for aspiration of tube feed.    Assessment / Plan / Recommendation Clinical Impression  Pt demonstrates ability to redirect a portion of air to upper airway with PMSV placement, though signs of air trapping were evident after 4 minute intervals of placement. Pt able to communicate with hoarse, breathy phonation at phrase level. Barrier to toelrance were copious secretions that pt cleared with hard coughing following cuff deflation, but rose to mouth periodically with placement; also size of trach and presence of deflated cuff in airway. When appropriate pt will likely better tolerate PMSV with smaller, cuffless trach. Reported findings to RN and suggested dressing around stoma as there was concern for skin breakdown there. Pt to only wear pMSV with SLP at this time due to concern for intolerance. Will proceed with swallowing assessments in upcoming days.       SLP Assessment       Follow Up Recommendations  Skilled Nursing facility    Frequency and Duration min 2x/week  2 weeks    PMSV Trial PMSV was placed for: 4 minutes Able to redirect subglottic air through upper airway: Yes Able to Attain Phonation: Yes Voice Quality: Breathy;Hoarse;Low vocal intensity Able to Expectorate  Secretions: Yes Level of Secretion Expectoration with PMSV: Oral;Tracheal Breath Support for Phonation: Moderately decreased Intelligibility: Intelligibility reduced Word: 50-74% accurate Phrase: 50-74% accurate Sentence: 50-74% accurate Conversation: 50-74% accurate Respirations During Trial: 30 SpO2 During Trial: 97 %   Tracheostomy Tube  Additional Tracheostomy Tube Assessment Trach Collar Period: all waking hours Secretion Description: thick, copious Level of Secretion Expectoration: Tracheal    Vent Dependency  Vent Dependent: No FiO2 (%): 28 %    Cuff Deflation Trial  GO Tolerated Cuff Deflation: Yes Length of Time for Cuff Deflation Trial: 20 minutes Behavior: Alert;Cooperative        Deshauna Cayson, Riley Nearing 01/04/2017, 2:19 PM

## 2017-01-04 NOTE — Progress Notes (Signed)
Rehab admissions - I am following for potential acute inpatient rehab admission.  Note rehab MD recommending LTACH.  I will follow up tomorrow with patient for potential care giver support and appropriate rehab venue.  Call me for questions.  #[phone]

## 2017-01-04 NOTE — Progress Notes (Signed)
[facility] TEAM 1 - Stepdown/ICU TEAM  Farron Watrous Novicki  MHD:[id number] DOB: [birth year] DOA: 12/21/2016 PCP: No PCP Per Patient    Brief Narrative:  46 year old male with ETOH and polysubstance abuse, recently discharged from detox facility 8-10 days prior to admit, who was found in cardiac arrest on 12/21/16. ROSC was achieved about 15 minutes later. He was admitted to the ICU and treated for influenza B w/ superimposed bacterial pneumonia along with ARDS.  He required prone ventilation and chemical paralytics. Lurline Idol was placed by PCCM on 12/29/2016.  He was transferred to the stepdown unit on 12/31/2016.  Subjective: Pt is more alert today.  He is experiencing signif secretions via his trach.  He denies uncontrolled pain.  Assessment & Plan:  Acute Hypoxic Resp Failure - Influenza B + PNA + ARDS PCCM directing care of vent support and trach - appears to be stabilizing  MRSA PNA  PCCM suggesting 10 days of Vanc  Anoxic Encephalopathy  Steadily Improving - minimize sedatives  ?Ileus v/s SBO Improving clinically - advance tube feed to goal of 21JH/ER  Acute Systolic CHF s/p Cardiac arrest  EF 15-20% - no evidence of signif volume overload at this time - follow Is/Os - wgt stable Filed Weights   01/02/17 0822 01/03/17 0454 01/04/17 0415  Weight: 77.2 kg (170 lb 4.8 oz) 78.1 kg (172 lb 1.6 oz) 74.5 kg (164 lb 3.2 oz)    Dry gangrene R hand  Remains dry on exam w/o evidence of superinfection at this time - discussed possibility of loosing parts of his fingers w/ pt and his girlfriend  Thrombocytopenia due to severe acute illness - recheck in AM   Hypernatremia  Resolved - slow free water   DVT prophylaxis: SCDs Code Status: DNR - NO CODE  Family Communication: spoke w/ girlfriend at bedside  Disposition Plan: SDU  Consultants:  PCCM Neurology   Procedures: 2/22 Trach  Antimicrobials:  Zosyn > Rocephin > Ceftaz 2/13 > 2/21 Vanc 2/14 > 2/16 + 2/21 >     Objective: Blood pressure 115/82, pulse 76, temperature (!) 100.6 F (38.1 C), temperature source Axillary, resp. rate 20, height 6' 1"  (1.854 m), weight 74.5 kg (164 lb 3.2 oz), SpO2 96 %.  Intake/Output Summary (Last 24 hours) at 01/04/17 1639 Last data filed at 01/04/17 7408  Gross per 24 hour  Intake            462.5 ml  Output             1350 ml  Net           -887.5 ml   Filed Weights   01/02/17 0822 01/03/17 0454 01/04/17 0415  Weight: 77.2 kg (170 lb 4.8 oz) 78.1 kg (172 lb 1.6 oz) 74.5 kg (164 lb 3.2 oz)    Examination: General: No acute respiratory distress - more alert  Lungs: Clear to auscultation bilaterally without wheeze - copious trach secretions  Cardiovascular: Regular rate and rhythm without murmur gallop or rub  Abdomen: Nontender, nondistended, soft, bowel sounds positive, no rebound, no ascites, no appreciable mass Extremities: No significant cyanosis, clubbing, or edema bilateral lower extremities  CBC:  Recent Labs Lab 12/29/16 0245 12/30/16 0235 12/31/16 0537 01/01/17 0333 01/02/17 0902  WBC 13.6* 12.7* 12.7* 12.5*  --   NEUTROABS  --   --  10.3*  --   --   HGB 11.9* 12.9* 13.3 13.0 13.8  HCT 38.5* 40.0 41.0 40.5 42.0  MCV 89.7 88.5  87.0 86.9  --   PLT 75* 124* 163 180  --    Basic Metabolic Panel:  Recent Labs Lab 12/31/16 0537 01/01/17 0333 01/02/17 0350 01/03/17 0333 01/04/17 0421  NA 140 138 135 135 135  K 4.0 3.6 4.0 3.7 3.3*  CL 104 100* 97* 97* 99*  CO2 27 28 27 28 27   GLUCOSE 138* 117* 118* 111* 91  BUN 40* 44* 41* 42* 30*  CREATININE 0.98 1.08 1.24 1.07 0.85  CALCIUM 8.6* 8.6* 8.8* 8.5* 8.6*  MG 1.6* 2.2  --   --   --   PHOS 3.6  --   --   --   --    GFR: Estimated Creatinine Clearance: 115.6 mL/min (by C-G formula based on SCr of 0.85 mg/dL).  Liver Function Tests: No results for input(s): AST, ALT, ALKPHOS, BILITOT, PROT, ALBUMIN in the last 168 hours.  Coagulation Profile:  Recent Labs Lab 12/29/16 0535   INR 1.15   CBG:  Recent Labs Lab 01/04/17 0046 01/04/17 0402 01/04/17 0755 01/04/17 1307 01/04/17 1633  GLUCAP 83 90 101* 101* 75    Recent Results (from the past 240 hour(s))  Culture, respiratory (NON-Expectorated)     Status: None   Collection Time: 12/27/16  7:50 AM  Result Value Ref Range Status   Specimen Description TRACHEAL ASPIRATE  Final   Special Requests NONE  Final   Gram Stain   Final    FEW WBC PRESENT, PREDOMINANTLY PMN RARE SQUAMOUS EPITHELIAL CELLS PRESENT FEW GRAM POSITIVE COCCI IN PAIRS    Culture FEW METHICILLIN RESISTANT STAPHYLOCOCCUS AUREUS  Final   Report Status 12/29/2016 FINAL  Final   Organism ID, Bacteria METHICILLIN RESISTANT STAPHYLOCOCCUS AUREUS  Final      Susceptibility   Methicillin resistant staphylococcus aureus - MIC*    CIPROFLOXACIN <=0.5 SENSITIVE Sensitive     ERYTHROMYCIN <=0.25 SENSITIVE Sensitive     GENTAMICIN <=0.5 SENSITIVE Sensitive     OXACILLIN >=4 RESISTANT Resistant     TETRACYCLINE <=1 SENSITIVE Sensitive     VANCOMYCIN 1 SENSITIVE Sensitive     TRIMETH/SULFA <=10 SENSITIVE Sensitive     CLINDAMYCIN <=0.25 SENSITIVE Sensitive     RIFAMPIN <=0.5 SENSITIVE Sensitive     Inducible Clindamycin NEGATIVE Sensitive     * FEW METHICILLIN RESISTANT STAPHYLOCOCCUS AUREUS  Culture, blood (routine x 2)     Status: None (Preliminary result)   Collection Time: 01/02/17  6:01 AM  Result Value Ref Range Status   Specimen Description BLOOD BLOOD LEFT HAND  Final   Special Requests IN PEDIATRIC BOTTLE 1CC  Final   Culture NO GROWTH 2 DAYS  Final   Report Status PENDING  Incomplete  Culture, blood (routine x 2)     Status: None (Preliminary result)   Collection Time: 01/02/17  6:03 AM  Result Value Ref Range Status   Specimen Description BLOOD BLOOD RIGHT HAND  Final   Special Requests IN PEDIATRIC BOTTLE 1CC  Final   Culture NO GROWTH 2 DAYS  Final   Report Status PENDING  Incomplete  Culture, respiratory (NON-Expectorated)      Status: None (Preliminary result)   Collection Time: 01/04/17  9:40 AM  Result Value Ref Range Status   Specimen Description TRACHEAL ASPIRATE  Final   Special Requests NONE  Final   Gram Stain   Final    MODERATE WBC PRESENT, PREDOMINANTLY PMN RARE SQUAMOUS EPITHELIAL CELLS PRESENT MODERATE GRAM NEGATIVE RODS FEW GRAM POSITIVE COCCI IN PAIRS  Culture PENDING  Incomplete   Report Status PENDING  Incomplete     Scheduled Meds: . aspirin  81 mg Per Tube Daily  . chlorhexidine gluconate (MEDLINE KIT)  15 mL Mouth Rinse BID  . clonazePAM  0.5 mg Per Tube BID  . feeding supplement (VITAL AF 1.2 CAL)  1,000 mL Per Tube Q24H  . [START ON 01/05/2017] fentaNYL  50 mcg Transdermal Q72H  . mouth rinse  15 mL Mouth Rinse 10 times per day  . metoprolol tartrate  50 mg Per Tube BID  . pantoprazole sodium  40 mg Per Tube QHS  . potassium chloride  40 mEq Per Tube Once  . QUEtiapine  100 mg Per Tube BID   Continuous Infusions: . dextrose 50 mL/hr at 01/04/17 3943     LOS: 14 days   Cherene Altes, MD [facility] Office  [phone] Pager - Text Page per Shea Evans as per below:  On-Call/Text Page:      [email or web address]      password [email or web address]  If 7PM-7AM, please contact night-coverage [email or web address] Password [email or web address] 01/04/2017, 4:39 PM

## 2017-01-04 NOTE — Progress Notes (Signed)
Pharmacy Antibiotic Note  Troy Brennan is a 46 y.o. male previously on vancomycin for MRSA pneumonia.  Vanc was stopped 2/28 on D#7/7 for MRSA PNA. Ceftazidime added 2/27 and now stopped on 2/28.  MD desires a total of 10 days of vancomycin as per PCCM recs.  Creat WNL, significant secretions  Plan: resume vancomycin 750mg  IV Q8h (stop date 3/2 at 1800) Monitor clinical picture, renal function  Height: 6\' 1"  (185.4 cm) Weight: 164 lb 3.2 oz (74.5 kg) IBW/kg (Calculated) : 79.9  Temp (24hrs), Avg:101.1 F (38.4 C), Min:100.2 F (37.9 C), Max:102.1 F (38.9 C)   Recent Labs Lab 12/29/16 0245 12/30/16 0235 12/30/16 1125 12/31/16 0537 01/01/17 0333 01/02/17 0350 01/02/17 1934 01/03/17 0333 01/04/17 0421 01/04/17 1030  WBC 13.6* 12.7*  --  12.7* 12.5*  --   --   --   --   --   CREATININE 0.88 0.88  --  0.98 1.08 1.24  --  1.07 0.85  --   LATICACIDVEN  --   --   --   --   --   --   --   --   --  0.9  VANCOTROUGH  --   --  18  --   --   --  17  --   --   --     Estimated Creatinine Clearance: 115.6 mL/min (by C-G formula based on SCr of 0.85 mg/dL).    No Known Allergies  Antimicrobials this admission: Tamiflu 2/14 >> 2/19 Vancomycin 2/14>>2/16, resumed 2/21 >>2.27,  2/22>> CTX 2/14>>2/15 Ceftaz 2/15>>2/22 Zosyn 2/14 x1  Dose adjustments this admission: 2/23 VT = 18 on 750mg  IV q8 (drawn ~ 30 minutes early, actual trough closer to 16)  Microbiology results:  2/14- influB 2/14 MRSA PCR- neg 2/14 resp- normal flora 2/14 urine-ng 2/14 blood x2-neg 2/20 TA MRSA  Thank you for allowing pharmacy to be a part of this patient's care.  Herby Abraham, Pharm.D. [phone] 01/04/2017 5:42 PM

## 2017-01-04 NOTE — Progress Notes (Signed)
Physical Therapy Treatment Patient Details Name: Troy Brennan MRN: [id number] DOB: 18-Apr-1971 Today's Date: 01/04/2017    History of Present Illness 46 year old male with ETOH and polysubstance abuse, recently discharged from detox facility 8-10 days prior to admit. Found in cardiac arrest, septic, normothermia, ARDS.    PT Comments    Pt presented supine in bed with HOB elevated, asleep. Therapist attempted variety of techniques to stimulate pt including, auditory, tactile, movement and pain. Pt not responsive to any stimuli. However, pt needing repositioning in bed with total A x2. All VSS throughout. PT continuing to recommend pt d/c to CIR in hopes that pt's arousal level and tolerance to therapy will improve as he would greatly benefit from further intensive therapy services. PT will follow pt acutely.    Follow Up Recommendations  CIR;Supervision/Assistance - 24 hour     Equipment Recommendations  None recommended by PT;Other (comment) (TBD based on progression of mobility)    Recommendations for Other Services OT consult;Rehab consult     Precautions / Restrictions Precautions Precautions: Fall Precaution Comments: NG tube, trach, peg Restrictions Weight Bearing Restrictions: No    Mobility  Bed Mobility Overal bed mobility: Needs Assistance Bed Mobility: Rolling Rolling: Total assist;+2 for physical assistance         General bed mobility comments: total A x2 for rolling and bed mobility for positioning  Transfers                 General transfer comment: unable to arouse pt enough to participate in transfers  Ambulation/Gait                 Stairs            Wheelchair Mobility    Modified Rankin (Stroke Patients Only)       Balance                                    Cognition Arousal/Alertness: Lethargic Behavior During Therapy: Flat affect Overall Cognitive Status: Difficult to assess                       Exercises      General Comments General comments (skin integrity, edema, etc.): PROM performed at bilateral UEs and LEs in an attempt to arouse pt along with multiple auditory and tactile stimuli. Pt did not arouse; however, needed repositioning in bed with multiple lines underneath his bottom and bed pads straightened out to prevent skin deterioration.      Pertinent Vitals/Pain Pain Assessment: Faces Faces Pain Scale: No hurt    Home Living                      Prior Function            PT Goals (current goals can now be found in the care plan section) Acute Rehab PT Goals PT Goal Formulation: Patient unable to participate in goal setting Time For Goal Achievement: 01/16/17 Potential to Achieve Goals: Fair    Frequency    Min 3X/week      PT Plan Current plan remains appropriate    Co-evaluation             End of Session Equipment Utilized During Treatment: Oxygen (28% trach collar) Activity Tolerance: Patient limited by lethargy Patient left: in bed;with call bell/phone within reach;with bed alarm set Nurse Communication:  Mobility status PT Visit Diagnosis: Other symptoms and signs involving the nervous system (Z61.096)     Time: 0454-0981 PT Time Calculation (min) (ACUTE ONLY): 14 min  Charges:  $Therapeutic Activity: 8-22 mins                    G CodesAlessandra Bevels Ezmae Speers 01-31-17, 11:00 AM Deborah Chalk, PT, DPT [phone]

## 2017-01-05 ENCOUNTER — Inpatient Hospital Stay (HOSPITAL_COMMUNITY): Payer: Self-pay

## 2017-01-05 LAB — COMPREHENSIVE METABOLIC PANEL
ALK PHOS: 54 U/L (ref 38–126)
ALT: 47 U/L (ref 17–63)
ANION GAP: 8 (ref 5–15)
AST: 44 U/L — ABNORMAL HIGH (ref 15–41)
Albumin: 2.2 g/dL — ABNORMAL LOW (ref 3.5–5.0)
BUN: 27 mg/dL — ABNORMAL HIGH (ref 6–20)
CALCIUM: 8.3 mg/dL — AB (ref 8.9–10.3)
CHLORIDE: 101 mmol/L (ref 101–111)
CO2: 27 mmol/L (ref 22–32)
Creatinine, Ser: 0.93 mg/dL (ref 0.61–1.24)
Glucose, Bld: 115 mg/dL — ABNORMAL HIGH (ref 65–99)
Potassium: 3 mmol/L — ABNORMAL LOW (ref 3.5–5.1)
SODIUM: 136 mmol/L (ref 135–145)
Total Bilirubin: 1.5 mg/dL — ABNORMAL HIGH (ref 0.3–1.2)
Total Protein: 6.9 g/dL (ref 6.5–8.1)

## 2017-01-05 LAB — CBC
HCT: 37.1 % — ABNORMAL LOW (ref 39.0–52.0)
HEMOGLOBIN: 12 g/dL — AB (ref 13.0–17.0)
MCH: 28 pg (ref 26.0–34.0)
MCHC: 32.3 g/dL (ref 30.0–36.0)
MCV: 86.5 fL (ref 78.0–100.0)
Platelets: 247 10*3/uL (ref 150–400)
RBC: 4.29 MIL/uL (ref 4.22–5.81)
RDW: 12.6 % (ref 11.5–15.5)
WBC: 8.1 10*3/uL (ref 4.0–10.5)

## 2017-01-05 LAB — GLUCOSE, CAPILLARY
GLUCOSE-CAPILLARY: 109 mg/dL — AB (ref 65–99)
GLUCOSE-CAPILLARY: 113 mg/dL — AB (ref 65–99)
GLUCOSE-CAPILLARY: 98 mg/dL (ref 65–99)
Glucose-Capillary: 93 mg/dL (ref 65–99)
Glucose-Capillary: 109 mg/dL — ABNORMAL HIGH (ref 65–99)

## 2017-01-05 MED ORDER — SODIUM CHLORIDE 0.9 % IV SOLN
30.0000 meq | Freq: Once | INTRAVENOUS | Status: AC
  Administered 2017-01-05: 30 meq via INTRAVENOUS
  Filled 2017-01-05: qty 15

## 2017-01-05 NOTE — Progress Notes (Addendum)
  Speech Language Pathology Treatment: [name] Speaking valve  Patient Details Name: Troy Brennan MRN: [id number] DOB: 1971-06-04 Today's Date: 01/05/2017 Time: 0850-0910 SLP Time Calculation (min) (ACUTE ONLY): 20 min  Assessment / Plan / Recommendation Clinical Impression  Patient seen for PMSV treatment, with dressing applied to stoma. He demonstrates ability to redirect a portion of air to upper airway with PMSV placement, though continues to have signs of air trapping after 4 minute intervals of placement. Communicates at phrase and sentence level with hoarse, breathy voice. SLP provided min-moderate verbal cues for redirection of air through upper airway, deep breaths to improve phonation. He continues to have thick, copious secretions which do appear to have improved since intitial evaluation. Suspect ability to manage and expectorate secretions, and tolerance of PMSV would improve with smaller size trach. Reported findings to RN and MD who is considering downsizing. Pt to only wear PMSV with SLP due to concern for intolerance. SLP will f/u.   HPI HPI: 46 year old male with ETOH and polysubstance abuse, recently discharged from detox facility 8-10 days prior to admit. Found in cardiac arrest, septic, normothermia, ARDS. Found to ahve anoxic encephalopathy and bilateral basal ganglia stroke. Intubated from 2/14 to 2/22 when trach was placed. Pt currently with NG tube which has recently been dislodged concerning for aspiration of tube feed.       SLP Plan  Continue with current plan of care;Other (Comment) (FEES )       Recommendations  Diet recommendations: NPO      Patient may use Passy-Muir Speech Valve: with SLP only PMSV Supervision: Full MD: Please consider changing trach tube to : Smaller size;Cuffless         Oral Care Recommendations: Oral care QID Follow up Recommendations: Skilled Nursing facility SLP Visit Diagnosis: Aphonia (R49.1) Plan: Continue with  current plan of care;Other (Comment) (FEES )       GO                Arlana Lindau 01/05/2017, 9:22 AM  Rondel Baton, MS CF-SLP Speech-Language Pathologist [phone]

## 2017-01-05 NOTE — Progress Notes (Signed)
Patient has been placed back into safety mittens. He has pulled out his IV and his condom catheter off. Will trial the mittens but may need to talk to MD about wrist restraints if patient continues to pull lines and interfere with treatment.

## 2017-01-05 NOTE — Progress Notes (Signed)
PROGRESS NOTE                                                                                                                                                                                                             Patient Demographics:    Troy Brennan, is a 46 y.o. male, DOB - [birth year], ASN:[id number]  Admit date - 12/21/2016   Admitting Physician Raylene Miyamoto, MD  Outpatient Primary MD for the patient is No PCP Per Patient  LOS - 15  Outpatient Specialists:none  Chief Complaint  Patient presents with  . Cardiac Arrest       Brief Narrative   46 year old male with ETOH and polysubstance abuse, recently discharged from detox facility 8-10 days prior to admit, who was found in cardiac arrest on 12/21/16. ROSCwas achieved about 15 minutes later. He was admitted to the ICU and treated for influenza B w/ superimposed bacterial pneumonia along with ARDS.  He required prone ventilation and chemical paralytics. Lurline Idol was placed by PCCM on 12/29/2016.  He was transferred to the stepdown unit on 12/31/2016.    Subjective:   More alert and less agitated. Secretion seems to be improving.   Assessment  & Plan :    Acute Hypoxic Resp Failure - Influenza B + PNA + ARDS PCCM directing care of vent support and trach . tolerates Passy-Muir valve under supervision . Downsized tracheostomy to cuffless 6. Plan on FEES tomorrow.  MRSA PNA  Treating with 10 days of vancomycin (stopped it 2/2)  Anoxic Encephalopathy  Improving - minimize sedatives. Taper Seroquel and Klonopin.  ?Ileus v/s SBO Improving clinically - advance tube feed to goal   Acute Systolic CHF s/p Cardiac arrest  EF 15-20% -  euvolemic.  Dry gangrene R hand  No evidence of superinfection. Was discussed possibility of loosing parts of his fingers w/ pt and his girlfriend.  Thrombocytopenia due to severe acute illness.  Improved.  Hypernatremia  Resolved - slow free water    hypokalemia replenish      Code Status : full code  Family Communication  : None at bedside  Disposition Plan  : Continue step down monitoring. May not qualify for LTAC due to insurance status. Will need SNF.  Barriers For Discharge : Active symptoms  Consultants:  PCCM Neurology   Procedures: 2/22 Lurline Idol  Antimicrobials:  Zosyn > Rocephin > Ceftaz 2/13 > 2/21 Vanc 2/14 > 2/16 + 2/21 >  (until 3/2)  DVT Prophylaxis  :  Lovenox  Lab Results  Component Value Date   PLT 247 01/05/2017    Antibiotics  :    Anti-infectives    Start     Dose/Rate Route Frequency Ordered Stop   01/04/17 1800  vancomycin (VANCOCIN) IVPB 750 mg/150 ml premix     750 mg 150 mL/hr over 60 Minutes Intravenous Every 8 hours 01/04/17 1739 01/07/17 0159   01/03/17 1430  cefTAZidime (FORTAZ) 1 g in dextrose 5 % 50 mL IVPB  Status:  Discontinued     1 g 100 mL/hr over 30 Minutes Intravenous Every 8 hours 01/03/17 1359 01/04/17 1555   12/28/16 2030  vancomycin (VANCOCIN) IVPB 750 mg/150 ml premix     750 mg 150 mL/hr over 60 Minutes Intravenous Every 8 hours 12/28/16 1127 01/03/17 2230   12/28/16 1230  vancomycin (VANCOCIN) 1,500 mg in sodium chloride 0.9 % 500 mL IVPB     1,500 mg 250 mL/hr over 120 Minutes Intravenous  Once 12/28/16 1127 12/28/16 1448   12/23/16 1000  oseltamivir (TAMIFLU) 6 MG/ML suspension 75 mg  Status:  Discontinued     75 mg Per Tube 2 times daily 12/23/16 0928 12/26/16 1030   12/22/16 0900  cefTAZidime (FORTAZ) 1 g in dextrose 5 % 50 mL IVPB  Status:  Discontinued     1 g 100 mL/hr over 30 Minutes Intravenous Every 8 hours 12/22/16 0857 12/29/16 0923   12/22/16 0400  vancomycin (VANCOCIN) 1,250 mg in sodium chloride 0.9 % 250 mL IVPB  Status:  Discontinued     1,250 mg 166.7 mL/hr over 90 Minutes Intravenous Every 24 hours 12/21/16 0738 12/23/16 0839   12/21/16 1300  piperacillin-tazobactam (ZOSYN) IVPB  3.375 g  Status:  Discontinued     3.375 g 12.5 mL/hr over 240 Minutes Intravenous Every 8 hours 12/21/16 0738 12/21/16 0753   12/21/16 1000  oseltamivir (TAMIFLU) 6 MG/ML suspension 75 mg  Status:  Discontinued     75 mg Per Tube Daily 12/21/16 0802 12/23/16 0928   12/21/16 0800  cefTRIAXone (ROCEPHIN) 2 g in dextrose 5 % 50 mL IVPB  Status:  Discontinued     2 g 100 mL/hr over 30 Minutes Intravenous Every 24 hours 12/21/16 0754 12/22/16 0857   12/21/16 0615  piperacillin-tazobactam (ZOSYN) IVPB 3.375 g     3.375 g 100 mL/hr over 30 Minutes Intravenous  Once 12/21/16 0603 12/21/16 0648   12/21/16 0615  vancomycin (VANCOCIN) IVPB 1000 mg/200 mL premix     1,000 mg 200 mL/hr over 60 Minutes Intravenous  Once 12/21/16 0603 12/21/16 0746        Objective:   Vitals:   01/05/17 0800 01/05/17 0830 01/05/17 1234 01/05/17 1543  BP: 112/86 112/86 127/81   Pulse: 89 84    Resp: (!) 27 (!) 24 (!) 26   Temp: 99.4 F (37.4 C)  (!) 100.9 F (38.3 C)   TempSrc: Oral  Rectal   SpO2: 96% 100% 100% 100%  Weight:      Height:        Wt Readings from Last 3 Encounters:  01/05/17 72.9 kg (160 lb 11.5 oz)     Intake/Output Summary (Last 24 hours) at 01/05/17 1612 Last data filed at 01/05/17 0600  Gross per 24 hour  Intake  1460 ml  Output             1550 ml  Net              -90 ml     Physical Exam  Gen: not in distress, Awake HEENT:, moist mucosa, , NG in place, trached Chest: clear b/l, no added sounds CVS: N S1&S2, no murmurs,  GI: soft, NT, ND, BS+ Musculoskeletal: warm, no edema, dry gangrene of rt hand    Data Review:    CBC  Recent Labs Lab 12/30/16 0235 12/31/16 0537 01/01/17 0333 01/02/17 0902 01/05/17 0219  WBC 12.7* 12.7* 12.5*  --  8.1  HGB 12.9* 13.3 13.0 13.8 12.0*  HCT 40.0 41.0 40.5 42.0 37.1*  PLT 124* 163 180  --  247  MCV 88.5 87.0 86.9  --  86.5  MCH 28.5 28.2 27.9  --  28.0  MCHC 32.3 32.4 32.1  --  32.3  RDW 14.1 13.7 13.5  --   12.6  LYMPHSABS  --  1.5  --   --   --   MONOABS  --  0.8  --   --   --   EOSABS  --  0.1  --   --   --   BASOSABS  --  0.0  --   --   --     Chemistries   Recent Labs Lab 12/31/16 0537 01/01/17 0333 01/02/17 0350 01/03/17 0333 01/04/17 0421 01/05/17 0219  NA 140 138 135 135 135 136  K 4.0 3.6 4.0 3.7 3.3* 3.0*  CL 104 100* 97* 97* 99* 101  CO2 27 28 27 28 27 27   GLUCOSE 138* 117* 118* 111* 91 115*  BUN 40* 44* 41* 42* 30* 27*  CREATININE 0.98 1.08 1.24 1.07 0.85 0.93  CALCIUM 8.6* 8.6* 8.8* 8.5* 8.6* 8.3*  MG 1.6* 2.2  --   --   --   --   AST  --   --   --   --   --  44*  ALT  --   --   --   --   --  47  ALKPHOS  --   --   --   --   --  54  BILITOT  --   --   --   --   --  1.5*   ------------------------------------------------------------------------------------------------------------------ No results for input(s): CHOL, HDL, LDLCALC, TRIG, CHOLHDL, LDLDIRECT in the last 72 hours.  No results found for: HGBA1C ------------------------------------------------------------------------------------------------------------------ No results for input(s): TSH, T4TOTAL, T3FREE, THYROIDAB in the last 72 hours.  Invalid input(s): FREET3 ------------------------------------------------------------------------------------------------------------------ No results for input(s): VITAMINB12, FOLATE, FERRITIN, TIBC, IRON, RETICCTPCT in the last 72 hours.  Coagulation profile No results for input(s): INR, PROTIME in the last 168 hours.  No results for input(s): DDIMER in the last 72 hours.  Cardiac Enzymes No results for input(s): CKMB, TROPONINI, MYOGLOBIN in the last 168 hours.  Invalid input(s): CK ------------------------------------------------------------------------------------------------------------------    Component Value Date/Time   BNP 57.9 12/21/2016 0557    Inpatient Medications  Scheduled Meds: . aspirin  81 mg Per Tube Daily  . chlorhexidine gluconate  (MEDLINE KIT)  15 mL Mouth Rinse BID  . clonazePAM  0.5 mg Per Tube BID  . feeding supplement (VITAL AF 1.2 CAL)  1,000 mL Per Tube Q24H  . fentaNYL  25 mcg Transdermal Q72H  . mouth rinse  15 mL Mouth Rinse 10 times per day  . metoprolol tartrate  50  mg Per Tube BID  . pantoprazole sodium  40 mg Per Tube QHS  . QUEtiapine  100 mg Per Tube BID  . vancomycin  750 mg Intravenous Q8H   Continuous Infusions: . dextrose 10 mL (01/04/17 1726)   PRN Meds:.acetaminophen (TYLENOL) oral liquid 160 mg/5 mL, acetaminophen, fentaNYL (SUBLIMAZE) injection, Influenza vac split quadrivalent PF, pneumococcal 23 valent vaccine  Micro Results Recent Results (from the past 240 hour(s))  Culture, respiratory (NON-Expectorated)     Status: None   Collection Time: 12/27/16  7:50 AM  Result Value Ref Range Status   Specimen Description TRACHEAL ASPIRATE  Final   Special Requests NONE  Final   Gram Stain   Final    FEW WBC PRESENT, PREDOMINANTLY PMN RARE SQUAMOUS EPITHELIAL CELLS PRESENT FEW GRAM POSITIVE COCCI IN PAIRS    Culture FEW METHICILLIN RESISTANT STAPHYLOCOCCUS AUREUS  Final   Report Status 12/29/2016 FINAL  Final   Organism ID, Bacteria METHICILLIN RESISTANT STAPHYLOCOCCUS AUREUS  Final      Susceptibility   Methicillin resistant staphylococcus aureus - MIC*    CIPROFLOXACIN <=0.5 SENSITIVE Sensitive     ERYTHROMYCIN <=0.25 SENSITIVE Sensitive     GENTAMICIN <=0.5 SENSITIVE Sensitive     OXACILLIN >=4 RESISTANT Resistant     TETRACYCLINE <=1 SENSITIVE Sensitive     VANCOMYCIN 1 SENSITIVE Sensitive     TRIMETH/SULFA <=10 SENSITIVE Sensitive     CLINDAMYCIN <=0.25 SENSITIVE Sensitive     RIFAMPIN <=0.5 SENSITIVE Sensitive     Inducible Clindamycin NEGATIVE Sensitive     * FEW METHICILLIN RESISTANT STAPHYLOCOCCUS AUREUS  Culture, blood (routine x 2)     Status: None (Preliminary result)   Collection Time: 01/02/17  6:01 AM  Result Value Ref Range Status   Specimen Description BLOOD BLOOD  LEFT HAND  Final   Special Requests IN PEDIATRIC BOTTLE 1CC  Final   Culture NO GROWTH 3 DAYS  Final   Report Status PENDING  Incomplete  Culture, blood (routine x 2)     Status: None (Preliminary result)   Collection Time: 01/02/17  6:03 AM  Result Value Ref Range Status   Specimen Description BLOOD BLOOD RIGHT HAND  Final   Special Requests IN PEDIATRIC BOTTLE 1CC  Final   Culture NO GROWTH 3 DAYS  Final   Report Status PENDING  Incomplete  Culture, respiratory (NON-Expectorated)     Status: None (Preliminary result)   Collection Time: 01/04/17  9:40 AM  Result Value Ref Range Status   Specimen Description TRACHEAL ASPIRATE  Final   Special Requests NONE  Final   Gram Stain   Final    MODERATE WBC PRESENT, PREDOMINANTLY PMN RARE SQUAMOUS EPITHELIAL CELLS PRESENT MODERATE GRAM NEGATIVE RODS FEW GRAM POSITIVE COCCI IN PAIRS    Culture   Final    MODERATE STAPHYLOCOCCUS AUREUS SUSCEPTIBILITIES TO FOLLOW    Report Status PENDING  Incomplete    Radiology Reports Dg Abd 1 View  Result Date: 01/01/2017 CLINICAL DATA:  Nasal/orogastric tube placement. EXAM: ABDOMEN - 1 VIEW COMPARISON:  01/01/2017 at 12:33 p.m. FINDINGS: The tip of the nasal/orogastric tube extends into the proximal stomach. Next item there is persistent small bowel dilation consistent with a partial obstruction, unchanged from the earlier exam. IMPRESSION: Nasal/orogastric tube tip projects in the proximal stomach. Electronically Signed   By: Lajean Manes M.D.   On: 01/01/2017 18:22   Ct Head Wo Contrast  Result Date: 12/21/2016 CLINICAL DATA:  Altered mental status, cardiac arrest, resuscitation, substance abuse,  alcohol abuse EXAM: CT HEAD WITHOUT CONTRAST TECHNIQUE: Contiguous axial images were obtained from the base of the skull through the vertex without intravenous contrast. COMPARISON:  None available FINDINGS: Brain: No evidence of acute infarction, hemorrhage, hydrocephalus, extra-axial collection or mass  lesion/mass effect. Vascular: No hyperdense vessel or unexpected calcification. Skull: Normal. Negative for fracture or focal lesion. Sinuses/Orbits: No acute finding. Other: None. IMPRESSION: Normal head CT without contrast Electronically Signed   By: Jerilynn Mages.  Shick M.D.   On: 12/21/2016 08:58   Mr Jeri Cos EM Contrast  Result Date: 12/28/2016 CLINICAL DATA:  Acute encephalopathy. Alcohol and polysubstance abuse. Recently discharged from detox facility. Status post cardiac arrest. EXAM: MRI HEAD WITHOUT AND WITH CONTRAST TECHNIQUE: Multiplanar, multiecho pulse sequences of the brain and surrounding structures were obtained without and with intravenous contrast. CONTRAST:  56m MULTIHANCE GADOBENATE DIMEGLUMINE 529 MG/ML IV SOLN COMPARISON:  Head CT 12/21/2016 FINDINGS: Brain: There is bilateral diffusion restriction within the basal ganglia. No other sites of abnormal diffusion are identified. There is associated bilateral basal ganglia hyperintense T2 weighted signal. The remainder of the brain parenchymal signal is normal. There is mild susceptibility within both basal ganglia. No mass lesion or midline shift. No hydrocephalus or extra-axial fluid collection. The midline structures are normal. No age advanced or lobar predominant atrophy. There is faint contrast enhancement at the sites of ischemia. Vascular: Major intracranial arterial and venous sinus flow voids are preserved. No evidence of chronic microhemorrhage or amyloid angiopathy. Skull and upper cervical spine: The visualized skull base, calvarium, upper cervical spine and extracranial soft tissues are normal. Sinuses/Orbits: No fluid levels or advanced mucosal thickening. No mastoid effusion. Normal orbits. IMPRESSION: 1. Bilateral acute to early subacute ischemia within the basal ganglia. This pattern is most consistent with hypoxic ischemic injury, particularly in the context of recent cardiac arrest. 2. Suspected petechial hemorrhage within both basal  ganglia. No space-occupying hematoma. 3. No midline shift or other mass effect. Electronically Signed   By: KUlyses JarredM.D.   On: 12/28/2016 00:01   Dg Chest Port 1 View  Result Date: 01/04/2017 CLINICAL DATA:  Respiratory failure EXAM: PORTABLE CHEST 1 VIEW COMPARISON:  01/01/2017 FINDINGS: Cardiac shadow is stable. Tracheostomy tube is noted in satisfactory position. Nasogastric catheter is now seen within the stomach although the proximal side port lies in the distal esophagus. Lungs are well aerated bilaterally although some patchy changes are noted in the right lung base stable from the prior exam. No bony abnormality is noted. IMPRESSION: Stable right basilar infiltrate. Tubes and lines as described. Electronically Signed   By: MInez CatalinaM.D.   On: 01/04/2017 09:27   Dg Chest Port 1 View  Result Date: 01/01/2017 CLINICAL DATA:  Acute respiratory failure with hypoxia. Tracheostomy. EXAM: PORTABLE CHEST 1 VIEW COMPARISON:  One-view chest x-ray 12/31/2016 FINDINGS: Tracheostomy tube is stable position. A small bore feeding tube terminates in the stomach. Interstitial and airspace disease remains. Increasing consolidation is present in the right middle or lower lobe. IMPRESSION: 1. Increase consolidation in the right middle or lower lobe is concerning for atelectasis or less likely infection. 2. Otherwise stable interstitial prominence bilaterally, likely reflecting edema. Electronically Signed   By: CSan MorelleM.D.   On: 01/01/2017 09:54   Dg Chest Port 1 View  Result Date: 01/01/2017 CLINICAL DATA:  Aspiration of food. Cardiac arrest and acute respiratory failure. EXAM: PORTABLE CHEST 1 VIEW COMPARISON:  01/01/2017 FINDINGS: Tracheostomy tube remains in place.  Feeding tube has been removed.  Right lower lung atelectasis or infiltrate shows no significant change. Heart size is normal. IMPRESSION: Right lower lung atelectasis versus infiltrate, without significant change. Electronically  Signed   By: Earle Gell M.D.   On: 01/01/2017 09:52   Dg Chest Port 1 View  Result Date: 12/31/2016 CLINICAL DATA:  Acute respiratory acidosis. EXAM: PORTABLE CHEST 1 VIEW COMPARISON:  12/30/2016. FINDINGS: Tracheostomy is midline. Feeding tube is followed into the stomach with the tip projecting beyond the inferior margin of the image. Heart size normal. Mild diffuse interstitial prominence and indistinctness, slightly improved. No definite pleural fluid. IMPRESSION: Mild diffuse interstitial prominence and indistinctness, slightly improved. Findings may be due to improving edema, aspiration or pneumonia. Electronically Signed   By: Lorin Picket M.D.   On: 12/31/2016 07:29   Dg Chest Port 1 View  Result Date: 12/30/2016 CLINICAL DATA:  Pneumonia. EXAM: PORTABLE CHEST 1 VIEW COMPARISON:  12/29/2016. FINDINGS: Tracheostomy to and feeding tube in stable position. Heart size normal. Diffuse multifocal pulmonary infiltrates/edema are again noted. No change. No pleural effusion or pneumothorax. IMPRESSION: 1. Lines and tubes in stable position. 2. Diffuse multifocal pulmonary infiltrates/edema are again noted. No change. Electronically Signed   By: Marcello Moores  Register   On: 12/30/2016 06:16   Dg Chest Port 1 View  Result Date: 12/29/2016 CLINICAL DATA:  Stroke and trach placement. Pt was on a cooling mat that could not be removed for the x-ray EXAM: PORTABLE CHEST - 1 VIEW COMPARISON:  Earlier film of the same day FINDINGS: Tracheostomy device has been placed in the interval, projecting in expected location. Feeding tube extends at least as far stomach, tip not seen. Patchy bibasilar sit airspace opacities right greater than left slightly improved. Perihilar vascular congestion and opacities also slightly improved. Heart size and mediastinal contours are within normal limits. No effusion.  No pneumothorax. Visualized bones unremarkable. IMPRESSION: 1. Tracheostomy placement without apparent complication. 2.  Slight improvement in bilateral infiltrates or edema. Electronically Signed   By: Lucrezia Europe M.D.   On: 12/29/2016 15:11   Dg Chest Port 1 View  Result Date: 12/29/2016 CLINICAL DATA:  Cardiac arrest. EXAM: PORTABLE CHEST 1 VIEW COMPARISON:  One-view chest x-ray 12/28/2016 FINDINGS: Endotracheal tube is in satisfactory position 4 cm above the carina. Small bore feeding tube courses off the inferior border of the film. The heart size normal. The patient is slightly rotated. Increasing interstitial and airspace disease is present bilaterally, right greater left. Detail is somewhat obscured by a grid pattern on the right, external to the patient. IMPRESSION: 1. Increasing interstitial and airspace disease concerning for edema or infection, right greater than left. 2. Support apparatus is stable. Electronically Signed   By: San Morelle M.D.   On: 12/29/2016 07:15   Dg Chest Port 1 View  Result Date: 12/28/2016 CLINICAL DATA:  Hypoxia EXAM: PORTABLE CHEST 1 VIEW COMPARISON:  December 27, 2016 FINDINGS: Endotracheal tube tip is 4.7 cm above the carina. Central catheter tip is in the superior vena cava. Feeding tube tip is below the diaphragm. No pneumothorax. There is subtle patchy opacity in the right mid lung. Lungs elsewhere clear. Heart size and pulmonary vascularity are normal. No adenopathy. No bone lesions. IMPRESSION: Tube and catheter positions as described without evident pneumothorax. Subtle infiltrate right mid lung, likely early pneumonia. Lungs elsewhere clear. Cardiac silhouette within normal limits. Electronically Signed   By: Lowella Grip III M.D.   On: 12/28/2016 10:43   Dg Chest Port 1 View  Result Date:  12/27/2016 CLINICAL DATA:  46 year old male with shortness of breath and pneumonia. EXAM: PORTABLE CHEST 1 VIEW COMPARISON:  Chest radiograph dated 12/26/2016 FINDINGS: Endotracheal tube approximately 3 cm above the carina in stable positioning. Left IJ central line appears  stable with tip likely at the junction of the SVC and left innominate vein. An enteric tube is partially visualized. Diffuse bilateral interstitial and perihilar airspace disease. There has been interval improvement of the airspace infiltrate involving the right lung compared to the prior radiograph. There is no pleural effusion or pneumothorax. Stable cardiac silhouette. No acute osseous pathology. IMPRESSION: Bilateral interstitial prominence and airspace haziness with overall slight interval improvement of the airspace disease in the right mid to lower lung field compared to prior radiograph. Clinical correlation and follow-up recommended. Support device in stable positioning. Electronically Signed   By: Anner Crete M.D.   On: 12/27/2016 06:43   Dg Chest Port 1 View  Result Date: 12/26/2016 CLINICAL DATA:  46 year old male with respiratory distress syndrome. Subsequent encounter. EXAM: PORTABLE CHEST 1 VIEW COMPARISON:  12/25/2016 FINDINGS: Endotracheal tube tip 2.4 cm above the carina. Nasogastric tube and feeding tube are in place. Tips not imaged on current exam. Left central line has been retracted with the tip distal left brachiocephalic vein level. No pneumothorax. Asymmetric airspace disease has progressed since prior examination with progression most notable in the right lower lobe. This may represent pulmonary edema with superimposed infectious infiltrate/ atelectasis right lower lobe. Heart size within normal limits. Calcified aorta. IMPRESSION: Retraction of left central line with the tip at the level of the left brachiocephalic vein. Progressive consolidation right lung base may represent atelectasis or infiltrate superimposed upon asymmetric pulmonary edema. Electronically Signed   By: Genia Del M.D.   On: 12/26/2016 07:01   Dg Chest Port 1 View  Result Date: 12/25/2016 CLINICAL DATA:  Acute respiratory failure EXAM: PORTABLE CHEST 1 VIEW COMPARISON:  Yesterday FINDINGS: Endotracheal  tube tip between the clavicular heads and carina. Feeding and orogastric tube at least reaches the stomach. Left IJ central line with tip at the SVC. Bilateral perihilar predominant lung opacity, airspace and interstitial. No effusion or pneumothorax noted. Normal heart size. IMPRESSION: 1. Stable positioning of tubes and central line. 2. Unchanged perihilar pneumonia with possible superimposed edema. Electronically Signed   By: Monte Fantasia M.D.   On: 12/25/2016 07:10   Dg Chest Port 1 View  Result Date: 12/24/2016 CLINICAL DATA:  Hypoxia EXAM: PORTABLE CHEST 1 VIEW COMPARISON:  December 23, 2016 FINDINGS: Endotracheal tube tip is 2.5 cm above the carina. Central catheter tip is in superior vena cava. Nasogastric tube tip and side-port in the stomach. There is also an apparent feeding tube with tip below the diaphragm. No pneumothorax. There is alveolar opacity in both perihilar regions, significantly increased on the left and essentially stable on the right. Heart size is normal. There is evidence of pulmonary venous hypertension. IMPRESSION: Tube and catheter positions as described without pneumothorax. Perihilar opacity bilaterally, significantly increased on the left and essentially stable on the right. Question progression of pulmonary edema versus progression of pneumonia. Both entities may exist concurrently. Stable cardiac silhouette. Note that there is a degree of pulmonary venous hypertension. Electronically Signed   By: Lowella Grip III M.D.   On: 12/24/2016 07:41   Dg Chest Port 1 View  Result Date: 12/23/2016 CLINICAL DATA:  Acute respiratory failure EXAM: PORTABLE CHEST 1 VIEW COMPARISON:  12/22/2016 FINDINGS: Cardiac shadow is stable. Endotracheal tube, nasogastric catheter and  left jugular central line are again seen. The left jugular catheter appears to have withdrawn slightly to the proximal SVC. Diffuse bilateral infiltrates are seen relatively stable given some technical  variations in the imaging. Feeding catheter is noted extending to the stomach. No acute abnormality is noted. IMPRESSION: Stable appearance of the chest when compare with the prior exam with the exception of slight withdrawal of the left jugular catheter. Electronically Signed   By: Inez Catalina M.D.   On: 12/23/2016 07:04   Dg Chest Port 1 View  Result Date: 12/22/2016 CLINICAL DATA:  Respiratory failure, cardiac arrest. Intubated patient. EXAM: PORTABLE CHEST 1 VIEW COMPARISON:  21 December 2016 portable chest x-ray. FINDINGS: The lungs are well-expanded. Confluent alveolar opacities bilaterally have become slightly less conspicuous and less confluent. There is no significant pleural effusion. There is no pneumothorax. The heart is normal in size. The pulmonary vascularity is less engorged and more distinct. The endotracheal tube tip lies 3.5 cm above the carina. The esophagogastric tube tip and proximal port project in the gastric cardia. The left internal jugular venous catheter tip projects over the junction of middle and distal thirds of the SVC. IMPRESSION: Improved appearance of the pulmonary interstitium with decreased confluence of the alveolar opacities bilaterally. Decreased pulmonary vascular congestion. The support tubes are in reasonable position. Electronically Signed   By: David  Martinique M.D.   On: 12/22/2016 07:17   Dg Chest Port 1 View  Result Date: 12/21/2016 CLINICAL DATA:  Central catheter placed EXAM: PORTABLE CHEST 1 VIEW COMPARISON:  December 21, 2016 study obtained earlier in the day FINDINGS: Central catheter tip is in the left innominate vein. Endotracheal tube tip is 2.3 cm above the carina. Nasogastric tube tip and side port in stomach. No pneumothorax. There is interstitial and alveolar opacity in a perihilar distribution, stable from earlier in the day. Heart size normal. Mild pulmonary venous hypertension. No adenopathy evident. No bone lesions. IMPRESSION: Tube and catheter  positions as described without pneumothorax. Perihilar interstitial alveolar opacity. Suspect noncardiogenic pulmonary edema, although hemorrhage or atypical pneumonia could present in this manner. Stable cardiac silhouette. Electronically Signed   By: Lowella Grip III M.D.   On: 12/21/2016 10:36   Dg Chest Port 1 View  Result Date: 12/21/2016 CLINICAL DATA:  Cardiac arrest EXAM: PORTABLE CHEST 1 VIEW COMPARISON:  None. FINDINGS: An endotracheal tube is present with tip measuring about 4.3 cm above the carina. An enteric tube has been placed. The tip is in a right lower lobe bronchus and needs to be repositioned. Normal heart size. Diffuse patchy airspace disease throughout the lungs in a perihilar distribution with peripheral sparing. This may represent edema, pneumonia, or hemorrhagic consolidation. Aspiration could also have this appearance in the appropriate setting. Prominent stomach gas. IMPRESSION: Endotracheal tube appears in satisfactory position. Enteric tube is in a right lower lobe bronchus and needs to be repositioned. Diffuse bilateral airspace infiltrates in a mostly perihilar distribution. These results were called by telephone at the time of interpretation on 12/21/2016 at 6:17 am to Dr. Joseph Berkshire , who verbally acknowledged these results. Electronically Signed   By: Lucienne Capers M.D.   On: 12/21/2016 06:18   Dg Abd Portable 1v  Result Date: 01/03/2017 CLINICAL DATA:  46 year old male. Recent cardiac arrest. Possible small bowel obstruction or ileus since 01/01/2017. Initial encounter. EXAM: PORTABLE ABDOMEN - 1 VIEW COMPARISON:  01/02/2017 and 01/01/2017, and earlier FINDINGS: Portable AP supine view at 0637 hours. An enteric tube tip is at  the level of the gastric cardia. This would place the side hole within the distal thoracic esophagus. Bowel gas pattern is improved since 01/01/2017. There is a residual a 4 cm mid abdominal small bowel loop, but other gas containing  small bowel loops are normal. Gas throughout nondilated large bowel. Two catheters or electrodes project over the pelvis as before. No acute osseous abnormality identified. No definite pneumoperitoneum on this supine view. IMPRESSION: 1. Enteric tube tip just inside the stomach indicating the side hole is in the distal esophagus. Advance 8 cm to allow for side hole placement within the stomach. 2. Improving bowel gas pattern since 01/01/2017 compatible with resolving small bowel ileus or obstruction. Electronically Signed   By: Genevie Ann M.D.   On: 01/03/2017 07:35   Dg Abd Portable 1v  Result Date: 01/02/2017 CLINICAL DATA:  Small bowel obstruction EXAM: PORTABLE ABDOMEN - 1 VIEW COMPARISON:  01/01/2017 FINDINGS: Persistent gaseous distension of small bowel loops in mid abdomen consistent with significant ileus or partial small bowel obstruction. Probable bladder catheter. Some colonic gas noted in transverse colon and descending colon. IMPRESSION: Persistent gaseous distended small bowel loops mid abdomen consistent with significant ileus or partial small bowel obstruction. Electronically Signed   By: Lahoma Crocker M.D.   On: 01/02/2017 07:52   Dg Abd Portable 1v  Result Date: 01/01/2017 CLINICAL DATA:  Projectile vomiting beginning this morning. EXAM: PORTABLE ABDOMEN - 1 VIEW COMPARISON:  12/26/2016 FINDINGS: Feeding tube is been removed since previous study. Moderately dilated small bowel loops are new since previous study. No evidence of colonic dilatation. Temperature probe noted in urinary bladder. IMPRESSION: Moderate small bowel dilatation, suspicious for distal small bowel obstruction. Electronically Signed   By: Earle Gell M.D.   On: 01/01/2017 13:22   Dg Abd Portable 1v  Result Date: 12/26/2016 CLINICAL DATA:  NG tube placement EXAM: PORTABLE ABDOMEN - 1 VIEW COMPARISON:  12/22/2016 FINDINGS: Lung bases grossly clear. Upper bowel-gas pattern within normal limits. Esophageal tube tip overlies the  duodenum jejunal junction. IMPRESSION: Esophageal tube tip overlies the ligament of Treitz. Electronically Signed   By: Donavan Foil M.D.   On: 12/26/2016 22:45   Dg Abd Portable 1v  Result Date: 12/22/2016 CLINICAL DATA:  Check feeding catheter placement EXAM: PORTABLE ABDOMEN - 1 VIEW COMPARISON:  None. FINDINGS: Feeding catheter is been placed and lies within the fourth portion of the duodenum. No obstructive changes are seen. A nasogastric catheter is coiled within the stomach. IMPRESSION: Feeding catheter within the distal duodenum. Electronically Signed   By: Inez Catalina M.D.   On: 12/22/2016 12:26    Time Spent in minutes  35   Louellen Molder M.D on 01/05/2017 at 4:12 PM  Between 7am to 7pm - Pager - [phone]  After 7pm go to [email or web address] - password [facility]  [facility] -  Office  [phone]

## 2017-01-05 NOTE — Evaluation (Signed)
Occupational Therapy Evaluation Patient Details Name: Troy Brennan MRN: [id number] DOB: July 21, 1971 Today's Date: 01/05/2017    History of Present Illness 46 year old male with ETOH and polysubstance abuse, recently discharged from detox facility 8-10 days prior to admit. Found in cardiac arrest, septic, normothermia, ARDS.   Clinical Impression   Pt alert and cooperative with evaluation. Pt was independent prior to admission. Presents with impaired cognition, generalized weakness, decreased balance and impaired activity tolerance interfering with mobility and ADL. Will follow acutely. Recommending SNF for further rehab.     Follow Up Recommendations  Supervision/Assistance - 24 hour;SNF    Equipment Recommendations       Recommendations for Other Services       Precautions / Restrictions Precautions Precautions: Fall Precaution Comments: NG tube, trach, flexiseal Restrictions Weight Bearing Restrictions: No      Mobility Bed Mobility Overal bed mobility: Needs Assistance Bed Mobility: Rolling;Supine to Sit;Sit to Supine Rolling: Max assist   Supine to sit: Max assist Sit to supine: Max assist      Transfers                      Balance Overall balance assessment: Needs assistance Sitting-balance support: Bilateral upper extremity supported Sitting balance-Leahy Scale: Poor Sitting balance - Comments: min assist for EOB sitting balance Postural control: Posterior lean                                  ADL Overall ADL's : Needs assistance/impaired Eating/Feeding: NPO   Grooming: Wash/dry hands;Wash/dry face;Minimal assistance;Bed level   Upper Body Bathing: Moderate assistance;Sitting   Lower Body Bathing: Maximal assistance;Sitting/lateral leans   Upper Body Dressing : Moderate assistance;Sitting   Lower Body Dressing: Maximal assistance;Sit to/from stand Lower Body Dressing Details (indicate cue type and reason): pt is able  to cross foot over opposite knee                     Vision Baseline Vision/History: Cataracts (L eye) Patient Visual Report: No change from baseline       Perception     Praxis      Pertinent Vitals/Pain Pain Assessment: Faces Faces Pain Scale: Hurts a little bit Pain Location: R hand/digits Pain Descriptors / Indicators: Grimacing Pain Intervention(s): Monitored during session;Repositioned     Hand Dominance Right   Extremity/Trunk Assessment Upper Extremity Assessment Upper Extremity Assessment: RUE deficits/detail;LUE deficits/detail RUE Deficits / Details: 3+/5 shoulder, 4/5 elbow, necrotic finger tips with pain, did not assess grip strength RUE Coordination: decreased fine motor;decreased gross motor LUE Deficits / Details: 3+/5 shoulder, 4-/5 elbow to hand LUE Coordination: decreased fine motor;decreased gross motor   Lower Extremity Assessment Lower Extremity Assessment: Defer to PT evaluation       Communication Communication Communication: Tracheostomy   Cognition Arousal/Alertness: Awake/alert Behavior During Therapy: Flat affect Overall Cognitive Status: Difficult to assess                 General Comments: following commands, decreased awareness of safety and deficits   General Comments       Exercises       Shoulder Instructions      Home Living Family/patient expects to be discharged to:: Unsure  Additional Comments: pt was living with his girlfriend      Prior Functioning/Environment Level of Independence: Independent        Comments: does not drive, was otherwise independent        OT Problem List: Decreased strength;Decreased activity tolerance;Impaired balance (sitting and/or standing);Decreased coordination;Decreased cognition;Impaired vision/perception;Decreased safety awareness;Decreased knowledge of use of DME or AE;Impaired UE functional use;Pain      OT  Treatment/Interventions: Self-care/ADL training;DME and/or AE instruction;Therapeutic exercise;Therapeutic activities;Cognitive remediation/compensation;Patient/family education;Balance training    OT Goals(Current goals can be found in the care plan section) Acute Rehab OT Goals Patient Stated Goal: to return home with girlfriend OT Goal Formulation: Patient unable to participate in goal setting Time For Goal Achievement: 01/19/17 Potential to Achieve Goals: Good ADL Goals Pt Will Perform Grooming: with supervision;sitting Pt Will Perform Upper Body Dressing: with min assist;sitting Pt Will Perform Lower Body Dressing: with min assist;sit to/from stand Pt Will Transfer to Toilet: with +2 assist;with min assist;ambulating;bedside commode Pt Will Perform Toileting - Clothing Manipulation and hygiene: with min assist;sit to/from stand Additional ADL Goal #1: Pt will follow two step directions with 3 of 4 times.  OT Frequency: Min 2X/week   Barriers to D/C:            Co-evaluation              End of Session Equipment Utilized During Treatment: Oxygen  Activity Tolerance: Patient tolerated treatment well Patient left: in bed;with call bell/phone within reach;with bed alarm set;with nursing/sitter in room RN aware that pt needs a new trach collar, straps broken and SCDs are malfunctioning OT Visit Diagnosis: Muscle weakness (generalized) (M62.81);Pain;Cognitive communication deficit (R41.841);Other symptoms and signs involving cognitive function Pain - Right/Left: Right Pain - part of body: Hand                ADL either performed or assessed with clinical judgement  Time: 1450-1510 OT Time Calculation (min): 20 min Charges:  OT General Charges $OT Visit: 1 Procedure OT Evaluation $OT Eval High Complexity: 1 Procedure G-Codes:     Evern Bio 01/05/2017, 3:20 PM  [phone]

## 2017-01-05 NOTE — Evaluation (Signed)
Clinical/Bedside Swallow Evaluation Patient Details  Name: Troy Brennan MRN: [id number] Date of Birth: 1971-08-09  Today's Date: 01/05/2017 Time: SLP Start Time (ACUTE ONLY): 4098 SLP Stop Time (ACUTE ONLY): 0925 SLP Time Calculation (min) (ACUTE ONLY): 4 min  Past Medical History: History reviewed. No pertinent past medical history. Past Surgical History: No past surgical history on file. HPI:  46 year old male with ETOH and polysubstance abuse, recently discharged from detox facility 8-10 days prior to admit. Found in cardiac arrest, septic, normothermia, ARDS. Found to ahve anoxic encephalopathy and bilateral basal ganglia stroke. Intubated from 2/14 to 2/22 when trach was placed. Pt currently with NG tube which has recently been dislodged concerning for aspiration of tube feed.    Assessment / Plan / Recommendation Clinical Impression  Patient presents with overt signs of aspiration and suspected reduced airway protection in the setting of tracheostomy, with presence of NG tube. Patient is alert, follows commands and responds verbally when PMSV placed. He is able to tolerate PMSV for brief intervals (~4 minutes), though continues with signs of air trapping. Swallowing assessed during PMSV placement with ice chips and sips of water from teaspoon and cup. Patient with adequate oral manipulation and appearance of timely swallow, however immediate wet vocal quality upon PO trials. Suspect presence of NG tube may be impacting epiglottic deflection/ airway protection. Cued cough, throat clear are insufficient to clear vocal quality, however suspect these may improve with downsize of trach. Provided education to patient and consulted RN, MD re: proceeding with instrumental assessment (FEES) following downsize of trach, NG tube removal. Patient will likely need cortrak placed for temporary means of nutrition/hydration. SLP will f/u for instrumental assessment as appropriate. Recommend pt remain NPO  pending instrumental exam. Dysphagia goals to be updated at that time.  SLP Visit Diagnosis: Dysphagia, unspecified (R13.10)    Aspiration Risk  Moderate aspiration risk    Diet Recommendation NPO;Alternative means - temporary        Other  Recommendations Oral Care Recommendations: Oral care QID Other Recommendations: Have oral suction available   Follow up Recommendations Skilled Nursing facility      Frequency and Duration min 2x/week  2 weeks       Prognosis Prognosis for Safe Diet Advancement: Good      Swallow Study   General Date of Onset: 12/21/16 HPI: 46 year old male with ETOH and polysubstance abuse, recently discharged from detox facility 8-10 days prior to admit. Found in cardiac arrest, septic, normothermia, ARDS. Found to ahve anoxic encephalopathy and bilateral basal ganglia stroke. Intubated from 2/14 to 2/22 when trach was placed. Pt currently with NG tube which has recently been dislodged concerning for aspiration of tube feed.  Type of Study: Bedside Swallow Evaluation Previous Swallow Assessment: none per chart Diet Prior to this Study: NPO;NG Tube Temperature Spikes Noted: Yes Respiratory Status: Trach Collar History of Recent Intubation: Yes Length of Intubations (days): 7 days Date extubated: 01/26/17 Behavior/Cognition: Alert;Cooperative Oral Cavity Assessment: Within Functional Limits Oral Care Completed by SLP: No Vision: Functional for self-feeding Self-Feeding Abilities: Able to feed self;Needs assist Patient Positioning: Upright in bed Baseline Vocal Quality: Hoarse;Low vocal intensity;Breathy Volitional Cough: Weak Volitional Swallow: Able to elicit    Oral/Motor/Sensory Function Overall Oral Motor/Sensory Function: Within functional limits   Ice Chips Ice chips: Impaired Presentation: Spoon Pharyngeal Phase Impairments: Wet Vocal Quality;Decreased hyoid-laryngeal movement   Thin Liquid Thin Liquid: Impaired Pharyngeal  Phase  Impairments: Wet Vocal Quality;Decreased hyoid-laryngeal movement    Nectar  Thick Nectar Thick Liquid: Not tested   Honey Thick Honey Thick Liquid: Not tested   Puree Puree: Not tested   Solid   GO   Solid: Not tested        Arlana Lindau 01/05/2017,9:55 AM    Rondel Baton, MS CF-SLP Speech-Language Pathologist [phone]

## 2017-01-05 NOTE — Progress Notes (Signed)
Pt trach changed and downsized to #6 cuffless.  Sutures removed.  Trach placement confirmed via positive color change on ETCO2 detector.  Pt tolerated procedure well with no distress.  RN at bedside.  Will continue to monitor.

## 2017-01-05 NOTE — Clinical Social Work Note (Signed)
Clinical Social Work Assessment  Patient Details  Name: Troy Brennan MRN: [id number] Date of Birth: October 26, 1971  Date of referral:  01/05/17               Reason for consult:  Facility Placement                Permission sought to share information with:  Oceanographer granted to share information::  Yes, Verbal Permission Granted  Name::     Retail buyer::  SNF  Relationship::  mom  Contact Information:     Housing/Transportation Living arrangements for the past 2 months:  Single Family Home Source of Information:  Parent Patient Interpreter Needed:  None Criminal Activity/Legal Involvement Pertinent to Current Situation/Hospitalization:  No - Comment as needed Significant Relationships:  Siblings, Parents Lives with:    Do you feel safe going back to the place where you live?  No Need for family participation in patient care:  Yes (Comment) (decision making)  Care giving concerns:  Pt now with trach and requiring too much physical assistance for family to safely take him home.   Social Worker assessment / plan:  CSW spoke with pt mom regarding pt care at time of DC.  Mom confirms that patient was living in [place] prior to admission but had not been working- mom is aware patient was in rehab for substance abuse prior to admission.  Mom has already been told about recommendation for SNF and hopeful for placement in [place] near family- CSW explained that because patient does not have insurance we would have to place him locally where we have connections with facilities.  CSW discussed need for patient to have insurance in case needing long term care- mom reports someone from the hospital was already working on it- CSW to f/up with financial counseling to confirm.   Employment status:    Insurance information:  Self Pay (Medicaid Pending) PT Recommendations:  Skilled Nursing Facility Information / Referral to community resources:   Skilled Nursing Facility  Patient/Family's Response to care:  Pt mom is agreeable to pt being placed at SNF- wants whatever is best for patient.  Patient/Family's Understanding of and Emotional Response to Diagnosis, Current Treatment, and Prognosis:  Pt mom seems to be aware of pt condition and realistic about his likely needs for long term care.  Emotional Assessment Appearance:  Appears stated age Attitude/Demeanor/Rapport:  Unable to Assess Affect (typically observed):  Unable to Assess Orientation:  Oriented to Self, Oriented to  Time Alcohol / Substance use:  Alcohol Use (attended inpatient rehab prior to admission) Psych involvement (Current and /or in the community):  No (Comment)  Discharge Needs  Concerns to be addressed:  Financial / Insurance Concerns, Care Coordination Readmission within the last 30 days:  No Current discharge risk:  Physical Impairment, Inadequate Financial Supports Barriers to Discharge:  Continued Medical Work up   [employer], LCSW 01/05/2017, 1:46 PM

## 2017-01-05 NOTE — Progress Notes (Signed)
Rehab admissions - Patient is not able to tolerate inpatient rehab program at this time.  I discussed with case Production designer, theatre/television/film.  Call me for questions.  #[phone]

## 2017-01-05 NOTE — Progress Notes (Signed)
46 year old male with ETOH and polysubstance abuse, recently discharged from detox facility 8-10 days prior to admit. Found 2/14  in cardiac arrest, septic, ARDS. Course complicated by prolonged ventilation due to MRSA pneumonia and influenza B requiring tracheostomy  He remains off the ventilator since 2/27, secretions have decreased somewhat and he has a good cough  More awake and interactive and less agitation  he tolerates Passy-Muir valve under supervision  Recommend- Continue to taper Seroquel and Klonopin  OT consult Continue vancomycin for 10 days total for MRSA pneumonia  downsize tracheostomy to cuff less 6, hopefully this will improve his swallowing He has an excellent prognosis for recovery and eventual decannulation   ALVA,RAKESH V. MD  [phone]

## 2017-01-06 LAB — GLUCOSE, CAPILLARY
GLUCOSE-CAPILLARY: 115 mg/dL — AB (ref 65–99)
GLUCOSE-CAPILLARY: 89 mg/dL (ref 65–99)
Glucose-Capillary: 107 mg/dL — ABNORMAL HIGH (ref 65–99)
Glucose-Capillary: 120 mg/dL — ABNORMAL HIGH (ref 65–99)
Glucose-Capillary: 123 mg/dL — ABNORMAL HIGH (ref 65–99)
Glucose-Capillary: 91 mg/dL (ref 65–99)
Glucose-Capillary: 97 mg/dL (ref 65–99)

## 2017-01-06 LAB — BASIC METABOLIC PANEL
Anion gap: 10 (ref 5–15)
BUN: 22 mg/dL — AB (ref 6–20)
CHLORIDE: 100 mmol/L — AB (ref 101–111)
CO2: 27 mmol/L (ref 22–32)
CREATININE: 0.8 mg/dL (ref 0.61–1.24)
Calcium: 8.5 mg/dL — ABNORMAL LOW (ref 8.9–10.3)
GFR calc Af Amer: >60 mL/min (ref >60)
GFR calc non Af Amer: >60 mL/min (ref >60)
GLUCOSE: 106 mg/dL — AB (ref 65–99)
POTASSIUM: 3.4 mmol/L — AB (ref 3.5–5.1)
Sodium: 137 mmol/L (ref 135–145)

## 2017-01-06 LAB — CULTURE, RESPIRATORY W GRAM STAIN

## 2017-01-06 LAB — CULTURE, RESPIRATORY

## 2017-01-06 LAB — MAGNESIUM: MAGNESIUM: 1.9 mg/dL (ref 1.7–2.4)

## 2017-01-06 MED ORDER — FENTANYL 12 MCG/HR TD PT72
12.5000 ug | MEDICATED_PATCH | TRANSDERMAL | Status: DC
  Administered 2017-01-08 – 2017-01-11 (×2): 12.5 ug via TRANSDERMAL
  Filled 2017-01-06 (×2): qty 1

## 2017-01-06 MED ORDER — CLONAZEPAM 0.5 MG PO TABS: 0.5000 mg | ORAL_TABLET | Freq: Two times a day (BID) | ORAL | Status: DC

## 2017-01-06 MED ORDER — ASPIRIN 81 MG PO CHEW
81.0000 mg | CHEWABLE_TABLET | Freq: Every day | ORAL | Status: DC
  Administered 2017-01-07 – 2017-01-12 (×6): 81 mg via ORAL
  Filled 2017-01-06 (×6): qty 1

## 2017-01-06 MED ORDER — POTASSIUM CHLORIDE CRYS ER 20 MEQ PO TBCR
40.0000 meq | EXTENDED_RELEASE_TABLET | Freq: Once | ORAL | Status: AC
  Administered 2017-01-06: 40 meq via ORAL
  Filled 2017-01-06: qty 2

## 2017-01-06 MED ORDER — RESOURCE THICKENUP CLEAR PO POWD
ORAL | Status: DC | PRN
  Filled 2017-01-06 (×2): qty 125

## 2017-01-06 MED ORDER — ACETAMINOPHEN 160 MG/5ML PO SOLN
650.0000 mg | Freq: Four times a day (QID) | ORAL | Status: DC | PRN
  Administered 2017-01-07: 650 mg via ORAL
  Filled 2017-01-06: qty 20.3

## 2017-01-06 MED ORDER — QUETIAPINE FUMARATE 100 MG PO TABS: 100.0000 mg | ORAL_TABLET | Freq: Two times a day (BID) | ORAL | Status: DC

## 2017-01-06 MED ORDER — METOPROLOL TARTRATE 25 MG/10 ML ORAL SUSPENSION
50.0000 mg | Freq: Two times a day (BID) | ORAL | Status: DC
  Administered 2017-01-06 – 2017-01-08 (×4): 50 mg via ORAL
  Administered 2017-01-08: 25 mg via ORAL
  Administered 2017-01-09 – 2017-01-12 (×7): 50 mg via ORAL
  Filled 2017-01-06 (×9): qty 20
  Filled 2017-01-06: qty 10
  Filled 2017-01-06 (×4): qty 20

## 2017-01-06 MED ORDER — PANTOPRAZOLE SODIUM 40 MG PO PACK
40.0000 mg | PACK | Freq: Every day | ORAL | Status: DC
  Administered 2017-01-06 – 2017-01-11 (×6): 40 mg via ORAL
  Filled 2017-01-06 (×6): qty 20

## 2017-01-06 NOTE — Progress Notes (Signed)
  Speech Language Pathology Treatment: Dysphagia;Passy Muir Speaking valve  Patient Details Name: Troy Brennan MRN: [id number] DOB: [birth year] Today's Date: 01/06/2017 Time: 0940-1000 SLP Time Calculation (min) (ACUTE ONLY): 20 min  Assessment / Plan / Recommendation Clinical Impression  Followed FEES with session addressing tolerance of PMSV and recommended diet. Pts family was present and were excited to hear his voice with PMSV in place. Pt initially observed to have O2 saturations around 88 and RR in the 30s, though this continued regardless of placement of PMSV or removal. Pt reported he felt pain taking breaths in the right side of his ribs. SLP left PMSV in place over 20 minutes with improvement in saturations to mid 90s and RR in the mid 20s. No signs of backpressure observed. Voice clear when effort for breath support applied. SLP provided trials fo nectar thick liquids, which pt consume impulsively with immediate coughing. Provided another cup of water thickened to honey texture with cues for single sips with no further coughing observed. Based on FEES, pt will tolerate honey thick liquids with reasonable rate of intake. However, f/u MBS will be needed for upgrade next week.  Recommend pt use PMSV with intermittent supervision to allow for use with PO intake, communication with family and staff and to facilitate secretion management. Will follow for tolerance.   HPI HPI: 46 year old male with ETOH and polysubstance abuse, recently discharged from detox facility 8-10 days prior to admit. Found in cardiac arrest, septic, normothermia, ARDS. Found to ahve anoxic encephalopathy and bilateral basal ganglia stroke. Intubated from 2/14 to 2/22 when trach was placed. Pt currently with NG tube which has recently been dislodged concerning for aspiration of tube feed.       SLP Plan          Recommendations  Diet recommendations: Dysphagia 3 (mechanical soft);Honey-thick liquid Liquids  provided via: Cup Medication Administration: Whole meds with puree Supervision: Patient able to self feed;Intermittent supervision to cue for compensatory strategies Compensations: Slow rate;Small sips/bites Postural Changes and/or Swallow Maneuvers: Seated upright 90 degrees      Patient may use Passy-Muir Speech Valve: During all waking hours (remove during sleep);During all therapies with supervision;During PO intake/meals PMSV Supervision: Intermittent         Oral Care Recommendations: Oral care QID Follow up Recommendations: Inpatient Rehab SLP Visit Diagnosis: Dysphagia, oropharyngeal phase (R13.12)       GO                Troy Brennan, Riley Nearing 01/06/2017, 2:35 PM

## 2017-01-06 NOTE — Progress Notes (Addendum)
[place] TEAM 1 - Stepdown/ICU TEAM  Troy Brennan  ERX:[id number] DOB: 10-31-1971 DOA: 12/21/2016 PCP: No PCP Per Patient    Brief Narrative:  46 year old male with ETOH and polysubstance abuse, recently discharged from detox facility 8-10 days prior to admit, who was found in cardiac arrest on 12/21/16. ROSC was achieved about 15 minutes later. He was admitted to the ICU and treated for influenza B w/ superimposed bacterial pneumonia along with ARDS.  He required prone ventilation and chemical paralytics. Lurline Idol was placed by PCCM on 12/29/2016.  He was transferred to the stepdown unit on 12/31/2016.  Subjective: Pt is alert and conversant today.  He has been cleared for modified diet and thus far is tolerating it without difficulty.  Secretions appear to have improved.    Assessment & Plan:  Acute Hypoxic Resp Failure - Influenza B + PNA + ARDS PCCM directing care of vent support and trach - appears to be stabilizing  MRSA PNA  PCCM suggesting 10 days of Vanc which will end today   Anoxic Encephalopathy  Steadily Improving - slowly weaning all sedating meds   ?Ileus v/s SBO Clinically resolved   Acute Systolic CHF s/p Cardiac arrest  EF 15-20% - no evidence of signif volume overload at this time - follow Is/Os - wgt stable Filed Weights   01/04/17 0415 01/05/17 0357 01/06/17 0325  Weight: 74.5 kg (164 lb 3.2 oz) 72.9 kg (160 lb 11.5 oz) 74.2 kg (163 lb 9.3 oz)    Dry gangrene R hand  Remains dry on exam - discussed possibility of loosing parts of his fingers w/ pt and his girlfriend  Thrombocytopenia due to severe acute illness - resolved   Hypernatremia  Resolved   Debility Given this patients profound life threatening event, and the resultant anoxic brain injury as well as severe systolic CHF, I do not expect this patient will be physically capable of work for at least 12 months  DVT prophylaxis: SCDs Code Status: DNR - NO CODE  Family Communication: No  family present at time of exam today  Disposition Plan: SDU  Consultants:  PCCM Neurology   Procedures: 2/22 Trach  Antimicrobials:  Zosyn > Rocephin > Ceftaz 2/13 > 2/21 Vanc 2/14 > 2/16 + 2/21 >    Objective: Blood pressure 123/82, pulse 94, temperature 99.6 F (37.6 C), temperature source Oral, resp. rate 20, height 6' 1"  (1.854 m), weight 74.2 kg (163 lb 9.3 oz), SpO2 90 %.  Intake/Output Summary (Last 24 hours) at 01/06/17 1350 Last data filed at 01/06/17 1259  Gross per 24 hour  Intake             2470 ml  Output             1000 ml  Net             1470 ml   Filed Weights   01/04/17 0415 01/05/17 0357 01/06/17 0325  Weight: 74.5 kg (164 lb 3.2 oz) 72.9 kg (160 lb 11.5 oz) 74.2 kg (163 lb 9.3 oz)    Examination: General: No acute respiratory distress Lungs: Clear to auscultation bilaterally  Cardiovascular: Regular rate and rhythm without murmur  Abdomen: Nontender, nondistended, soft, bowel sounds positive, no rebound Extremities: no signif LE edema   CBC:  Recent Labs Lab 12/31/16 0537 01/01/17 0333 01/02/17 0902 01/05/17 0219  WBC 12.7* 12.5*  --  8.1  NEUTROABS 10.3*  --   --   --   HGB 13.3  13.0 13.8 12.0*  HCT 41.0 40.5 42.0 37.1*  MCV 87.0 86.9  --  86.5  PLT 163 180  --  810   Basic Metabolic Panel:  Recent Labs Lab 12/31/16 0537 01/01/17 0333 01/02/17 0350 01/03/17 0333 01/04/17 0421 01/05/17 0219 01/06/17 0225  NA 140 138 135 135 135 136 137  K 4.0 3.6 4.0 3.7 3.3* 3.0* 3.4*  CL 104 100* 97* 97* 99* 101 100*  CO2 27 28 27 28 27 27 27   GLUCOSE 138* 117* 118* 111* 91 115* 106*  BUN 40* 44* 41* 42* 30* 27* 22*  CREATININE 0.98 1.08 1.24 1.07 0.85 0.93 0.80  CALCIUM 8.6* 8.6* 8.8* 8.5* 8.6* 8.3* 8.5*  MG 1.6* 2.2  --   --   --   --  1.9  PHOS 3.6  --   --   --   --   --   --    GFR: Estimated Creatinine Clearance: 122.4 mL/min (by C-G formula based on SCr of 0.8 mg/dL).  Liver Function Tests:  Recent Labs Lab 01/05/17 0219    AST 44*  ALT 47  ALKPHOS 54  BILITOT 1.5*  PROT 6.9  ALBUMIN 2.2*   CBG:  Recent Labs Lab 01/05/17 1948 01/06/17 0023 01/06/17 0331 01/06/17 0735 01/06/17 1250  GLUCAP 109* 120* 97 115* 123*    Recent Results (from the past 240 hour(s))  Culture, blood (routine x 2)     Status: None (Preliminary result)   Collection Time: 01/02/17  6:01 AM  Result Value Ref Range Status   Specimen Description BLOOD BLOOD LEFT HAND  Final   Special Requests IN PEDIATRIC BOTTLE 1CC  Final   Culture NO GROWTH 3 DAYS  Final   Report Status PENDING  Incomplete  Culture, blood (routine x 2)     Status: None (Preliminary result)   Collection Time: 01/02/17  6:03 AM  Result Value Ref Range Status   Specimen Description BLOOD BLOOD RIGHT HAND  Final   Special Requests IN PEDIATRIC BOTTLE 1CC  Final   Culture NO GROWTH 3 DAYS  Final   Report Status PENDING  Incomplete  Culture, respiratory (NON-Expectorated)     Status: None   Collection Time: 01/04/17  9:40 AM  Result Value Ref Range Status   Specimen Description TRACHEAL ASPIRATE  Final   Special Requests NONE  Final   Gram Stain   Final    MODERATE WBC PRESENT, PREDOMINANTLY PMN RARE SQUAMOUS EPITHELIAL CELLS PRESENT MODERATE GRAM NEGATIVE RODS FEW GRAM POSITIVE COCCI IN PAIRS    Culture   Final    MODERATE METHICILLIN RESISTANT STAPHYLOCOCCUS AUREUS   Report Status 01/06/2017 FINAL  Final   Organism ID, Bacteria METHICILLIN RESISTANT STAPHYLOCOCCUS AUREUS  Final      Susceptibility   Methicillin resistant staphylococcus aureus - MIC*    CIPROFLOXACIN <=0.5 SENSITIVE Sensitive     ERYTHROMYCIN <=0.25 SENSITIVE Sensitive     GENTAMICIN <=0.5 SENSITIVE Sensitive     OXACILLIN >=4 RESISTANT Resistant     TETRACYCLINE <=1 SENSITIVE Sensitive     VANCOMYCIN 1 SENSITIVE Sensitive     TRIMETH/SULFA <=10 SENSITIVE Sensitive     CLINDAMYCIN <=0.25 SENSITIVE Sensitive     RIFAMPIN <=0.5 SENSITIVE Sensitive     Inducible Clindamycin  NEGATIVE Sensitive     * MODERATE METHICILLIN RESISTANT STAPHYLOCOCCUS AUREUS     Scheduled Meds: . aspirin  81 mg Per Tube Daily  . chlorhexidine gluconate (MEDLINE KIT)  15 mL Mouth Rinse BID  .  clonazePAM  0.5 mg Per Tube BID  . feeding supplement (VITAL AF 1.2 CAL)  1,000 mL Per Tube Q24H  . fentaNYL  25 mcg Transdermal Q72H  . mouth rinse  15 mL Mouth Rinse 10 times per day  . metoprolol tartrate  50 mg Per Tube BID  . pantoprazole sodium  40 mg Per Tube QHS  . QUEtiapine  100 mg Per Tube BID  . vancomycin  750 mg Intravenous Q8H   Continuous Infusions: . dextrose 10 mL (01/04/17 1726)     LOS: 16 days   Cherene Altes, MD [facility] Office  [phone] Pager - Text Page per Shea Evans as per below:  On-Call/Text Page:      [email or web address]      password [email or web address]  If 7PM-7AM, please contact night-coverage [email or web address] Password [email or web address] 01/06/2017, 1:50 PM

## 2017-01-06 NOTE — Progress Notes (Signed)
01/06/2017 Per Nurse tech patient had 100 cc today. Bladder scan was done at 1600.  Patient had >999 in bladder and Dr Sharon Seller was made aware an order for foley was placed for urinary retention. Lovie Macadamia RN

## 2017-01-06 NOTE — Progress Notes (Signed)
46 year old male with ETOH and polysubstance abuse, recently discharged from detox facility 8-10 days prior to admit. Found 2/14 in cardiac arrest, septic, ARDS. Course complicated by prolonged ventilation due to MRSA pneumonia and influenza B requiring tracheostomy  He remains off the ventilator since 2/27, secretions have decreased somewhat and he has a good cough. He is awake and interactive per family. He is sleeping at today's exam.  Continued issues with agitation over night. Janina Mayo was downsized to a #6 cuff less  01/05/2017. He is tolerating the change well. Secretions are not an issue.Hope is that this change will improve quality of his swallow. He continues to use PMV with supervision. Recommendations per Dr. Vassie Loll: Continue  to taper Seroquel and Klonipin as able Continue Vancomycin as ordered for  MRSA pneumonia  Bevelyn Ngo, AGACNP-BC [facility] Pulmonary/Critical Care Medicine Pager # [phone] 01/06/2017

## 2017-01-06 NOTE — Progress Notes (Addendum)
01/06/2017 Foley cath was placed at 1622  and patient had 1100 cc of tea color urine [facility].

## 2017-01-06 NOTE — Progress Notes (Signed)
Physical Therapy Treatment Patient Details Name: Troy Brennan MRN: [id number] DOB: Dec 27, 1970 Today's Date: 01/06/2017    History of Present Illness 46 year old male with ETOH and polysubstance abuse, recently discharged from detox facility 8-10 days prior to admit. Found in cardiac arrest, septic, normothermia, ARDS.    PT Comments    Pt making great progress this session with functional mobility. Pt able to sit EOB x10 minutes with supervision and performed sit-to-stand x1 with min A x2. Pt also able to take a few side steps towards [facility] with mod A x2. All VSS throughout. Pt would continue to benefit from skilled physical therapy services at this time while admitted and after d/c to address his limitations in order to improve his overall safety and independence with functional mobility.    Follow Up Recommendations  CIR;Supervision/Assistance - 24 hour     Equipment Recommendations  None recommended by PT;Other (comment) (TBD based on mobility progression)    Recommendations for Other Services       Precautions / Restrictions Precautions Precautions: Fall Precaution Comments: trach, flexiseal Restrictions Weight Bearing Restrictions: No    Mobility  Bed Mobility Overal bed mobility: Needs Assistance Bed Mobility: Supine to Sit     Supine to sit: +2 for physical assistance;HOB elevated;Min assist Sit to supine: Min guard   General bed mobility comments: verbal and tactile cues to sequence supine to sit, assist to raise trunk and guide LEs over EOB, increased time, pt able to scoot hips to EOB with UE assist, no physical assist to return to supine  Transfers Overall transfer level: Needs assistance Equipment used: 2 person hand held assist Transfers: Sit to/from Stand Sit to Stand: +2 physical assistance;Min assist         General transfer comment: assist to rise and steady, able to take steps to [facility], stood x 1, laterally scooted x 1  Ambulation/Gait              General Gait Details: pt took side steps at [facility] with mod A x2, 2 person HHA   Stairs            Wheelchair Mobility    Modified Rankin (Stroke Patients Only)       Balance Overall balance assessment: Needs assistance Sitting-balance support: Feet supported Sitting balance-Leahy Scale: Fair Sitting balance - Comments: sat x 10 min with supervision   Standing balance support: Bilateral upper extremity supported Standing balance-Leahy Scale: Poor Standing balance comment: pt reliant on external supports, mod A x2                    Cognition Arousal/Alertness: Awake/alert Behavior During Therapy: Flat affect Overall Cognitive Status: Impaired/Different from baseline Area of Impairment: Safety/judgement;Problem solving;Orientation;Memory;Following commands Orientation Level: Disoriented to;Situation;Time   Memory: Decreased short-term memory;Decreased recall of precautions (i.e. - diet precautions) Following Commands: Follows one step commands consistently;Follows one step commands with increased time Safety/Judgement: Decreased awareness of safety;Decreased awareness of deficits   Problem Solving: Slow processing;Decreased initiation;Difficulty sequencing;Requires verbal cues;Requires tactile cues      Exercises      General Comments        Pertinent Vitals/Pain Pain Assessment: Faces Faces Pain Scale: Hurts a little bit Pain Location: L flank Pain Descriptors / Indicators: Sore Pain Intervention(s): Monitored during session;Repositioned    Home Living                      Prior Function  PT Goals (current goals can now be found in the care plan section) Acute Rehab PT Goals Patient Stated Goal: to return home with girlfriend PT Goal Formulation: With patient Time For Goal Achievement: 01/16/17 Potential to Achieve Goals: Fair Progress towards PT goals: Progressing toward goals    Frequency    Min  3X/week      PT Plan Current plan remains appropriate    Co-evaluation PT/OT/SLP Co-Evaluation/Treatment: Yes Reason for Co-Treatment: For patient/therapist safety;To address functional/ADL transfers;Complexity of the patient's impairments (multi-system involvement) PT goals addressed during session: Mobility/safety with mobility;Balance;Strengthening/ROM OT goals addressed during session: ADL's and self-care     End of Session Equipment Utilized During Treatment: Gait belt;Oxygen (28% trach collar) Activity Tolerance: Patient tolerated treatment well Patient left: in bed;with call bell/phone within reach;with bed alarm set Nurse Communication: Mobility status PT Visit Diagnosis: Other symptoms and signs involving the nervous system (Z61.096)     Time: 0454-0981 PT Time Calculation (min) (ACUTE ONLY): 30 min  Charges:  $Therapeutic Activity: 8-22 mins                    G CodesAlessandra Bevels Diania Co 2017/02/02, 3:49 PM  Deborah Chalk, PT, DPT [phone]

## 2017-01-06 NOTE — Progress Notes (Signed)
Occupational Therapy Treatment Patient Details Name: Troy Brennan MRN: [id number] DOB: Aug 04, 1971 Today's Date: 01/06/2017    History of present illness 46 year old male with ETOH and polysubstance abuse, recently discharged from detox facility 8-10 days prior to admit. Found in cardiac arrest, septic, normothermia, ARDS.   OT comments  Pt able to stand today with 2 person assist and take several steps to [facility]. Alert and able to talk with PSMV. Pt continues to demonstrate impaired cognition, requiring multimodal cues for direction following and increased time.   Follow Up Recommendations  SNF;Supervision/Assistance - 24 hour    Equipment Recommendations       Recommendations for Other Services      Precautions / Restrictions Precautions Precautions: Fall Precaution Comments: trach, flexiseal Restrictions Weight Bearing Restrictions: No       Mobility Bed Mobility Overal bed mobility: Needs Assistance Bed Mobility: Supine to Sit     Supine to sit: +2 for physical assistance;HOB elevated;Min assist Sit to supine: Min guard   General bed mobility comments: verbal and tactile cues to sequence supine to sit, assist to raise trunk and guide LEs over EOB, increased time, pt able to scoot hips to EOB with UE assist, no physical assist to return to supine  Transfers Overall transfer level: Needs assistance Equipment used: 2 person hand held assist Transfers: Sit to/from Stand Sit to Stand: +2 physical assistance;Min assist         General transfer comment: assist to rise and steady, able to take steps to [facility], stood x 1, laterally scooted x 1    Balance Overall balance assessment: Needs assistance   Sitting balance-Leahy Scale: Fair Sitting balance - Comments: sat x 10 min with supervision     Standing balance-Leahy Scale: Poor                     ADL   Eating/Feeding: Supervision/ safety;Bed level (increased spillage)                   Lower  Body Dressing: Total assistance;Bed level                        Vision                     Perception     Praxis      Cognition   Behavior During Therapy: Flat affect Overall Cognitive Status: Impaired/Different from baseline Area of Impairment: Safety/judgement;Problem solving;Orientation;Memory;Following commands Orientation Level: Disoriented to;Situation;Time   Memory: Decreased short-term memory;Decreased recall of precautions (swallowing)  Following Commands: Follows one step commands consistently;Follows one step commands with increased time Safety/Judgement: Decreased awareness of safety;Decreased awareness of deficits   Problem Solving: Slow processing;Decreased initiation;Difficulty sequencing;Requires verbal cues;Requires tactile cues        Exercises     Shoulder Instructions       General Comments      Pertinent Vitals/ Pain       Pain Assessment: Faces Faces Pain Scale: Hurts a little bit Pain Location: L flank Pain Descriptors / Indicators: Sore Pain Intervention(s): Monitored during session  Home Living                                          Prior Functioning/Environment              Frequency  Min 2X/week        Progress Toward Goals  OT Goals(current goals can now be found in the care plan section)     Acute Rehab OT Goals Patient Stated Goal: to return home with girlfriend Time For Goal Achievement: 01/19/17 Potential to Achieve Goals: Good  Plan Discharge plan remains appropriate    Co-evaluation    PT/OT/SLP Co-Evaluation/Treatment: Yes     OT goals addressed during session: ADL's and self-care      End of Session Equipment Utilized During Treatment: Oxygen;Gait belt (28%)  OT Visit Diagnosis: Muscle weakness (generalized) (M62.81);Pain;Cognitive communication deficit (R41.841);Other symptoms and signs involving cognitive function Pain - Right/Left: Left Pain - part of body:   (flank)   Activity Tolerance Patient tolerated treatment well   Patient Left in bed;with call bell/phone within reach;with bed alarm set   Nurse Communication          Time: 1431-1500 OT Time Calculation (min): 29 min  Charges: OT General Charges $OT Visit: 1 Procedure OT Treatments $Therapeutic Activity: 8-22 mins    Evern Bio 01/06/2017, 3:25 PM  [phone]

## 2017-01-06 NOTE — Procedures (Addendum)
Objective Swallowing Evaluation: Type of Study: FEES-Fiberoptic Endoscopic Evaluation of Swallow  Patient Details  Name: Troy Brennan MRN: [id number] Date of Birth: [birth year]  Today's Date: 01/06/2017 Time: SLP Start Time (ACUTE ONLY): 0915-SLP Stop Time (ACUTE ONLY): 0945 SLP Time Calculation (min) (ACUTE ONLY): 30 min  Past Medical History: History reviewed. No pertinent past medical history. Past Surgical History: No past surgical history on file. HPI: 46 year old male with ETOH and polysubstance abuse, recently discharged from detox facility 8-10 days prior to admit. Found in cardiac arrest, septic, normothermia, ARDS. Found to ahve anoxic encephalopathy and bilateral basal ganglia stroke. Intubated from 2/14 to 2/22 when trach was placed. Pt currently with NG tube which has recently been dislodged concerning for aspiration of tube feed.   Subjective: Alert, cooperative, follows commands   Assessment / Plan / Recommendation  CHL IP CLINICAL IMPRESSIONS 01/06/2017  Clinical Impression FEES completed after NG tube removed, PMSV in place for initial sips, then removed due expectoration of mucous during study. Direct visualization of airway showed edematous arytenoids and posterior commisure, diffuse errythema, pseudosulcus vocalis, mild ventricular obliteration. Otherwise strength and secretions WNL. Airway difficult to visualize due to short omega-shaped epiglottis and protective behaviors from pt. Swallow response, not significantly delayed, but suspect decreased airway closure before/ during the swallow leading to visualized penetraiton of thin liquids and presumed aspiration. Impulsive behaviors while drinking worsened function. During test, no penetration of nectar seen, but given coughing behavior after the test during treatment session, aspiration of nectar is suspected. For now, recommend a dys 3 (mechanical soft) diet with honey thick liquids. Recommend f/u MBS in 3-5 days for diet  upgrade with improved visualization of function during the swallow and severity of aspirate below the vocal cords. Pt to wear PMSV with all PO intake.   SLP Visit Diagnosis Dysphagia, oropharyngeal phase (R13.12)  Attention and concentration deficit following --  Frontal lobe and executive function deficit following --  Impact on safety and function Moderate aspiration risk      CHL IP TREATMENT RECOMMENDATION 01/06/2017  Treatment Recommendations F/U MBS in --- days (Comment)     Prognosis 01/06/2017  Prognosis for Safe Diet Advancement Good  Barriers to Reach Goals --  Barriers/Prognosis Comment --    CHL IP DIET RECOMMENDATION 01/06/2017  SLP Diet Recommendations Dysphagia 3 (Mech soft) solids;Honey thick liquids  Liquid Administration via Cup  Medication Administration Crushed with puree  Compensations Slow rate;Small sips/bites  Postural Changes Seated upright at 90 degrees      CHL IP OTHER RECOMMENDATIONS 01/06/2017  Recommended Consults --  Oral Care Recommendations Oral care BID  Other Recommendations Place PMSV during PO intake;Have oral suction available      CHL IP FOLLOW UP RECOMMENDATIONS 01/06/2017  Follow up Recommendations Inpatient Rehab      CHL IP FREQUENCY AND DURATION 01/06/2017  Speech Therapy Frequency (ACUTE ONLY) min 2x/week  Treatment Duration 2 weeks           No flowsheet data found.  No flowsheet data found.   No flowsheet data found.  No flowsheet data found. Harlon Ditty, MA CCC-SLP [phone]  Claudine Mouton 01/06/2017, 2:20 PM

## 2017-01-07 LAB — GLUCOSE, CAPILLARY
GLUCOSE-CAPILLARY: 94 mg/dL (ref 65–99)
GLUCOSE-CAPILLARY: 96 mg/dL (ref 65–99)
GLUCOSE-CAPILLARY: 87 mg/dL (ref 65–99)
GLUCOSE-CAPILLARY: 104 mg/dL — AB (ref 65–99)
Glucose-Capillary: 92 mg/dL (ref 65–99)

## 2017-01-07 LAB — BASIC METABOLIC PANEL
ANION GAP: 8 (ref 5–15)
BUN: 14 mg/dL (ref 6–20)
CALCIUM: 8.2 mg/dL — AB (ref 8.9–10.3)
CO2: 25 mmol/L (ref 22–32)
Chloride: 99 mmol/L — ABNORMAL LOW (ref 101–111)
Creatinine, Ser: 0.75 mg/dL (ref 0.61–1.24)
Glucose, Bld: 98 mg/dL (ref 65–99)
POTASSIUM: 3.6 mmol/L (ref 3.5–5.1)
SODIUM: 132 mmol/L — AB (ref 135–145)

## 2017-01-07 LAB — CULTURE, BLOOD (ROUTINE X 2)
Culture: NO GROWTH
Culture: NO GROWTH

## 2017-01-07 LAB — CBC
HCT: 33.9 % — ABNORMAL LOW (ref 39.0–52.0)
HEMOGLOBIN: 10.9 g/dL — AB (ref 13.0–17.0)
MCH: 27.6 pg (ref 26.0–34.0)
MCHC: 32.2 g/dL (ref 30.0–36.0)
MCV: 85.8 fL (ref 78.0–100.0)
Platelets: 225 10*3/uL (ref 150–400)
RBC: 3.95 MIL/uL — AB (ref 4.22–5.81)
RDW: 12.5 % (ref 11.5–15.5)
WBC: 7.5 10*3/uL (ref 4.0–10.5)

## 2017-01-07 NOTE — Progress Notes (Signed)
[place] TEAM 1 - Stepdown/ICU TEAM  Troy Brennan  BMW:[id number] DOB: 07/17/1971 DOA: 12/21/2016 PCP: No PCP Per Patient    Brief Narrative:  46 year old male with ETOH and polysubstance abuse who was discharged from a detox facility 8-10 days prior to admit, who was found in cardiac arrest on 12/21/16. ROSC was achieved about 15 minutes later. He was admitted to the ICU and treated for influenza B w/ superimposed bacterial pneumonia along with ARDS.  He required prone ventilation and chemical paralytics. Lurline Idol was placed by PCCM on 12/29/2016.  He was transferred to the stepdown unit on 12/31/2016.  Subjective: The patient is able to occlude his trach with his finger and speak to me clearly.  He reports some chest wall pain.  He denies substernal chest pressure, shortness of breath, fevers chills, nausea or vomiting.  Assessment & Plan:  Acute Hypoxic Resp Failure - Influenza B + PNA + ARDS PCCM directing care of trach - now liberated from the vent - appears to be stabilizing from respiratory standpoint   MRSA PNA  Has completed 10 days of Vanc  Anoxic Encephalopathy  Steadily Improving - cont to slowly wean all sedating meds - still displaying some cognitive deficiencies when working w/ therapy   Acute Systolic CHF s/p Cardiac arrest  EF 15-20% - no evidence of signif volume overload at this time - follow Is/Os - wgt slowly climbing - follow  Filed Weights   01/05/17 0357 01/06/17 0325 01/07/17 0500  Weight: 72.9 kg (160 lb 11.5 oz) 74.2 kg (163 lb 9.3 oz) 75 kg (165 lb 5.5 oz)   Acute urinary retention  RN scanned bladder 3/2 which noted >900cc retained urine - foley cath ordered and produced 1100cc of urine - keep foley in place for now - voiding trials when more mobile   Dry gangrene R hand  Remains dry on exam - discussed possibility of loosing parts of his fingers w/ pt and his girlfriend  Profuse diarrhea ?due to tube feeding - cont flexiseal for now - after  2-3 days of oral diet will consider trial w/o flexiseal - if continues to have intermittent fevers or other sx to suggest infection, will have to consider C diff testing   Thrombocytopenia due to severe acute illness - resolved   Hypernatremia  Resolved   Debility Given this patients profound life threatening event, and the resultant anoxic brain injury as well as severe systolic CHF, I do not expect this patient will be physically capable of work for at least 12 months  DVT prophylaxis: SCDs Code Status: DNR - NO CODE  Family Communication: No family present at time of exam today  Disposition Plan: stable for transfer to med bed - cont trach care - cont PT/OT   Consultants:  PCCM Neurology   Procedures: 2/22 Trach  Antimicrobials:  Zosyn > Rocephin > Ceftaz 2/13 > 2/21 Vanc 2/14 > 2/16 + 2/21 >    Objective: Blood pressure 121/90, pulse 84, temperature 98.9 F (37.2 C), temperature source Oral, resp. rate (!) 30, height 6' 1"  (1.854 m), weight 75 kg (165 lb 5.5 oz), SpO2 94 %.  Intake/Output Summary (Last 24 hours) at 01/07/17 1613 Last data filed at 01/07/17 1200  Gross per 24 hour  Intake              990 ml  Output             1770 ml  Net             -  780 ml   Filed Weights   01/05/17 0357 01/06/17 0325 01/07/17 0500  Weight: 72.9 kg (160 lb 11.5 oz) 74.2 kg (163 lb 9.3 oz) 75 kg (165 lb 5.5 oz)    Examination: General: No acute respiratory distress on simple trach collar  Lungs: Clear to auscultation bilaterally - no wheezing  Cardiovascular: Regular rate and rhythm  Abdomen: Nontender, nondistended, soft, bowel sounds positive, no rebound Extremities: no signif LE edema   CBC:  Recent Labs Lab 01/01/17 0333 01/02/17 0902 01/05/17 0219 01/07/17 0248  WBC 12.5*  --  8.1 7.5  HGB 13.0 13.8 12.0* 10.9*  HCT 40.5 42.0 37.1* 33.9*  MCV 86.9  --  86.5 85.8  PLT 180  --  247 637   Basic Metabolic Panel:  Recent Labs Lab 01/01/17 0333  01/03/17 0333  01/04/17 0421 01/05/17 0219 01/06/17 0225 01/07/17 0248  NA 138  < > 135 135 136 137 132*  K 3.6  < > 3.7 3.3* 3.0* 3.4* 3.6  CL 100*  < > 97* 99* 101 100* 99*  CO2 28  < > 28 27 27 27 25   GLUCOSE 117*  < > 111* 91 115* 106* 98  BUN 44*  < > 42* 30* 27* 22* 14  CREATININE 1.08  < > 1.07 0.85 0.93 0.80 0.75  CALCIUM 8.6*  < > 8.5* 8.6* 8.3* 8.5* 8.2*  MG 2.2  --   --   --   --  1.9  --   < > = values in this interval not displayed. GFR: Estimated Creatinine Clearance: 123.7 mL/min (by C-G formula based on SCr of 0.75 mg/dL).  Liver Function Tests:  Recent Labs Lab 01/05/17 0219  AST 44*  ALT 47  ALKPHOS 54  BILITOT 1.5*  PROT 6.9  ALBUMIN 2.2*   CBG:  Recent Labs Lab 01/06/17 2006 01/06/17 2345 01/07/17 0429 01/07/17 0748 01/07/17 1218  GLUCAP 89 91 94 96 92    Recent Results (from the past 240 hour(s))  Culture, blood (routine x 2)     Status: None   Collection Time: 01/02/17  6:01 AM  Result Value Ref Range Status   Specimen Description BLOOD BLOOD LEFT HAND  Final   Special Requests IN PEDIATRIC BOTTLE 1CC  Final   Culture NO GROWTH 5 DAYS  Final   Report Status 01/07/2017 FINAL  Final  Culture, blood (routine x 2)     Status: None   Collection Time: 01/02/17  6:03 AM  Result Value Ref Range Status   Specimen Description BLOOD BLOOD RIGHT HAND  Final   Special Requests IN PEDIATRIC BOTTLE 1CC  Final   Culture NO GROWTH 5 DAYS  Final   Report Status 01/07/2017 FINAL  Final  Culture, respiratory (NON-Expectorated)     Status: None   Collection Time: 01/04/17  9:40 AM  Result Value Ref Range Status   Specimen Description TRACHEAL ASPIRATE  Final   Special Requests NONE  Final   Gram Stain   Final    MODERATE WBC PRESENT, PREDOMINANTLY PMN RARE SQUAMOUS EPITHELIAL CELLS PRESENT MODERATE GRAM NEGATIVE RODS FEW GRAM POSITIVE COCCI IN PAIRS    Culture   Final    MODERATE METHICILLIN RESISTANT STAPHYLOCOCCUS AUREUS   Report Status 01/06/2017 FINAL   Final   Organism ID, Bacteria METHICILLIN RESISTANT STAPHYLOCOCCUS AUREUS  Final      Susceptibility   Methicillin resistant staphylococcus aureus - MIC*    CIPROFLOXACIN <=0.5 SENSITIVE Sensitive  ERYTHROMYCIN <=0.25 SENSITIVE Sensitive     GENTAMICIN <=0.5 SENSITIVE Sensitive     OXACILLIN >=4 RESISTANT Resistant     TETRACYCLINE <=1 SENSITIVE Sensitive     VANCOMYCIN 1 SENSITIVE Sensitive     TRIMETH/SULFA <=10 SENSITIVE Sensitive     CLINDAMYCIN <=0.25 SENSITIVE Sensitive     RIFAMPIN <=0.5 SENSITIVE Sensitive     Inducible Clindamycin NEGATIVE Sensitive     * MODERATE METHICILLIN RESISTANT STAPHYLOCOCCUS AUREUS     Scheduled Meds: . aspirin  81 mg Oral Daily  . chlorhexidine gluconate (MEDLINE KIT)  15 mL Mouth Rinse BID  . [START ON 01/08/2017] fentaNYL  12.5 mcg Transdermal Q72H  . mouth rinse  15 mL Mouth Rinse 10 times per day  . metoprolol tartrate  50 mg Oral BID  . pantoprazole sodium  40 mg Oral QHS   Continuous Infusions:    LOS: 17 days   Cherene Altes, MD [facility] Office  [phone] Pager - Text Page per Shea Evans as per below:  On-Call/Text Page:      [email or web address]      password [email or web address]  If 7PM-7AM, please contact night-coverage [email or web address] Password [facility] 01/07/2017, 4:13 PM

## 2017-01-08 ENCOUNTER — Encounter (HOSPITAL_COMMUNITY): Payer: Self-pay | Admitting: Surgery

## 2017-01-08 ENCOUNTER — Inpatient Hospital Stay (HOSPITAL_COMMUNITY): Payer: Self-pay

## 2017-01-08 NOTE — Progress Notes (Signed)
Patient has no order for vital signs, and he has an order for tube flush, and no longer has tube. Paged on call. 2U23: Transfered from ICU with tube flushes ordered (no tube) and I don't have an order for vital signs. He is on continuos Pulse Ox. Thank you Awaiting call back for orders.

## 2017-01-08 NOTE — Progress Notes (Signed)
Patient keeps taking off continuous pulse ox and turning off machine so staff doesn't know he has taken it off. Educated the importance of leaving it on but he is refusing. Madelin Rear, MSN, RN, [employer]

## 2017-01-08 NOTE — Progress Notes (Signed)
Patient on RA. Pulse ox off. Placed patient on 28% ATC. Placed Pulse ox on finger. Patient removed. Placed back on finger and explained to patient the need for wearing.

## 2017-01-08 NOTE — Progress Notes (Signed)
Received patient via bed to floor by RN Kathlene November. Patient alert and oriented. Has # 6 Shiley cuffless tracheostomy intact, currently on room air; previously with trach collar. PMV in bag, hanging at top of bed, as well as obturator, an extra trache,#6 at bedside. Patient has flexiseal rectal tube in, as well as foley catheter which is being used d/t urinary retention (per ICU nurse)  Ambu bag and checklist in place. On continuous pulse oximeter. Verified name and birthday on armband. Cleaned trach, applied gauze drain pad under trach. Secretions are moderate. Patient was tracheal suctioned X 1 and [place]auer several times. Patient has beard and it had copius amounts of secretions in it. I washed his beard.  Currently on a Dysphasia 3, honey thick liquid. Thickner is in room. Patient tolerated and drank thickened water. Bed alarm is on, Side rails up X2. Call bell within reach. Safety maintained..Will continue to monitor and follow drs. Orders.  Dorothe Pea, RN.  Has blackened, charred looking, mottled skin on R Hand. Knees bilaterally are scraped, miscolored, with scabs; no open areas and is open to airs. SCDS are VTE and currently on. Patient is stable. Will continue to monitor and follow physians orders.

## 2017-01-08 NOTE — Progress Notes (Signed)
PROGRESS NOTE    Troy Brennan  EML:[id number] DOB: [birth year] DOA: 12/21/2016 PCP: No PCP Per Patient    Brief Narrative:  46 year old male with ETOH and polysubstance abuse who was discharged from a detox facility 8-10 days prior to admit, who was found in cardiac arrest on 12/21/16. ROSCwas achieved about 15 minutes later. He was admitted to the ICU and treated for influenza B w/ superimposed bacterial pneumonia along with ARDS.  He required prone ventilation and chemical paralytics. Lurline Idol was placed by PCCM on 12/29/2016.  He was transferred to the stepdown unit on 12/31/2016.  Assessment & Plan:   Active Problems:   Cardiac arrest ([place])   Acute encephalopathy   Acute respiratory failure ([facility])   Pneumonia   Ventilator dependent ([place])   Encounter for central line placement   Pressure injury of skin   Anoxic brain injury ([place])   Anoxic encephalopathy (H[facility])   Aspiration of food   SBO (small bowel obstruction)   Acute systolic heart failure ([facility])   Substance abuse   SIRS (systemic inflammatory response syndrome) ([facility]CC)   Fever   Leukocytosis   Tachypnea   Hyperglycemia   Agitation  Acute Hypoxic Resp Failure - Influenza B + PNA + ARDS Pt now s/p trach while in ICU - appears to be stabilizing from respiratory standpoint  -Currently on room air  MRSA PNA  Pt has completed 10 days of Vanc -Pt remains febrile overnight - Repeat CXR with mild R basilar opacity concerning for possible pneumonia. CXR personally reviewed, appears to have improved aeration compared to prior cxr on 2/28  Anoxic Encephalopathy  Reportedly still displaying some cognitive deficiencies when working w/ therapy  - Currently stable and appropriately conversant  Acute Systolic CHF s/p Cardiac arrest  EF 15-20% - no evidence of signif volume overload at this time - follow Is/Os - wgt slowly climbing - follow   Acute urinary retention  RN scanned bladder 3/2 which noted >900cc retained urine  - foley cath ordered and produced 1100cc of urine - keep foley in place for now - Anticipate voiding trials when more mobile   Dry gangrene R hand  Remains dry on exam - possibility of loosing parts of his fingers if worsens  Profuse diarrhea Trial without Flexiseal for now - continue to monitor with supportive care  Thrombocytopenia secondary to severe acute illness - Labs reviewed. Resolved - Repeat CBC in AM  Hypernatremia  Resolved  - Repeat bmet in AM  Debility Given this patients profound life threatening event, and the resultant anoxic brain injury as well as severe systolic CHF, Dr. Thereasa Solo did not expect this patient will be physically capable of work for at least 12 months - PT/OT consulted with recs for CIR vs SNF  DVT prophylaxis: SCD's Code Status: DNR Family Communication: Pt in room, family not at bedside Disposition Plan: Uncertain at this time  Consultants:   PCCM  Neurology  Procedures:   Trach placement 2/22  Antimicrobials: Anti-infectives    Start     Dose/Rate Route Frequency Ordered Stop   01/04/17 1800  vancomycin (VANCOCIN) IVPB 750 mg/150 ml premix     750 mg 150 mL/hr over 60 Minutes Intravenous Every 8 hours 01/04/17 1739 01/06/17 1821   01/03/17 1430  cefTAZidime (FORTAZ) 1 g in dextrose 5 % 50 mL IVPB  Status:  Discontinued     1 g 100 mL/hr over 30 Minutes Intravenous Every 8 hours 01/03/17 1359 01/04/17 1555   12/28/16  2030  vancomycin (VANCOCIN) IVPB 750 mg/150 ml premix     750 mg 150 mL/hr over 60 Minutes Intravenous Every 8 hours 12/28/16 1127 01/03/17 2230   12/28/16 1230  vancomycin (VANCOCIN) 1,500 mg in sodium chloride 0.9 % 500 mL IVPB     1,500 mg 250 mL/hr over 120 Minutes Intravenous  Once 12/28/16 1127 12/28/16 1448   12/23/16 1000  oseltamivir (TAMIFLU) 6 MG/ML suspension 75 mg  Status:  Discontinued     75 mg Per Tube 2 times daily 12/23/16 0928 12/26/16 1030   12/22/16 0900  cefTAZidime (FORTAZ) 1 g in  dextrose 5 % 50 mL IVPB  Status:  Discontinued     1 g 100 mL/hr over 30 Minutes Intravenous Every 8 hours 12/22/16 0857 12/29/16 0923   12/22/16 0400  vancomycin (VANCOCIN) 1,250 mg in sodium chloride 0.9 % 250 mL IVPB  Status:  Discontinued     1,250 mg 166.7 mL/hr over 90 Minutes Intravenous Every 24 hours 12/21/16 0738 12/23/16 0839   12/21/16 1300  piperacillin-tazobactam (ZOSYN) IVPB 3.375 g  Status:  Discontinued     3.375 g 12.5 mL/hr over 240 Minutes Intravenous Every 8 hours 12/21/16 0738 12/21/16 0753   12/21/16 1000  oseltamivir (TAMIFLU) 6 MG/ML suspension 75 mg  Status:  Discontinued     75 mg Per Tube Daily 12/21/16 0802 12/23/16 0928   12/21/16 0800  cefTRIAXone (ROCEPHIN) 2 g in dextrose 5 % 50 mL IVPB  Status:  Discontinued     2 g 100 mL/hr over 30 Minutes Intravenous Every 24 hours 12/21/16 0754 12/22/16 0857   12/21/16 0615  piperacillin-tazobactam (ZOSYN) IVPB 3.375 g     3.375 g 100 mL/hr over 30 Minutes Intravenous  Once 12/21/16 0603 12/21/16 0648   12/21/16 0615  vancomycin (VANCOCIN) IVPB 1000 mg/200 mL premix     1,000 mg 200 mL/hr over 60 Minutes Intravenous  Once 12/21/16 0603 12/21/16 0746       Subjective: Without complaints. Denies chest pain or abd pain  Objective: Vitals:   01/08/17 0643 01/08/17 0823 01/08/17 1150 01/08/17 1517  BP: 119/74     Pulse: 87 89 85 88  Resp: 18 20 18 18   Temp: 100.3 F (37.9 C)     TempSrc: Oral     SpO2: 94% 93% 94% 93%  Weight:      Height:        Intake/Output Summary (Last 24 hours) at 01/08/17 1613 Last data filed at 01/08/17 1048  Gross per 24 hour  Intake                0 ml  Output             3655 ml  Net            -3655 ml   Filed Weights   01/05/17 0357 01/06/17 0325 01/07/17 0500  Weight: 72.9 kg (160 lb 11.5 oz) 74.2 kg (163 lb 9.3 oz) 75 kg (165 lb 5.5 oz)    Examination:  General exam: Appears calm and comfortable  Respiratory system: Clear to auscultation. Respiratory effort  normal. Trach in place Cardiovascular system: S1 & S2 heard, RRR. Gastrointestinal system: Abdomen is nondistended, soft and nontender. No organomegaly or masses felt. Normal bowel sounds heard. Central nervous system: Alert and oriented. No focal neurological deficits. Extremities: Symmetric 5 x 5 power. Skin: No rashes, lesions  Psychiatry: Judgement and insight appear normal. Mood & affect appropriate.   Data Reviewed: I  have personally reviewed following labs and imaging studies  CBC:  Recent Labs Lab 01/02/17 0902 01/05/17 0219 01/07/17 0248  WBC  --  8.1 7.5  HGB 13.8 12.0* 10.9*  HCT 42.0 37.1* 33.9*  MCV  --  86.5 85.8  PLT  --  247 161   Basic Metabolic Panel:  Recent Labs Lab 01/03/17 0333 01/04/17 0421 01/05/17 0219 01/06/17 0225 01/07/17 0248  NA 135 135 136 137 132*  K 3.7 3.3* 3.0* 3.4* 3.6  CL 97* 99* 101 100* 99*  CO2 28 27 27 27 25   GLUCOSE 111* 91 115* 106* 98  BUN 42* 30* 27* 22* 14  CREATININE 1.07 0.85 0.93 0.80 0.75  CALCIUM 8.5* 8.6* 8.3* 8.5* 8.2*  MG  --   --   --  1.9  --    GFR: Estimated Creatinine Clearance: 123.7 mL/min (by C-G formula based on SCr of 0.75 mg/dL). Liver Function Tests:  Recent Labs Lab 01/05/17 0219  AST 44*  ALT 47  ALKPHOS 54  BILITOT 1.5*  PROT 6.9  ALBUMIN 2.2*   No results for input(s): LIPASE, AMYLASE in the last 168 hours. No results for input(s): AMMONIA in the last 168 hours. Coagulation Profile: No results for input(s): INR, PROTIME in the last 168 hours. Cardiac Enzymes: No results for input(s): CKTOTAL, CKMB, CKMBINDEX, TROPONINI in the last 168 hours. BNP (last 3 results) No results for input(s): PROBNP in the last 8760 hours. HbA1C: No results for input(s): HGBA1C in the last 72 hours. CBG:  Recent Labs Lab 01/07/17 0429 01/07/17 0748 01/07/17 1218 01/07/17 1655 01/07/17 1930  GLUCAP 94 96 92 87 104*   Lipid Profile: No results for input(s): CHOL, HDL, LDLCALC, TRIG, CHOLHDL,  LDLDIRECT in the last 72 hours. Thyroid Function Tests: No results for input(s): TSH, T4TOTAL, FREET4, T3FREE, THYROIDAB in the last 72 hours. Anemia Panel: No results for input(s): VITAMINB12, FOLATE, FERRITIN, TIBC, IRON, RETICCTPCT in the last 72 hours. Sepsis Labs:  Recent Labs Lab 01/04/17 1030  PROCALCITON 0.57  LATICACIDVEN 0.9    Recent Results (from the past 240 hour(s))  Culture, blood (routine x 2)     Status: None   Collection Time: 01/02/17  6:01 AM  Result Value Ref Range Status   Specimen Description BLOOD BLOOD LEFT HAND  Final   Special Requests IN PEDIATRIC BOTTLE 1CC  Final   Culture NO GROWTH 5 DAYS  Final   Report Status 01/07/2017 FINAL  Final  Culture, blood (routine x 2)     Status: None   Collection Time: 01/02/17  6:03 AM  Result Value Ref Range Status   Specimen Description BLOOD BLOOD RIGHT HAND  Final   Special Requests IN PEDIATRIC BOTTLE 1CC  Final   Culture NO GROWTH 5 DAYS  Final   Report Status 01/07/2017 FINAL  Final  Culture, respiratory (NON-Expectorated)     Status: None   Collection Time: 01/04/17  9:40 AM  Result Value Ref Range Status   Specimen Description TRACHEAL ASPIRATE  Final   Special Requests NONE  Final   Gram Stain   Final    MODERATE WBC PRESENT, PREDOMINANTLY PMN RARE SQUAMOUS EPITHELIAL CELLS PRESENT MODERATE GRAM NEGATIVE RODS FEW GRAM POSITIVE COCCI IN PAIRS    Culture   Final    MODERATE METHICILLIN RESISTANT STAPHYLOCOCCUS AUREUS   Report Status 01/06/2017 FINAL  Final   Organism ID, Bacteria METHICILLIN RESISTANT STAPHYLOCOCCUS AUREUS  Final      Susceptibility   Methicillin resistant  staphylococcus aureus - MIC*    CIPROFLOXACIN <=0.5 SENSITIVE Sensitive     ERYTHROMYCIN <=0.25 SENSITIVE Sensitive     GENTAMICIN <=0.5 SENSITIVE Sensitive     OXACILLIN >=4 RESISTANT Resistant     TETRACYCLINE <=1 SENSITIVE Sensitive     VANCOMYCIN 1 SENSITIVE Sensitive     TRIMETH/SULFA <=10 SENSITIVE Sensitive      CLINDAMYCIN <=0.25 SENSITIVE Sensitive     RIFAMPIN <=0.5 SENSITIVE Sensitive     Inducible Clindamycin NEGATIVE Sensitive     * MODERATE METHICILLIN RESISTANT STAPHYLOCOCCUS AUREUS     Radiology Studies: Dg Chest Port 1 View  Result Date: 01/08/2017 CLINICAL DATA:  Fever. EXAM: PORTABLE CHEST 1 VIEW COMPARISON:  Radiograph of January 04, 2017. FINDINGS: The heart size and mediastinal contours are within normal limits. Tracheostomy is in grossly good position. No pneumothorax or pleural effusion is noted. Left lung is clear. Mild right basilar opacity is noted concerning for possible pneumonia. The visualized skeletal structures are unremarkable. IMPRESSION: Mild right basilar opacity concerning for possible pneumonia. Followup PA and lateral chest X-ray is recommended in 3-4 weeks following trial of antibiotic therapy to ensure resolution and exclude underlying malignancy. Electronically Signed   By: Marijo Conception, M.D.   On: 01/08/2017 15:17    Scheduled Meds: . aspirin  81 mg Oral Daily  . chlorhexidine gluconate (MEDLINE KIT)  15 mL Mouth Rinse BID  . fentaNYL  12.5 mcg Transdermal Q72H  . metoprolol tartrate  50 mg Oral BID  . pantoprazole sodium  40 mg Oral QHS   Continuous Infusions:   LOS: 18 days   Jerick Khachatryan, Orpah Melter, MD [facility] Pager [phone]  If 7PM-7AM, please contact night-coverage [email or web address] Password [facility] 01/08/2017, 4:13 PM

## 2017-01-09 LAB — BASIC METABOLIC PANEL
Anion gap: 7 (ref 5–15)
BUN: 10 mg/dL (ref 6–20)
CHLORIDE: 102 mmol/L (ref 101–111)
CO2: 23 mmol/L (ref 22–32)
Calcium: 8.2 mg/dL — ABNORMAL LOW (ref 8.9–10.3)
Creatinine, Ser: 0.74 mg/dL (ref 0.61–1.24)
GFR calc Af Amer: >60 mL/min (ref >60)
GFR calc non Af Amer: >60 mL/min (ref >60)
Glucose, Bld: 91 mg/dL (ref 65–99)
POTASSIUM: 3.6 mmol/L (ref 3.5–5.1)
SODIUM: 132 mmol/L — AB (ref 135–145)

## 2017-01-09 MED ORDER — VANCOMYCIN HCL IN DEXTROSE 750-5 MG/150ML-% IV SOLN
750.0000 mg | Freq: Three times a day (TID) | INTRAVENOUS | Status: DC
  Administered 2017-01-09 – 2017-01-11 (×6): 750 mg via INTRAVENOUS
  Filled 2017-01-09 (×7): qty 150

## 2017-01-09 MED ORDER — DEXTROSE 5 % IV SOLN
1.0000 g | Freq: Three times a day (TID) | INTRAVENOUS | Status: DC
  Administered 2017-01-09 – 2017-01-11 (×6): 1 g via INTRAVENOUS
  Filled 2017-01-09 (×7): qty 1

## 2017-01-09 NOTE — Progress Notes (Addendum)
Physical Therapy Treatment Patient Details Name: Troy Brennan MRN: [id number] DOB: [birth year] Today's Date: 01/09/2017    History of Present Illness 46 year old male with ETOH and polysubstance abuse, recently discharged from detox facility 8-10 days prior to admit. Found in cardiac arrest, septic, normothermia, ARDS.    PT Comments    Pt remains to present with balance and coordination deficits that place patient at an increased risk for repeated fall.  Plan remains appropriate for CIR to improve coordination and balance before returning home with support from his mother who is currently retired.  Plab next session for high level balance and continued gait activities.     Follow Up Recommendations  CIR;Supervision/Assistance - 24 hour     Equipment Recommendations  None recommended by PT;Other (comment)    Recommendations for Other Services OT consult;Rehab consult     Precautions / Restrictions Precautions Precautions: Fall Precaution Comments: trach Restrictions Weight Bearing Restrictions: No    Mobility  Bed Mobility Overal bed mobility: Needs Assistance Bed Mobility: Supine to Sit     Supine to sit: Supervision Sit to supine: Supervision   General bed mobility comments: cues for body positiong and safety to scoot to edge of bed to prepare for transfers.    Transfers Overall transfer level: Needs assistance Equipment used: Rolling walker (2 wheeled);None Transfers: Sit to/from Stand Sit to Stand: Min guard;Min assist         General transfer comment: min guard to stand and min assist to maintain due to balance deficits.  Cues for hand placement.    Ambulation/Gait Ambulation/Gait assistance: Min assist Ambulation Distance (Feet): 50 Feet (ft with out AD, staggering to R and L with slow righting response, + 51ft with RW and poor safety awareness to use device correctly.  ) Assistive device: Rolling walker (2 wheeled);None Gait Pattern/deviations:  Step-to pattern;Step-through pattern;Staggering right;Staggering left;Drifts right/left;Trunk flexed;Ataxic   Gait velocity interpretation: Below normal speed for age/gender General Gait Details: Pt required cues for increasing BOS, stride length and for reciprocal armswing with out AD.  Pt with slow righting response and unable to correct with out assistance.  Incorporated RW and patient able to maintain balance intermittently but still at times picks up RW innapropriatley and throws off his balance.  Will continue to work on high level balance activites to improve these deficits.     Stairs            Wheelchair Mobility    Modified Rankin (Stroke Patients Only)       Balance Overall balance assessment: Needs assistance Sitting-balance support: No upper extremity supported;Feet supported Sitting balance-Leahy Scale: Fair     Standing balance support: Single extremity supported Standing balance-Leahy Scale: Poor Standing balance comment: Pt remains reliant on external support to avoid LOB.   Single Leg Stance - Right Leg: 2 Single Leg Stance - Left Leg: 3     Rhomberg - Eyes Opened:  (Performed with eyes open x 1 min with intermittent use of UEs for support.  ) Rhomberg - Eyes Closed:  (Unable to maintain with out external support.  )   High Level Balance Comments: weight shifting to R and L without UEs.  Pt performed repeated STS to improve coordination.  1x10 reps.      Cognition Arousal/Alertness: Awake/alert Behavior During Therapy: Flat affect Overall Cognitive Status: Impaired/Different from baseline Area of Impairment: Safety/judgement;Problem solving;Orientation;Memory;Following commands Orientation Level: Time (Pt knew the year and day of the week but could not tell me the  it was the 5th.  )   Memory: Decreased short-term memory;Decreased recall of precautions (honey thick liquids) Following Commands: Follows multi-step commands  inconsistently Safety/Judgement: Decreased awareness of safety;Decreased awareness of deficits   Problem Solving: Slow processing;Decreased initiation;Difficulty sequencing;Requires verbal cues;Requires tactile cues General Comments: following commands, decreased awareness of safety and deficits    Exercises      General Comments        Pertinent Vitals/Pain Pain Assessment: No/denies pain Pain Score: 0-No pain Faces Pain Scale: No hurt Pain Location: L flank Pain Descriptors / Indicators: Sore Pain Intervention(s): Monitored during session    Home Living                      Prior Function            PT Goals (current goals can now be found in the care plan section) Acute Rehab PT Goals Patient Stated Goal: to return home with his mother  Potential to Achieve Goals: Fair Progress towards PT goals: Progressing toward goals    Frequency    Min 3X/week      PT Plan Current plan remains appropriate    Co-evaluation             End of Session Equipment Utilized During Treatment: Gait belt Activity Tolerance: Patient tolerated treatment well Patient left: in bed;with call bell/phone within reach;with bed alarm set Nurse Communication: Mobility status PT Visit Diagnosis: Other symptoms and signs involving the nervous system (R29.898)     Time: 1417-1440 PT Time Calculation (min) (ACUTE ONLY): 23 min  Charges:  $Gait Training: 8-22 mins $Neuromuscular Re-education: 8-22 mins                    G Codes:       Florestine Avers 2017-01-30, 3:47 PM Joycelyn Rua, PTA pager [phone]

## 2017-01-09 NOTE — Progress Notes (Signed)
Pharmacy Antibiotic Note  Troy Brennan is a 46 y.o. male admitted on 12/21/2016 with cardiac arrest.  Pharmacy has been consulted for vancomycin dosing; cefepime ordered also. Of note, he completed a 10 days course of vancomycin on 3/2 for MRSA PNA but still has a productive cough and fever.   Plan: - Vancomycin 750 mg IV q8h, goal trough 15-20 mcg/ml - Monitor renal function, clinical progress, and culture data - Trough at steady-state or as clinically indicated  Height: 6\' 1"  (185.4 cm) Weight: 155 lb 14.4 oz (70.7 kg) IBW/kg (Calculated) : 79.9  Temp (24hrs), Avg:99.5 F (37.5 C), Min:98 F (36.7 C), Max:100.2 F (37.9 C)   Recent Labs Lab 01/02/17 1934  01/04/17 0421 01/04/17 1030 01/05/17 0219 01/06/17 0225 01/07/17 0248 01/09/17 0531  WBC  --   --   --   --  8.1  --  7.5  --   CREATININE  --   < > 0.85  --  0.93 0.80 0.75 0.74  LATICACIDVEN  --   --   --  0.9  --   --   --   --   VANCOTROUGH 17  --   --   --   --   --   --   --   < > = values in this interval not displayed.  Estimated Creatinine Clearance: 116.6 mL/min (by C-G formula based on SCr of 0.74 mg/dL).    No Known Allergies  Antimicrobials this admission: Tamiflu 2/14 >> 2/19 Vanc 2/14>> 2/16; 2/21 >> 3/2; 3/5 >> CTX 2/14>>2/15 Ceftaz 2/15>>2/22 2/27 >> 3/1 Zosyn 2/14 x1 Cefepime 3/5 >>   Dose adjustments this admission: 2/23 VT = 18 on 750mg  IV q8 (drawn ~ 30 minutes early, actual trough closer to 16) 2/26 VT = 14 (continue current dose)  Microbiology results: 2/14 flu B+ 2/14 MRSA PCR neg 2/14 TA: normal flora 2/14 UCx: neg 2/14 BCx: neg 2/20 TA: MRSA 2/26 BCx: neg 2/28 TA: MRSA 3/5 BCx:   Thank you for allowing pharmacy to be a part of this patient's care.  Loura Back, PharmD, BCPS Clinical Pharmacist Phone for today [phone] Main pharmacy - [phone] 01/09/2017 3:50 PM

## 2017-01-09 NOTE — Progress Notes (Signed)
PCCM Interval Progress Note  45 year old male with ETOH and polysubstance abuse, recently discharged from detox facility 8-10 days prior to admit. Found 2/14 in cardiac arrest, septic, ARDS. Course complicated by prolonged ventilation due to MRSA pneumonia and influenza B requiring tracheostomy  He has remained off the ventilator since 2/27 and trach was downsized to a #6 cuff less 01/05/2017.  Secretions have decreased somewhat and he has a good cough. He is awake and interactive and is tolerating ATC with PMV well.  No complaints 01/09/17, is in good spirits.  BP 109/74 (BP Location: Right Arm)   Pulse 75   Temp 100.2 F (37.9 C) (Oral)   Resp 20   Ht 6\' 1"  (1.854 m)   Wt 70.7 kg (155 lb 14.4 oz)   SpO2 94%   BMI 20.57 kg/m   Gen: Adult male in no distress. CV: RRR, no M/R/G. Lungs: Clear. Ext: No edema. Skin: cyanotic fingers on right (not new).  Multiple tattoos throughout body.  Skin otherwise warm and dry.  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  He is tolerating downsize to #6 cuffless well. Secretions are not an issue. Hope is that this change will improve quality of his swallow. Plan: Continue PMV. Continue PT efforts. If continues to do well, will consider capping trials for hopes of decannulation soon. Primary to readdress code status given that his prognosis is now much improved compared to initial admit.  PCCM will see again 03/07 and at that time, can consider capping trials.  Please call back if needed sooner.   Rutherford Guys, [place] [facility] Pager: [phone]  or [phone] 01/09/2017, 11:43 AM

## 2017-01-09 NOTE — Progress Notes (Addendum)
PROGRESS NOTE    Troy Brennan  WUJ:[id number] DOB: [birth year] DOA: 12/21/2016 PCP: No PCP Per Patient    Brief Narrative:  46 year old male with ETOH and polysubstance abuse who was discharged from a detox facility 8-10 days prior to admit, who was found in cardiac arrest on 12/21/16. ROSCwas achieved about 15 minutes later. He was admitted to the ICU and treated for influenza B w/ superimposed bacterial pneumonia along with ARDS.  He required prone ventilation and chemical paralytics. Lurline Idol was placed by PCCM on 12/29/2016.  He was transferred to the stepdown unit on 12/31/2016.  Assessment & Plan:   Active Problems:   Cardiac arrest ([place])   Acute encephalopathy   Acute respiratory failure ([facility])   Pneumonia   Ventilator dependent ([place])   Encounter for central line placement   Pressure injury of skin   Anoxic brain injury ([place])   Anoxic encephalopathy (H[facility])   Aspiration of food   SBO (small bowel obstruction)   Acute systolic heart failure (H[facility])   Substance abuse   SIRS (systemic inflammatory response syndrome) (HCC)   Fever   Leukocytosis   Tachypnea   Hyperglycemia   Agitation  Acute Hypoxic Resp Failure - Influenza B + PNA + ARDS Pt now s/p trach while in ICU - appears to be stabilizing from respiratory standpoint  -Currently on room air - Pulmonary following for trach management  MRSA PNA  Pt has completed 10 days of Vanc -Pt remains febrile thus far - Repeat CXR with mild R basilar opacity concerning for possible pneumonia. CXR personally reviewed, appears to have improved aeration compared to prior cxr on 2/28 - Patient reports continued productive cough - Will resume abx, to cover HCAP/VAP  Anoxic Encephalopathy  Reportedly still displaying some cognitive deficiencies when working w/ therapy  - Presently stable and appropriately conversant  Acute Systolic CHF s/p Cardiac arrest  EF 15-20% - no evidence of signif volume overload at this time -  follow Is/Os - wgt slowly climbing - will continue to follow   Acute urinary retention  Foley d/c'd on transfer to floor Patient voiding well thus far Continue to monitor  Dry gangrene R hand  Remains dry on exam - pt is aware of risk of loosing parts of his fingers if worsens  Profuse diarrhea Trial without Flexiseal for now - Plan to continue to monitor with supportive care  Thrombocytopenia secondary to severe acute illness - Labs reviewed. Normalized - will recheck cbc in AM  Hypernatremia  Resolved  - Will recheck bmet in AM  Debility Given this patients profound life threatening event, and the resultant anoxic brain injury as well as severe systolic CHF, Dr. Thereasa Solo did not expect this patient will be physically capable of work for at least 12 months - PT/OT consulted with recs for CIR vs SNF - CIR consulted. If unable to arrange CIR, then would plan SNF  DVT prophylaxis: SCD's Code Status: DNR Family Communication: Pt in room, family not at bedside Disposition Plan: Uncertain at this time  Consultants:   PCCM  Neurology  Procedures:   Trach placement 2/22  Antimicrobials: Anti-infectives    Start     Dose/Rate Route Frequency Ordered Stop   01/04/17 1800  vancomycin (VANCOCIN) IVPB 750 mg/150 ml premix     750 mg 150 mL/hr over 60 Minutes Intravenous Every 8 hours 01/04/17 1739 01/06/17 1821   01/03/17 1430  cefTAZidime (FORTAZ) 1 g in dextrose 5 % 50 mL IVPB  Status:  Discontinued     1 g 100 mL/hr over 30 Minutes Intravenous Every 8 hours 01/03/17 1359 01/04/17 1555   12/28/16 2030  vancomycin (VANCOCIN) IVPB 750 mg/150 ml premix     750 mg 150 mL/hr over 60 Minutes Intravenous Every 8 hours 12/28/16 1127 01/03/17 2230   12/28/16 1230  vancomycin (VANCOCIN) 1,500 mg in sodium chloride 0.9 % 500 mL IVPB     1,500 mg 250 mL/hr over 120 Minutes Intravenous  Once 12/28/16 1127 12/28/16 1448   12/23/16 1000  oseltamivir (TAMIFLU) 6 MG/ML suspension  75 mg  Status:  Discontinued     75 mg Per Tube 2 times daily 12/23/16 0928 12/26/16 1030   12/22/16 0900  cefTAZidime (FORTAZ) 1 g in dextrose 5 % 50 mL IVPB  Status:  Discontinued     1 g 100 mL/hr over 30 Minutes Intravenous Every 8 hours 12/22/16 0857 12/29/16 0923   12/22/16 0400  vancomycin (VANCOCIN) 1,250 mg in sodium chloride 0.9 % 250 mL IVPB  Status:  Discontinued     1,250 mg 166.7 mL/hr over 90 Minutes Intravenous Every 24 hours 12/21/16 0738 12/23/16 0839   12/21/16 1300  piperacillin-tazobactam (ZOSYN) IVPB 3.375 g  Status:  Discontinued     3.375 g 12.5 mL/hr over 240 Minutes Intravenous Every 8 hours 12/21/16 0738 12/21/16 0753   12/21/16 1000  oseltamivir (TAMIFLU) 6 MG/ML suspension 75 mg  Status:  Discontinued     75 mg Per Tube Daily 12/21/16 0802 12/23/16 0928   12/21/16 0800  cefTRIAXone (ROCEPHIN) 2 g in dextrose 5 % 50 mL IVPB  Status:  Discontinued     2 g 100 mL/hr over 30 Minutes Intravenous Every 24 hours 12/21/16 0754 12/22/16 0857   12/21/16 0615  piperacillin-tazobactam (ZOSYN) IVPB 3.375 g     3.375 g 100 mL/hr over 30 Minutes Intravenous  Once 12/21/16 0603 12/21/16 0648   12/21/16 0615  vancomycin (VANCOCIN) IVPB 1000 mg/200 mL premix     1,000 mg 200 mL/hr over 60 Minutes Intravenous  Once 12/21/16 0603 12/21/16 0746      Subjective: Denies chest pain. Reports continued productive cough  Objective: Vitals:   01/09/17 0435 01/09/17 0600 01/09/17 0758 01/09/17 1447  BP:  109/74  120/71  Pulse: 75 75  74  Resp: 18 20  18   Temp:  100.2 F (37.9 C)  98 F (36.7 C)  TempSrc:  Oral  Oral  SpO2: 94% 94%  95%  Weight:   70.7 kg (155 lb 14.4 oz)   Height:        Intake/Output Summary (Last 24 hours) at 01/09/17 1523 Last data filed at 01/09/17 0900  Gross per 24 hour  Intake                0 ml  Output              651 ml  Net             -651 ml   Filed Weights   01/06/17 0325 01/07/17 0500 01/09/17 0758  Weight: 74.2 kg (163 lb 9.3 oz)  75 kg (165 lb 5.5 oz) 70.7 kg (155 lb 14.4 oz)    Examination:  General exam: Laying in bed, awake, in nad Respiratory system: coarse breath sounds, normal resp effort, trach in place Cardiovascular system: regular rate, s1-2 Gastrointestinal system: soft, nondistended, pos BS Central nervous system: cn2-12 grossly intact, strength intact Extremities: perfused, no clubbing Skin: Normal skin turgor, no  notable skin lesions seen Psychiatry: mood normal// no visual hallucinations   Data Reviewed: I have personally reviewed following labs and imaging studies  CBC:  Recent Labs Lab 01/05/17 0219 01/07/17 0248  WBC 8.1 7.5  HGB 12.0* 10.9*  HCT 37.1* 33.9*  MCV 86.5 85.8  PLT 247 053   Basic Metabolic Panel:  Recent Labs Lab 01/04/17 0421 01/05/17 0219 01/06/17 0225 01/07/17 0248 01/09/17 0531  NA 135 136 137 132* 132*  K 3.3* 3.0* 3.4* 3.6 3.6  CL 99* 101 100* 99* 102  CO2 27 27 27 25 23   GLUCOSE 91 115* 106* 98 91  BUN 30* 27* 22* 14 10  CREATININE 0.85 0.93 0.80 0.75 0.74  CALCIUM 8.6* 8.3* 8.5* 8.2* 8.2*  MG  --   --  1.9  --   --    GFR: Estimated Creatinine Clearance: 116.6 mL/min (by C-G formula based on SCr of 0.74 mg/dL). Liver Function Tests:  Recent Labs Lab 01/05/17 0219  AST 44*  ALT 47  ALKPHOS 54  BILITOT 1.5*  PROT 6.9  ALBUMIN 2.2*   No results for input(s): LIPASE, AMYLASE in the last 168 hours. No results for input(s): AMMONIA in the last 168 hours. Coagulation Profile: No results for input(s): INR, PROTIME in the last 168 hours. Cardiac Enzymes: No results for input(s): CKTOTAL, CKMB, CKMBINDEX, TROPONINI in the last 168 hours. BNP (last 3 results) No results for input(s): PROBNP in the last 8760 hours. HbA1C: No results for input(s): HGBA1C in the last 72 hours. CBG:  Recent Labs Lab 01/07/17 0429 01/07/17 0748 01/07/17 1218 01/07/17 1655 01/07/17 1930  GLUCAP 94 96 92 87 104*   Lipid Profile: No results for input(s):  CHOL, HDL, LDLCALC, TRIG, CHOLHDL, LDLDIRECT in the last 72 hours. Thyroid Function Tests: No results for input(s): TSH, T4TOTAL, FREET4, T3FREE, THYROIDAB in the last 72 hours. Anemia Panel: No results for input(s): VITAMINB12, FOLATE, FERRITIN, TIBC, IRON, RETICCTPCT in the last 72 hours. Sepsis Labs:  Recent Labs Lab 01/04/17 1030  PROCALCITON 0.57  LATICACIDVEN 0.9    Recent Results (from the past 240 hour(s))  Culture, blood (routine x 2)     Status: None   Collection Time: 01/02/17  6:01 AM  Result Value Ref Range Status   Specimen Description BLOOD BLOOD LEFT HAND  Final   Special Requests IN PEDIATRIC BOTTLE 1CC  Final   Culture NO GROWTH 5 DAYS  Final   Report Status 01/07/2017 FINAL  Final  Culture, blood (routine x 2)     Status: None   Collection Time: 01/02/17  6:03 AM  Result Value Ref Range Status   Specimen Description BLOOD BLOOD RIGHT HAND  Final   Special Requests IN PEDIATRIC BOTTLE 1CC  Final   Culture NO GROWTH 5 DAYS  Final   Report Status 01/07/2017 FINAL  Final  Culture, respiratory (NON-Expectorated)     Status: None   Collection Time: 01/04/17  9:40 AM  Result Value Ref Range Status   Specimen Description TRACHEAL ASPIRATE  Final   Special Requests NONE  Final   Gram Stain   Final    MODERATE WBC PRESENT, PREDOMINANTLY PMN RARE SQUAMOUS EPITHELIAL CELLS PRESENT MODERATE GRAM NEGATIVE RODS FEW GRAM POSITIVE COCCI IN PAIRS    Culture   Final    MODERATE METHICILLIN RESISTANT STAPHYLOCOCCUS AUREUS   Report Status 01/06/2017 FINAL  Final   Organism ID, Bacteria METHICILLIN RESISTANT STAPHYLOCOCCUS AUREUS  Final      Susceptibility  Methicillin resistant staphylococcus aureus - MIC*    CIPROFLOXACIN <=0.5 SENSITIVE Sensitive     ERYTHROMYCIN <=0.25 SENSITIVE Sensitive     GENTAMICIN <=0.5 SENSITIVE Sensitive     OXACILLIN >=4 RESISTANT Resistant     TETRACYCLINE <=1 SENSITIVE Sensitive     VANCOMYCIN 1 SENSITIVE Sensitive     TRIMETH/SULFA  <=10 SENSITIVE Sensitive     CLINDAMYCIN <=0.25 SENSITIVE Sensitive     RIFAMPIN <=0.5 SENSITIVE Sensitive     Inducible Clindamycin NEGATIVE Sensitive     * MODERATE METHICILLIN RESISTANT STAPHYLOCOCCUS AUREUS     Radiology Studies: Dg Chest Port 1 View  Result Date: 01/08/2017 CLINICAL DATA:  Fever. EXAM: PORTABLE CHEST 1 VIEW COMPARISON:  Radiograph of January 04, 2017. FINDINGS: The heart size and mediastinal contours are within normal limits. Tracheostomy is in grossly good position. No pneumothorax or pleural effusion is noted. Left lung is clear. Mild right basilar opacity is noted concerning for possible pneumonia. The visualized skeletal structures are unremarkable. IMPRESSION: Mild right basilar opacity concerning for possible pneumonia. Followup PA and lateral chest X-ray is recommended in 3-4 weeks following trial of antibiotic therapy to ensure resolution and exclude underlying malignancy. Electronically Signed   By: Marijo Conception, M.D.   On: 01/08/2017 15:17    Scheduled Meds: . aspirin  81 mg Oral Daily  . chlorhexidine gluconate (MEDLINE KIT)  15 mL Mouth Rinse BID  . fentaNYL  12.5 mcg Transdermal Q72H  . metoprolol tartrate  50 mg Oral BID  . pantoprazole sodium  40 mg Oral QHS   Continuous Infusions:   LOS: 19 days   Kimila Papaleo, Orpah Melter, MD [facility] Pager [phone]  If 7PM-7AM, please contact night-coverage [email or web address] Password [facility] 01/09/2017, 3:23 PM

## 2017-01-09 NOTE — Progress Notes (Signed)
Occupational Therapy Treatment Patient Details Name: Troy Brennan MRN: [id number] DOB: 21-Jun-1971 Today's Date: 01/09/2017    History of present illness 46 year old male with ETOH and polysubstance abuse, recently discharged from detox facility 8-10 days prior to admit. Found in cardiac arrest, septic, normothermia, ARDS.   OT comments  This 46 yo male admitted with above making progress today with LB ADLs, toileting transfers, and cognition. He will continue to benefit from acute OT with follow up on CIR to get him to an independent level.    Follow Up Recommendations  CIR;Supervision/Assistance - 24 hour    Equipment Recommendations  None recommended by OT       Precautions / Restrictions Precautions Precautions: Fall Precaution Comments: trach Restrictions Weight Bearing Restrictions: No       Mobility Bed Mobility Overal bed mobility: Needs Assistance       Supine to sit: Supervision Sit to supine: Supervision   General bed mobility comments: cues only due to still a fall risk  Transfers Overall transfer level: Needs assistance Equipment used: 1 person hand held assist Transfers: Sit to/from Stand Sit to Stand: Min assist         General transfer comment: VCs for safe hand placement    Balance Overall balance assessment: Needs assistance Sitting-balance support: No upper extremity supported;Feet supported Sitting balance-Leahy Scale: Good     Standing balance support: Single extremity supported Standing balance-Leahy Scale: Poor                     ADL Overall ADL's : Needs assistance/impaired Eating/Feeding: Supervision/ safety Eating/Feeding Details (indicate cue type and reason): drinking honey thickened water                 Lower Body Dressing: Min guard;Sit to/from stand   Toilet Transfer: Minimal assistance;Ambulation Toilet Transfer Details (indicate cue type and reason): initally with RW and then without (pt Min A  with both)                            Cognition   Behavior During Therapy: Flat affect Overall Cognitive Status: Impaired/Different from baseline   Orientation Level: Disoriented to;Time (year and day of week. Did know it was March, [place], and he was here because he was found down)      Following Commands: Follows multi-step commands inconsistently                         Pertinent Vitals/ Pain       Pain Assessment: No/denies pain         Frequency  Min 2X/week        Progress Toward Goals  OT Goals(current goals can now be found in the care plan section)  Progress towards OT goals: Progressing toward goals     Plan Discharge plan needs to be updated       End of Session Equipment Utilized During Treatment: Gait belt  OT Visit Diagnosis: Unsteadiness on feet (R26.81);Cognitive communication deficit (R41.841)   Activity Tolerance Patient tolerated treatment well   Patient Left in bed;with call bell/phone within reach;with bed alarm set   Nurse Communication          Time: 8546-2703 OT Time Calculation (min): 21 min  Charges: OT General Charges $OT Visit: 1 Procedure OT Treatments $Self Care/Home Management : 8-22 mins  Ignacia Palma, OTR/L [phone] 01/09/2017

## 2017-01-09 NOTE — Progress Notes (Signed)
Nutrition Follow-up  DOCUMENTATION CODES:   Not applicable  INTERVENTION:   -Magic Cup TID with meals  NUTRITION DIAGNOSIS:   Inadequate oral intake related to dysphagia as evidenced by meal completion < 50%.  GOAL:   Patient will meet greater than or equal to 90% of their needs  MONITOR:   PO intake, Supplement acceptance, Diet advancement, Labs, Weight trends, Skin, I & O's  REASON FOR ASSESSMENT:   Consult Enteral/tube feeding initiation and management  ASSESSMENT:   Pt with PMH of ETOH and polysubstance abuse, recently discharged from detox facility 8-10 days ago. Found in cardiac arrest, septic, normothermia, ards.  2/27- extubated 3/1- trach downsized (#6 cuffless) 3/2- NGT removed, s/p FEES- advanced to dysphagia 3 diet with honey thick liquids 3/4- transferred from SDU to floor  Pt resting in bed at time of visit. Noted less than <25% completion of lunch tray. Per doc flowsheets, 20-50% meal completion.   Reviewed SLP notes, recommending follow-up MBSS. Pt with PSMV. Per CCM notes, pt may begin capping trials on 01/11/17 with hopes for potential decannulation.   Labs reviewed: Na: 132, CBGS: 104.   Diet Order:  DIET DYS 3 Room service appropriate? Yes; Fluid consistency: Honey Thick  Skin:  Reviewed, no issues  Last BM:  01/07/17  Height:   Ht Readings from Last 1 Encounters:  12/23/16 6\' 1"  (1.854 m)    Weight:   Wt Readings from Last 1 Encounters:  01/09/17 155 lb 14.4 oz (70.7 kg)    Ideal Body Weight:  83.6 kg  BMI:  Body mass index is 20.57 kg/m.  Estimated Nutritional Needs:   Kcal:  2100-2300  Protein:  110-125 grams  Fluid:  2.1-2.3 L  EDUCATION NEEDS:   Education needs addressed  Annick Dimaio A. Mayford Knife, RD, LDN, CDE Pager: [phone] After hours Pager: [phone]

## 2017-01-09 NOTE — Progress Notes (Signed)
  Speech Language Pathology Treatment: Dysphagia;Passy Muir Speaking valve  Patient Details Name: Troy Brennan MRN: [id number] DOB: 08/21/1971 Today's Date: 01/09/2017 Time: 1027-2536 SLP Time Calculation (min) (ACUTE ONLY): 13 min  Assessment / Plan / Recommendation Clinical Impression  Pt with #6 cuffless trach, wearing PMSV during treatment and able to expectorate tracheal secretions. Vocal quality mildly hoarse, however was intelligible overall and communicating at the conversation level. [name] was observed with honey thick liquids which resulted in a delayed cough during 1 trial. Mastication of solids was timely with no oral phase impairments. Educated pt re: diet recommendations, plan for instrumental testing, and PMSV. MBS planned for tomorrow to objectively assess swallowing mechanism and for possible liquid and diet upgrade. Recommend continuing with Dys 3 solids, honey thick liquids (no straws), meds whole in puree, slow rate, and small sips/bites. Wear PMSV during all waking hours. ST will follow up for PMSV and swallow treatment.   HPI HPI: 46 year old male with ETOH and polysubstance abuse, recently discharged from detox facility 8-10 days prior to admit. Found in cardiac arrest, septic, normothermia, ARDS. Found to have anoxic encephalopathy and bilateral basal ganglia stroke. Intubated from 2/14 to 2/22 when trach was placed.      SLP Plan  MBS       Recommendations  Diet recommendations: Honey-thick liquid;Dysphagia 3 (mechanical soft) Liquids provided via: Cup;No straw Medication Administration: Whole meds with puree Supervision: Patient able to self feed;Intermittent supervision to cue for compensatory strategies Compensations: Slow rate;Small sips/bites Postural Changes and/or Swallow Maneuvers: Seated upright 90 degrees      Patient may use Passy-Muir Speech Valve: During all waking hours (remove during sleep) PMSV Supervision: Intermittent         Oral Care Recommendations: Oral care BID Follow up Recommendations: Skilled Nursing facility SLP Visit Diagnosis: Dysphagia, unspecified (R13.10) Plan: MBS       GO                Macarthur Critchley , Student-SLP 01/09/2017, 3:47 PM

## 2017-01-10 ENCOUNTER — Inpatient Hospital Stay (HOSPITAL_COMMUNITY): Payer: Self-pay

## 2017-01-10 NOTE — Progress Notes (Signed)
Physical Therapy Treatment Patient Details Name: Troy Brennan MRN: [id number] DOB: [birth year] Today's Date: 01/10/2017    History of Present Illness 46 year old male with ETOH and polysubstance abuse, recently discharged from detox facility 8-10 days prior to admit. Found in cardiac arrest, septic, normothermia, ARDS.    PT Comments    Pt progressing well, balance deficits remain but able to perform gait with out device.  Pt requires assist to correct LOB x2 when turning L. Will continue to recommend short term stay in a post acute setting before returning home with mother.      Follow Up Recommendations  CIR;Supervision/Assistance - 24 hour     Equipment Recommendations  None recommended by PT;Other (comment)    Recommendations for Other Services OT consult;Rehab consult     Precautions / Restrictions Precautions Precautions: Fall Precaution Comments: trach Restrictions Weight Bearing Restrictions: No    Mobility  Bed Mobility Overal bed mobility: Modified Independent       Supine to sit: Modified independent (Device/Increase time) Sit to supine: Modified independent (Device/Increase time)   General bed mobility comments: Good technique into and OOB.    Transfers Overall transfer level: Needs assistance Equipment used: None Transfers: Sit to/from Stand Sit to Stand: Min guard         General transfer comment: Cues for safety to push from seated surface and reach back for seated surface  Ambulation/Gait Ambulation/Gait assistance: Min guard;Min assist (LOB x2 to the R required min A.  ) Ambulation Distance (Feet): 180 Feet Assistive device: None Gait Pattern/deviations: Step-through pattern;Ataxic;Staggering right;Drifts right/left;Decreased stride length   Gait velocity interpretation: Below normal speed for age/gender General Gait Details: Pt required cues for reciprocal armswing, weight shifting, upper trunk control and forward gaze.  LOB with  head turns and turning in general.     Stairs            Wheelchair Mobility    Modified Rankin (Stroke Patients Only)       Balance Overall balance assessment: Needs assistance   Sitting balance-Leahy Scale: Fair       Standing balance-Leahy Scale: Poor Standing balance comment: Pt remains reliant on external support to avoid LOB.       Tandem Stance - Right Leg: 26 Tandem Stance - Left Leg: 5 Rhomberg - Eyes Opened: 30 Rhomberg - Eyes Closed: 17   High Level Balance Comments: weight shifting to R and L without UEs.  Dynamic reaching in standing position.     Cognition Arousal/Alertness: Awake/alert Behavior During Therapy: Flat affect Overall Cognitive Status: Impaired/Different from baseline Area of Impairment: Safety/judgement       Following Commands: Follows one step commands consistently Safety/Judgement: Decreased awareness of safety;Decreased awareness of deficits   Problem Solving: Slow processing;Decreased initiation      Exercises General Exercises - Lower Extremity Long Arc Quad: AROM;Both;10 reps;Seated (cues to hold end range x 5 sec to increase difficulty.) Hip Flexion/Marching: AROM;Both;10 reps;Seated Heel Raises: AROM;Both;10 reps;Standing Mini-Sqauts: AROM;Both;Standing (x2 reps then reports legs are sore.  )    General Comments        Pertinent Vitals/Pain Pain Assessment: No/denies pain Pain Score: 0-No pain Faces Pain Scale: No hurt    Home Living                      Prior Function            PT Goals (current goals can now be found in the care plan section)  Acute Rehab PT Goals Patient Stated Goal: to return home with his mother  Potential to Achieve Goals: Fair Progress towards PT goals: Progressing toward goals    Frequency    Min 3X/week      PT Plan Current plan remains appropriate    Co-evaluation             End of Session Equipment Utilized During Treatment: Gait belt Activity  Tolerance: Patient tolerated treatment well Patient left: in bed;with bed alarm set;with call bell/phone within reach Nurse Communication: Mobility status PT Visit Diagnosis: Other symptoms and signs involving the nervous system (R29.898)     Time: 1610-9604 PT Time Calculation (min) (ACUTE ONLY): 23 min  Charges:  $Gait Training: 8-22 mins $Therapeutic Exercise: 8-22 mins                    G Codes:       Florestine Avers 02-05-17, 5:30 PM Joycelyn Rua, PTA pager [phone]

## 2017-01-10 NOTE — Progress Notes (Signed)
Rehab admissions - I placed a call to patient's mother, no response thus far.  I did speak with patient's sister who will have her mother contact me.  I met with patient yesterday and he is doing much better.  I would like to confirm discharge plan with mom and to discuss rehab options with mom.  Call me for questions.  #[phone] 

## 2017-01-10 NOTE — Care Management Note (Addendum)
Case Management Note  Patient Details  Name: Troy Brennan MRN: [id number] Date of Birth: 1971/10/13  Subjective/Objective:   Acute hypoxic resp failure, influenza B, PNA, ARDS, Acute Encephalopathy, CHF, debility                Action/Plan: Discharge Planning: Chart reviewed. CIR vs SNF at dc. CSW following for SNF placement. CIR coordinator following for possible IP rehab. Will continue to follow for dc needs.    Expected Discharge Date:               Expected Discharge Plan:  IP Rehab Facility  In-House Referral:  Clinical Social Work  Discharge planning Services  CM Consult  Post Acute Care Choice:  NA Choice offered to:  NA  DME Arranged:  N/A DME Agency:  NA  HH Arranged:  NA HH Agency:  NA  Status of Service:  In process, will continue to follow  If discussed at Long Length of Stay Meetings, dates discussed:    Additional Comments:  Elliot Cousin, RN 01/10/2017, 2:53 PM

## 2017-01-10 NOTE — Progress Notes (Signed)
Modified Barium Swallow Progress Note  Patient Details  Name: Troy Brennan MRN: [id number] Date of Birth: [birth year]  Today's Date: 01/10/2017  Modified Barium Swallow completed.  Full report located under Chart Review in the Imaging Section.  Brief recommendations include the following:  Clinical Impression  Passy -Muir speaking valve donned when pt arrived in xray suite. [name] presents with a mild oral and moderate sensorimotor pharyngeal dysphagia. Very mild oral phase impairments included premature spillage with nectar thick liquids. Mild delayed swallow initiation to the valleculae and pyriform sinuses with nectar thick due to decreased sensation. Pt penetrated and aspirated during the swallow (and during 2nd swallows) with thin liquids. Initially, the penetrates/aspirates were not sensed; however, as time progressed, the pt was able to cough reflexively and clear out a majority of the aspirates. Coughing continued throughout the study. Mild vallecular and pyriform sinus residue was observed with nectar thick liquids, although he was able to clear most residue with second swallows. Pt required verbal cueing during PO intake to slow rate and take small sips. Due to aspiration risk and impulsivity, recommend Dys 3 solids, upgrade to nectar thick liquids (no straws), meds whole in puree. Wear PMSV during mealtime (and all waking hours), slow rate, small sips/bites. ST will continue to f/u for diet tolerance and education.   Swallow Evaluation Recommendations       SLP Diet Recommendations: Dysphagia 3 (Mech soft) solids;Nectar thick liquid   Liquid Administration via: Cup;No straw   Medication Administration: Whole meds with puree   Supervision: Patient able to self feed;Intermittent supervision to cue for compensatory strategies   Compensations: Slow rate;Small sips/bites   Postural Changes: Seated upright at 90 degrees   Oral Care Recommendations: Oral care BID         Royce Macadamia 01/10/2017,12:20 PM   Breck Coons Lonell Face.Ed [employer] [phone]

## 2017-01-10 NOTE — Progress Notes (Addendum)
PROGRESS NOTE    Troy Brennan  PFX:[id number] DOB: May 15, 1971 DOA: 12/21/2016 PCP: No PCP Per Patient    Brief Narrative:  46 year old male with ETOH and polysubstance abuse who was discharged from a detox facility 8-10 days prior to admit, who was found in cardiac arrest on 12/21/16. ROSCwas achieved about 15 minutes later. He was admitted to the ICU and treated for influenza B w/ superimposed bacterial pneumonia along with ARDS.  He required prone ventilation and chemical paralytics. Lurline Idol was placed by PCCM on 12/29/2016.  He was transferred to the stepdown unit on 12/31/2016.  On transfer to the medical floor, the patient was noted to be persistently febrile. Repeat CXR was notable for R sided pna. Patient was continued on IV abx to cover VAP. Pt has remained afebrile since.  Assessment & Plan:   Active Problems:   Cardiac arrest ([place])   Acute encephalopathy   Acute respiratory failure (H[facility])   Pneumonia   Ventilator dependent ([place])   Encounter for central line placement   Pressure injury of skin   Anoxic brain injury ([place])   Anoxic encephalopathy (H[facility])   Aspiration of food   SBO (small bowel obstruction)   Acute systolic heart failure (HCC)   Substance abuse   SIRS (systemic inflammatory response syndrome) (HCC)   Fever   Leukocytosis   Tachypnea   Hyperglycemia   Agitation  Acute Hypoxic Resp Failure - Influenza B + PNA + ARDS Pt now s/p trach while in ICU - pt appears stabilized from respiratory standpoint  - Remains on room air - Pulmonary continues to follow for trach management  MRSA PNA  Pt had completed 10 days of Vanc -Pt had remained febrile since transfer to floor -Repeat CXR with mild R basilar opacity concerning for possible pneumonia. CXR personally reviewed, appears to have somewhat improved aeration compared to prior cxr on 2/28 - Patient reports continued productive cough - Patient has been continued on abx, to cover HCAP/VAP - Afebrile  overnight since resuming abx. Consider transition to PO abx to cover possible VAP in the next 24hrs  Anoxic Encephalopathy  Reportedly still displaying some cognitive deficiencies when working w/ therapy  - currently stable and appropriately conversant  Acute Systolic CHF s/p Cardiac arrest  EF 15-20% - no evidence of signif volume overload at this time - follow Is/Os -Remains stable at this time  Acute urinary retention  Foley d/c'd on transfer to floor Patient continues to void well thus far Will continue to monitor  Dry gangrene R hand  Currently remains dry on exam - pt is aware of risk of loosing parts of his fingers if worsens  Profuse diarrhea Initially required Flexiseal, since discontinued - resolved -Suspect related to tube feeds that were briefly given  Thrombocytopenia secondary to severe acute illness - Labs reviewed. Normalized - Follow up CBC in AM  Hypernatremia  Resolved  - Will recheck bmet in AM  Debility Given this patients profound life threatening event, and the resultant anoxic brain injury as well as severe systolic CHF, Dr. Thereasa Solo did not expect this patient will be physically capable of work for at least 12 months - PT/OT had been consulted with recs for CIR vs SNF - CIR officially consulted. If unable to arrange CIR, then would plan SNF  DVT prophylaxis: SCD's Code Status: DNR Family Communication: Pt in room, family not at bedside Disposition Plan: Uncertain at this time  Consultants:   PCCM  Neurology  Procedures:  Trach placement 2/22  Antimicrobials: Anti-infectives    Start     Dose/Rate Route Frequency Ordered Stop   01/09/17 1700  vancomycin (VANCOCIN) IVPB 750 mg/150 ml premix     750 mg 150 mL/hr over 60 Minutes Intravenous Every 8 hours 01/09/17 1559     01/09/17 1630  ceFEPIme (MAXIPIME) 1 g in dextrose 5 % 50 mL IVPB     1 g 100 mL/hr over 30 Minutes Intravenous Every 8 hours 01/09/17 1535 01/17/17 1359    01/04/17 1800  vancomycin (VANCOCIN) IVPB 750 mg/150 ml premix     750 mg 150 mL/hr over 60 Minutes Intravenous Every 8 hours 01/04/17 1739 01/06/17 1821   01/03/17 1430  cefTAZidime (FORTAZ) 1 g in dextrose 5 % 50 mL IVPB  Status:  Discontinued     1 g 100 mL/hr over 30 Minutes Intravenous Every 8 hours 01/03/17 1359 01/04/17 1555   12/28/16 2030  vancomycin (VANCOCIN) IVPB 750 mg/150 ml premix     750 mg 150 mL/hr over 60 Minutes Intravenous Every 8 hours 12/28/16 1127 01/03/17 2230   12/28/16 1230  vancomycin (VANCOCIN) 1,500 mg in sodium chloride 0.9 % 500 mL IVPB     1,500 mg 250 mL/hr over 120 Minutes Intravenous  Once 12/28/16 1127 12/28/16 1448   12/23/16 1000  oseltamivir (TAMIFLU) 6 MG/ML suspension 75 mg  Status:  Discontinued     75 mg Per Tube 2 times daily 12/23/16 0928 12/26/16 1030   12/22/16 0900  cefTAZidime (FORTAZ) 1 g in dextrose 5 % 50 mL IVPB  Status:  Discontinued     1 g 100 mL/hr over 30 Minutes Intravenous Every 8 hours 12/22/16 0857 12/29/16 0923   12/22/16 0400  vancomycin (VANCOCIN) 1,250 mg in sodium chloride 0.9 % 250 mL IVPB  Status:  Discontinued     1,250 mg 166.7 mL/hr over 90 Minutes Intravenous Every 24 hours 12/21/16 0738 12/23/16 0839   12/21/16 1300  piperacillin-tazobactam (ZOSYN) IVPB 3.375 g  Status:  Discontinued     3.375 g 12.5 mL/hr over 240 Minutes Intravenous Every 8 hours 12/21/16 0738 12/21/16 0753   12/21/16 1000  oseltamivir (TAMIFLU) 6 MG/ML suspension 75 mg  Status:  Discontinued     75 mg Per Tube Daily 12/21/16 0802 12/23/16 0928   12/21/16 0800  cefTRIAXone (ROCEPHIN) 2 g in dextrose 5 % 50 mL IVPB  Status:  Discontinued     2 g 100 mL/hr over 30 Minutes Intravenous Every 24 hours 12/21/16 0754 12/22/16 0857   12/21/16 0615  piperacillin-tazobactam (ZOSYN) IVPB 3.375 g     3.375 g 100 mL/hr over 30 Minutes Intravenous  Once 12/21/16 0603 12/21/16 0648   12/21/16 0615  vancomycin (VANCOCIN) IVPB 1000 mg/200 mL premix     1,000  mg 200 mL/hr over 60 Minutes Intravenous  Once 12/21/16 0603 12/21/16 0746      Subjective: Without complaints. Eager for rehab  Objective: Vitals:   01/09/17 2222 01/10/17 0201 01/10/17 0252 01/10/17 0502  BP: 115/77 116/79  111/67  Pulse: 79 82  81  Resp: _0 Temp: 99.4 F (37.4 C) 99.6 F (37.6 C)  99.4 F (37.4 C)  TempSrc: Oral Oral  Oral  SpO2: 96% 95%  96%  Weight:   73 kg (160 lb 15 oz)   Height:        Intake/Output Summary (Last 24 hours) at 01/10/17 1338 Last data filed at 01/10/17 0700  Gross per 24 hour  Intake              520 ml  Output              300 ml  Net              220 ml   Filed Weights   01/07/17 0500 01/09/17 0758 01/10/17 0252  Weight: 75 kg (165 lb 5.5 oz) 70.7 kg (155 lb 14.4 oz) 73 kg (160 lb 15 oz)    Examination:  General exam: conversant, laying in bed, in nad Respiratory system: normal chest rise, trach in place, no wheezing on auscultation Cardiovascular system: regular rhythm, s1-2 on exma Gastrointestinal system: nontender, pos bs, nondistended Central nervous system: no seizures, no tremors Extremities: no cyanosis, no joint deformities Skin: no rashes, no pallor, multiple tattoos Psychiatry: affect normal// no auditory hallucinations   Data Reviewed: I have personally reviewed following labs and imaging studies  CBC:  Recent Labs Lab 01/05/17 0219 01/07/17 0248  WBC 8.1 7.5  HGB 12.0* 10.9*  HCT 37.1* 33.9*  MCV 86.5 85.8  PLT 247 710   Basic Metabolic Panel:  Recent Labs Lab 01/04/17 0421 01/05/17 0219 01/06/17 0225 01/07/17 0248 01/09/17 0531  NA 135 136 137 132* 132*  K 3.3* 3.0* 3.4* 3.6 3.6  CL 99* 101 100* 99* 102  CO2 _0 GLUCOSE 91 115* 106* 98 91  BUN 30* 27* 22* 14 10  CREATININE 0.85 0.93 0.80 0.75 0.74  CALCIUM 8.6* 8.3* 8.5* 8.2* 8.2*  MG  --   --  1.9  --   --    GFR: Estimated Creatinine Clearance: 120.4 mL/min (by C-G formula based on SCr of 0.74 mg/dL). Liver  Function Tests:  Recent Labs Lab 01/05/17 0219  AST 44*  ALT 47  ALKPHOS 54  BILITOT 1.5*  PROT 6.9  ALBUMIN 2.2*   No results for input(s): LIPASE, AMYLASE in the last 168 hours. No results for input(s): AMMONIA in the last 168 hours. Coagulation Profile: No results for input(s): INR, PROTIME in the last 168 hours. Cardiac Enzymes: No results for input(s): CKTOTAL, CKMB, CKMBINDEX, TROPONINI in the last 168 hours. BNP (last 3 results) No results for input(s): PROBNP in the last 8760 hours. HbA1C: No results for input(s): HGBA1C in the last 72 hours. CBG:  Recent Labs Lab 01/07/17 0429 01/07/17 0748 01/07/17 1218 01/07/17 1655 01/07/17 1930  GLUCAP 94 96 92 87 104*   Lipid Profile: No results for input(s): CHOL, HDL, LDLCALC, TRIG, CHOLHDL, LDLDIRECT in the last 72 hours. Thyroid Function Tests: No results for input(s): TSH, T4TOTAL, FREET4, T3FREE, THYROIDAB in the last 72 hours. Anemia Panel: No results for input(s): VITAMINB12, FOLATE, FERRITIN, TIBC, IRON, RETICCTPCT in the last 72 hours. Sepsis Labs:  Recent Labs Lab 01/04/17 1030  PROCALCITON 0.57  LATICACIDVEN 0.9    Recent Results (from the past 240 hour(s))  Culture, blood (routine x 2)     Status: None   Collection Time: 01/02/17  6:01 AM  Result Value Ref Range Status   Specimen Description BLOOD BLOOD LEFT HAND  Final   Special Requests IN PEDIATRIC BOTTLE 1CC  Final   Culture NO GROWTH 5 DAYS  Final   Report Status 01/07/2017 FINAL  Final  Culture, blood (routine x 2)     Status: None   Collection Time: 01/02/17  6:03 AM  Result Value Ref Range Status   Specimen Description BLOOD BLOOD RIGHT HAND  Final  Special Requests IN PEDIATRIC BOTTLE 1CC  Final   Culture NO GROWTH 5 DAYS  Final   Report Status 01/07/2017 FINAL  Final  Culture, respiratory (NON-Expectorated)     Status: None   Collection Time: 01/04/17  9:40 AM  Result Value Ref Range Status   Specimen Description TRACHEAL ASPIRATE   Final   Special Requests NONE  Final   Gram Stain   Final    MODERATE WBC PRESENT, PREDOMINANTLY PMN RARE SQUAMOUS EPITHELIAL CELLS PRESENT MODERATE GRAM NEGATIVE RODS FEW GRAM POSITIVE COCCI IN PAIRS    Culture   Final    MODERATE METHICILLIN RESISTANT STAPHYLOCOCCUS AUREUS   Report Status 01/06/2017 FINAL  Final   Organism ID, Bacteria METHICILLIN RESISTANT STAPHYLOCOCCUS AUREUS  Final      Susceptibility   Methicillin resistant staphylococcus aureus - MIC*    CIPROFLOXACIN <=0.5 SENSITIVE Sensitive     ERYTHROMYCIN <=0.25 SENSITIVE Sensitive     GENTAMICIN <=0.5 SENSITIVE Sensitive     OXACILLIN >=4 RESISTANT Resistant     TETRACYCLINE <=1 SENSITIVE Sensitive     VANCOMYCIN 1 SENSITIVE Sensitive     TRIMETH/SULFA <=10 SENSITIVE Sensitive     CLINDAMYCIN <=0.25 SENSITIVE Sensitive     RIFAMPIN <=0.5 SENSITIVE Sensitive     Inducible Clindamycin NEGATIVE Sensitive     * MODERATE METHICILLIN RESISTANT STAPHYLOCOCCUS AUREUS     Radiology Studies: Dg Chest Port 1 View  Result Date: 01/08/2017 CLINICAL DATA:  Fever. EXAM: PORTABLE CHEST 1 VIEW COMPARISON:  Radiograph of January 04, 2017. FINDINGS: The heart size and mediastinal contours are within normal limits. Tracheostomy is in grossly good position. No pneumothorax or pleural effusion is noted. Left lung is clear. Mild right basilar opacity is noted concerning for possible pneumonia. The visualized skeletal structures are unremarkable. IMPRESSION: Mild right basilar opacity concerning for possible pneumonia. Followup PA and lateral chest X-ray is recommended in 3-4 weeks following trial of antibiotic therapy to ensure resolution and exclude underlying malignancy. Electronically Signed   By: Marijo Conception, M.D.   On: 01/08/2017 15:17   Dg Swallowing Func-speech Pathology  Result Date: 01/10/2017 Objective Swallowing Evaluation: Type of Study: MBS-Modified Barium Swallow Study Patient Details Name: Troy Brennan MRN:  [id number] Date of Birth: May 18, 1971 Today's Date: 01/10/2017 Time: SLP Start Time (ACUTE ONLY): 1011-SLP Stop Time (ACUTE ONLY): 1033 SLP Time Calculation (min) (ACUTE ONLY): 22 min Past Medical History: No past medical history on file. Past Surgical History: No past surgical history on file. HPI: 46 year old male with ETOH and polysubstance abuse, recently discharged from detox facility 8-10 days prior to admit. Found in cardiac arrest, septic, normothermia, ARDS. Found to have anoxic encephalopathy and bilateral basal ganglia stroke. Intubated from 2/14 to 2/22 when trach was placed. Pt currently consuming honey thick liquids. MBS today for possibility of decreased liquid viscosity. Subjective: Alert, cooperative, follows commands Assessment / Plan / Recommendation CHL IP CLINICAL IMPRESSIONS 01/10/2017 Clinical Impression Passy -Muir speaking valve donned when pt arrived in xray suite. Mr. Thain presents with a mild oral and moderate sensorimotor pharyngeal dysphagia. Very mild oral phase impairments included premature spillage with nectar thick liquids. Mild delayed swallow initiation to the valleculae and pyriform sinuses with nectar thick due to decreased sensation. Pt penetrated and aspirated during the swallow (and during 2nd swallows) with thin liquids. Initially, the penetrates/aspirates were not sensed; however, as time progressed, the pt was able to cough reflexively and clear out a majority of the aspirates. Coughing continued throughout the  study. Mild vallecular and pyriform sinus residue was observed with nectar thick liquids, although he was able to clear most residue with second swallows. Pt required verbal cueing during PO intake to slow rate and take small sips. Due to aspiration risk and impulsivity, recommend Dys 3 solids, upgrade to nectar thick liquids (no straws), meds whole in puree. Wear PMSV during mealtime (and all waking hours), slow rate, small sips/bites. ST will continue to f/u for  diet tolerance and education. SLP Visit Diagnosis Dysphagia, oral phase (R13.11);Dysphagia, pharyngeal phase (R13.13) Attention and concentration deficit following -- Frontal lobe and executive function deficit following -- Impact on safety and function Moderate aspiration risk   CHL IP TREATMENT RECOMMENDATION 01/10/2017 Treatment Recommendations Therapy as outlined in treatment plan below   Prognosis 01/10/2017 Prognosis for Safe Diet Advancement Good Barriers to Reach Goals -- Barriers/Prognosis Comment -- CHL IP DIET RECOMMENDATION 01/10/2017 SLP Diet Recommendations Dysphagia 3 (Mech soft) solids;Nectar thick liquid Liquid Administration via Cup;No straw Medication Administration Whole meds with puree Compensations Slow rate;Small sips/bites Postural Changes Seated upright at 90 degrees   CHL IP OTHER RECOMMENDATIONS 01/10/2017 Recommended Consults -- Oral Care Recommendations Oral care BID Other Recommendations --   CHL IP FOLLOW UP RECOMMENDATIONS 01/10/2017 Follow up Recommendations Skilled Nursing facility   [facility] IP FREQUENCY AND DURATION 01/10/2017 Speech Therapy Frequency (ACUTE ONLY) min 2x/week Treatment Duration 2 weeks      CHL IP ORAL PHASE 01/10/2017 Oral Phase Impaired Oral - Pudding Teaspoon -- Oral - Pudding Cup -- Oral - Honey Teaspoon -- Oral - Honey Cup -- Oral - Nectar Teaspoon -- Oral - Nectar Cup Premature spillage Oral - Nectar Straw Premature spillage Oral - Thin Teaspoon -- Oral - Thin Cup WFL Oral - Thin Straw -- Oral - Puree -- Oral - Mech Soft -- Oral - Regular WFL Oral - Multi-Consistency -- Oral - Pill -- Oral Phase - Comment --  CHL IP PHARYNGEAL PHASE 01/10/2017 Pharyngeal Phase Impaired Pharyngeal- Pudding Teaspoon -- Pharyngeal -- Pharyngeal- Pudding Cup -- Pharyngeal -- Pharyngeal- Honey Teaspoon -- Pharyngeal -- Pharyngeal- Honey Cup -- Pharyngeal -- Pharyngeal- Nectar Teaspoon -- Pharyngeal -- Pharyngeal- Nectar Cup Delayed swallow initiation-pyriform sinuses;Delayed swallow  initiation-vallecula;Pharyngeal residue - pyriform;Reduced tongue base retraction Pharyngeal -- Pharyngeal- Nectar Straw Pharyngeal residue - valleculae;Penetration/Apiration after swallow Pharyngeal Material enters airway, remains ABOVE vocal cords and not ejected out Pharyngeal- Thin Teaspoon -- Pharyngeal -- Pharyngeal- Thin Cup Penetration/Aspiration during swallow;Reduced airway/laryngeal closure Pharyngeal Material enters airway, passes BELOW cords without attempt by patient to eject out (silent aspiration);Material enters airway, passes BELOW cords then ejected out Pharyngeal- Thin Straw -- Pharyngeal -- Pharyngeal- Puree -- Pharyngeal -- Pharyngeal- Mechanical Soft -- Pharyngeal -- Pharyngeal- Regular WFL Pharyngeal -- Pharyngeal- Multi-consistency -- Pharyngeal -- Pharyngeal- Pill -- Pharyngeal -- Pharyngeal Comment --  CHL IP CERVICAL ESOPHAGEAL PHASE 01/10/2017 Cervical Esophageal Phase WFL Pudding Teaspoon -- Pudding Cup -- Honey Teaspoon -- Honey Cup -- Nectar Teaspoon -- Nectar Cup -- Nectar Straw -- Thin Teaspoon -- Thin Cup -- Thin Straw -- Puree -- Mechanical Soft -- Regular -- Multi-consistency -- Pill -- Cervical Esophageal Comment -- No flowsheet data found. [place] Siren 01/10/2017, 12:20 PM Orbie Pyo Colvin Caroli.Ed CCC-SLP Pager [phone]               Scheduled Meds: . aspirin  81 mg Oral Daily  . ceFEPime (MAXIPIME) IV  1 g Intravenous Q8H  . chlorhexidine gluconate (MEDLINE KIT)  15 mL Mouth Rinse BID  . fentaNYL  12.5 mcg Transdermal Q72H  . metoprolol tartrate  50 mg Oral BID  . pantoprazole sodium  40 mg Oral QHS  . vancomycin  750 mg Intravenous Q8H   Continuous Infusions:   LOS: 20 days   Adrieanna Boteler, Orpah Melter, MD [facility] Pager [phone]  If 7PM-7AM, please contact night-coverage [email or web address] Password [email or web address] 01/10/2017, 1:38 PM

## 2017-01-11 LAB — BASIC METABOLIC PANEL
Anion gap: 10 (ref 5–15)
Anion gap: 8 (ref 5–15)
BUN: 7 mg/dL (ref 6–20)
BUN: 7 mg/dL (ref 6–20)
CALCIUM: 8.5 mg/dL — AB (ref 8.9–10.3)
CALCIUM: 8.5 mg/dL — AB (ref 8.9–10.3)
CHLORIDE: 101 mmol/L (ref 101–111)
CO2: 22 mmol/L (ref 22–32)
CO2: 24 mmol/L (ref 22–32)
CREATININE: 0.76 mg/dL (ref 0.61–1.24)
Chloride: 101 mmol/L (ref 101–111)
Creatinine, Ser: 0.76 mg/dL (ref 0.61–1.24)
GFR calc non Af Amer: >60 mL/min (ref >60)
Glucose, Bld: 107 mg/dL — ABNORMAL HIGH (ref 65–99)
Glucose, Bld: 93 mg/dL (ref 65–99)
Potassium: 3.5 mmol/L (ref 3.5–5.1)
Potassium: 3.7 mmol/L (ref 3.5–5.1)
SODIUM: 133 mmol/L — AB (ref 135–145)
SODIUM: 133 mmol/L — AB (ref 135–145)

## 2017-01-11 MED ORDER — LEVOFLOXACIN 500 MG PO TABS
500.0000 mg | ORAL_TABLET | ORAL | Status: DC
  Administered 2017-01-11 – 2017-01-12 (×2): 500 mg via ORAL
  Filled 2017-01-11 (×2): qty 1

## 2017-01-11 NOTE — Progress Notes (Signed)
PROGRESS NOTE    Zain Lankford  UJW:[id number] DOB: July 23, 1971 DOA: 12/21/2016 PCP: No PCP Per Patient    Brief Narrative:  46 year old male with ETOH and polysubstance abuse who was discharged from a detox facility 8-10 days prior to admit, who was found in cardiac arrest on 12/21/16. ROSCwas achieved about 15 minutes later. He was admitted to the ICU and treated for influenza B w/ superimposed bacterial pneumonia along with ARDS.  He required prone ventilation and chemical paralytics. Lurline Idol was placed by PCCM on 12/29/2016.  He was transferred to the stepdown unit on 12/31/2016. On transfer to the medical floor, the patient was noted to be persistently febrile. Repeat CXR was notable for R sided pna. Patient was continued on IV abx to cover VAP. Pt has remained afebrile since and improving  Assessment & Plan:  Acute Hypoxic Resp Failure - Influenza B + PNA + ARDS -now with chronic resp failure, s/p prolonged VDRF -remained off the ventilator since 2/27 and trach was downsized to a #6 cuff less 01/05/2017.  -improved, awake and interactive and is tolerating ATC with PMV well -? Pneumonia and fevers back on Abx again from 3/5, now improving, afebrile, blood Cx negative -stop Vancomycin, pulm toilet, change cefepime to PO levaquin -Pulm to FU for Trach management and plan for capping trach followed by decannulation etc  MRSA PNA  -Pt had completed 10 days of Vanc in ICU -Pt was febrile since transfer to floor 3/3-3/5 -Repeat CXR with mild R basilar opacity concerning for possible pneumonia. CXR personally reviewed, appears to have somewhat improved aeration compared to prior cxr on 2/28 but with ongoing productive cough, Abx restarted for VAP/HCAP -Now afebrile, Blood Cx negative, stop Vanc and change cefepime to Levaquin  Anoxic Encephalopathy  Reportedly still displaying some cognitive deficiencies when working w/ therapy  - currently stable and appropriately  conversant -improving  Acute Systolic CHF s/p Cardiac arrest  EF 15-20% - no evidence of signif volume overload at this time - follow Is/Os -Remains stable at this time -needs cards FU with ECHO in 4-6weeks  Acute urinary retention  Foley d/c'd on transfer to floor -no issues now  Dry gangrene R hand  Currently remains dry on exam - pt is aware of risk of loosing parts of his fingers if worsens  Profuse diarrhea Initially required Flexiseal, since discontinued - resolved  Thrombocytopenia secondary to severe acute illness - Labs reviewed. Normalized  Hypernatremia  Resolved  - Will recheck bmet in AM  Debility Given this patients profound life threatening event, and the resultant anoxic brain injury as well as severe systolic CHF, Dr. Thereasa Solo did not expect this patient will be physically capable of work for at least 12 months - PT/OT had been consulted with recs for CIR vs SNF - CIR officially consulted. Stable for Discharge with ongoing Trach management at CIR by Pulm  DVT prophylaxis: SCD's Code Status: DNR Family Communication: family at bedside Disposition Plan: Uncertain at this time  Consultants:   PCCM  Neurology  Procedures:   Trach placement 2/22  Antimicrobials: Anti-infectives    Start     Dose/Rate Route Frequency Ordered Stop   01/11/17 1530  levofloxacin (LEVAQUIN) tablet 500 mg     500 mg Oral Every 24 hours 01/11/17 1456     01/09/17 1700  vancomycin (VANCOCIN) IVPB 750 mg/150 ml premix  Status:  Discontinued     750 mg 150 mL/hr over 60 Minutes Intravenous Every 8 hours 01/09/17 1559  01/11/17 1117   01/09/17 1630  ceFEPIme (MAXIPIME) 1 g in dextrose 5 % 50 mL IVPB  Status:  Discontinued     1 g 100 mL/hr over 30 Minutes Intravenous Every 8 hours 01/09/17 1535 01/11/17 1456   01/04/17 1800  vancomycin (VANCOCIN) IVPB 750 mg/150 ml premix     750 mg 150 mL/hr over 60 Minutes Intravenous Every 8 hours 01/04/17 1739 01/06/17 1821    01/03/17 1430  cefTAZidime (FORTAZ) 1 g in dextrose 5 % 50 mL IVPB  Status:  Discontinued     1 g 100 mL/hr over 30 Minutes Intravenous Every 8 hours 01/03/17 1359 01/04/17 1555   12/28/16 2030  vancomycin (VANCOCIN) IVPB 750 mg/150 ml premix     750 mg 150 mL/hr over 60 Minutes Intravenous Every 8 hours 12/28/16 1127 01/03/17 2230   12/28/16 1230  vancomycin (VANCOCIN) 1,500 mg in sodium chloride 0.9 % 500 mL IVPB     1,500 mg 250 mL/hr over 120 Minutes Intravenous  Once 12/28/16 1127 12/28/16 1448   12/23/16 1000  oseltamivir (TAMIFLU) 6 MG/ML suspension 75 mg  Status:  Discontinued     75 mg Per Tube 2 times daily 12/23/16 0928 12/26/16 1030   12/22/16 0900  cefTAZidime (FORTAZ) 1 g in dextrose 5 % 50 mL IVPB  Status:  Discontinued     1 g 100 mL/hr over 30 Minutes Intravenous Every 8 hours 12/22/16 0857 12/29/16 0923   12/22/16 0400  vancomycin (VANCOCIN) 1,250 mg in sodium chloride 0.9 % 250 mL IVPB  Status:  Discontinued     1,250 mg 166.7 mL/hr over 90 Minutes Intravenous Every 24 hours 12/21/16 0738 12/23/16 0839   12/21/16 1300  piperacillin-tazobactam (ZOSYN) IVPB 3.375 g  Status:  Discontinued     3.375 g 12.5 mL/hr over 240 Minutes Intravenous Every 8 hours 12/21/16 0738 12/21/16 0753   12/21/16 1000  oseltamivir (TAMIFLU) 6 MG/ML suspension 75 mg  Status:  Discontinued     75 mg Per Tube Daily 12/21/16 0802 12/23/16 0928   12/21/16 0800  cefTRIAXone (ROCEPHIN) 2 g in dextrose 5 % 50 mL IVPB  Status:  Discontinued     2 g 100 mL/hr over 30 Minutes Intravenous Every 24 hours 12/21/16 0754 12/22/16 0857   12/21/16 0615  piperacillin-tazobactam (ZOSYN) IVPB 3.375 g     3.375 g 100 mL/hr over 30 Minutes Intravenous  Once 12/21/16 0603 12/21/16 0648   12/21/16 0615  vancomycin (VANCOCIN) IVPB 1000 mg/200 mL premix     1,000 mg 200 mL/hr over 60 Minutes Intravenous  Once 12/21/16 0603 12/21/16 0746      Subjective: Without complaints. Eager for rehab  Objective: Vitals:    01/10/17 2158 01/11/17 0618 01/11/17 0626 01/11/17 1419  BP: 120/75  120/75 115/70  Pulse: 73  76 73  Resp: _0 Temp: 97.5 F (36.4 C)  98.8 F (37.1 C) 98.9 F (37.2 C)  TempSrc: Oral  Oral Oral  SpO2: 97%  94% 97%  Weight:  69.1 kg (152 lb 6.4 oz)    Height:        Intake/Output Summary (Last 24 hours) at 01/11/17 1547 Last data filed at 01/11/17 0620  Gross per 24 hour  Intake              680 ml  Output              600 ml  Net  80 ml   Filed Weights   01/09/17 0758 01/10/17 0252 01/11/17 0618  Weight: 70.7 kg (155 lb 14.4 oz) 73 kg (160 lb 15 oz) 69.1 kg (152 lb 6.4 oz)    Examination:  General exam: conversant, laying in bed, in nad Respiratory system: normal chest rise, trach in place, no wheezing on auscultation Cardiovascular system: regular rhythm, s1-2 on exma Gastrointestinal system: nontender, pos bs, nondistended Central nervous system: no seizures, no tremors Extremities: no cyanosis, no joint deformities Skin: no rashes, no pallor, multiple tattoos Psychiatry: affect normal// no auditory hallucinations   Data Reviewed: I have personally reviewed following labs and imaging studies  CBC:  Recent Labs Lab 01/05/17 0219 01/07/17 0248  WBC 8.1 7.5  HGB 12.0* 10.9*  HCT 37.1* 33.9*  MCV 86.5 85.8  PLT 247 195   Basic Metabolic Panel:  Recent Labs Lab 01/06/17 0225 01/07/17 0248 01/09/17 0531 01/11/17 0658 01/11/17 1200  NA 137 132* 132* 133* 133*  K 3.4* 3.6 3.6 3.5 3.7  CL 100* 99* 102 101 101  CO2 _0 GLUCOSE 106* 98 91 107* 93  BUN 22* _1 CREATININE 0.80 0.75 0.74 0.76 0.76  CALCIUM 8.5* 8.2* 8.2* 8.5* 8.5*  MG 1.9  --   --   --   --    GFR: Estimated Creatinine Clearance: 114 mL/min (by C-G formula based on SCr of 0.76 mg/dL). Liver Function Tests:  Recent Labs Lab 01/05/17 0219  AST 44*  ALT 47  ALKPHOS 54  BILITOT 1.5*  PROT 6.9  ALBUMIN 2.2*   No results for input(s): LIPASE,  AMYLASE in the last 168 hours. No results for input(s): AMMONIA in the last 168 hours. Coagulation Profile: No results for input(s): INR, PROTIME in the last 168 hours. Cardiac Enzymes: No results for input(s): CKTOTAL, CKMB, CKMBINDEX, TROPONINI in the last 168 hours. BNP (last 3 results) No results for input(s): PROBNP in the last 8760 hours. HbA1C: No results for input(s): HGBA1C in the last 72 hours. CBG:  Recent Labs Lab 01/07/17 0429 01/07/17 0748 01/07/17 1218 01/07/17 1655 01/07/17 1930  GLUCAP 94 96 92 87 104*   Lipid Profile: No results for input(s): CHOL, HDL, LDLCALC, TRIG, CHOLHDL, LDLDIRECT in the last 72 hours. Thyroid Function Tests: No results for input(s): TSH, T4TOTAL, FREET4, T3FREE, THYROIDAB in the last 72 hours. Anemia Panel: No results for input(s): VITAMINB12, FOLATE, FERRITIN, TIBC, IRON, RETICCTPCT in the last 72 hours. Sepsis Labs: No results for input(s): PROCALCITON, LATICACIDVEN in the last 168 hours.  Recent Results (from the past 240 hour(s))  Culture, blood (routine x 2)     Status: None   Collection Time: 01/02/17  6:01 AM  Result Value Ref Range Status   Specimen Description BLOOD BLOOD LEFT HAND  Final   Special Requests IN PEDIATRIC BOTTLE 1CC  Final   Culture NO GROWTH 5 DAYS  Final   Report Status 01/07/2017 FINAL  Final  Culture, blood (routine x 2)     Status: None   Collection Time: 01/02/17  6:03 AM  Result Value Ref Range Status   Specimen Description BLOOD BLOOD RIGHT HAND  Final   Special Requests IN PEDIATRIC BOTTLE [place]  Final   Culture NO GROWTH 5 DAYS  Final   Report Status 01/07/2017 FINAL  Final  Culture, respiratory (NON-Expectorated)     Status: None   Collection Time: 01/04/17  9:40 AM  Result Value Ref Range Status  Specimen Description TRACHEAL ASPIRATE  Final   Special Requests NONE  Final   Gram Stain   Final    MODERATE WBC PRESENT, PREDOMINANTLY PMN RARE SQUAMOUS EPITHELIAL CELLS PRESENT MODERATE GRAM  NEGATIVE RODS FEW GRAM POSITIVE COCCI IN PAIRS    Culture   Final    MODERATE METHICILLIN RESISTANT STAPHYLOCOCCUS AUREUS   Report Status 01/06/2017 FINAL  Final   Organism ID, Bacteria METHICILLIN RESISTANT STAPHYLOCOCCUS AUREUS  Final      Susceptibility   Methicillin resistant staphylococcus aureus - MIC*    CIPROFLOXACIN <=0.5 SENSITIVE Sensitive     ERYTHROMYCIN <=0.25 SENSITIVE Sensitive     GENTAMICIN <=0.5 SENSITIVE Sensitive     OXACILLIN >=4 RESISTANT Resistant     TETRACYCLINE <=1 SENSITIVE Sensitive     VANCOMYCIN 1 SENSITIVE Sensitive     TRIMETH/SULFA <=10 SENSITIVE Sensitive     CLINDAMYCIN <=0.25 SENSITIVE Sensitive     RIFAMPIN <=0.5 SENSITIVE Sensitive     Inducible Clindamycin NEGATIVE Sensitive     * MODERATE METHICILLIN RESISTANT STAPHYLOCOCCUS AUREUS  Culture, blood (routine x 2)     Status: None (Preliminary result)   Collection Time: 01/09/17 10:56 AM  Result Value Ref Range Status   Specimen Description BLOOD LEFT ANTECUBITAL  Final   Special Requests IN PEDIATRIC BOTTLE 2CC  Final   Culture NO GROWTH 2 DAYS  Final   Report Status PENDING  Incomplete  Culture, blood (routine x 2)     Status: None (Preliminary result)   Collection Time: 01/09/17 10:59 AM  Result Value Ref Range Status   Specimen Description BLOOD RIGHT ANTECUBITAL  Final   Special Requests IN PEDIATRIC BOTTLE 2CC  Final   Culture NO GROWTH 2 DAYS  Final   Report Status PENDING  Incomplete     Radiology Studies: Dg Swallowing Func-speech Pathology  Result Date: 01/10/2017 Objective Swallowing Evaluation: Type of Study: MBS-Modified Barium Swallow Study Patient Details Name: Eldrick Penick MRN: [id number] Date of Birth: 24-Sep-1971 Today's Date: 01/10/2017 Time: SLP Start Time (ACUTE ONLY): 1011-SLP Stop Time (ACUTE ONLY): 1033 SLP Time Calculation (min) (ACUTE ONLY): 22 min Past Medical History: No past medical history on file. Past Surgical History: No past surgical history on file.  HPI: 46 year old male with ETOH and polysubstance abuse, recently discharged from detox facility 8-10 days prior to admit. Found in cardiac arrest, septic, normothermia, ARDS. Found to have anoxic encephalopathy and bilateral basal ganglia stroke. Intubated from 2/14 to 2/22 when trach was placed. Pt currently consuming honey thick liquids. MBS today for possibility of decreased liquid viscosity. Subjective: Alert, cooperative, follows commands Assessment / Plan / Recommendation CHL IP CLINICAL IMPRESSIONS 01/10/2017 Clinical Impression Passy -Muir speaking valve donned when pt arrived in xray suite. Mr. Rolph presents with a mild oral and moderate sensorimotor pharyngeal dysphagia. Very mild oral phase impairments included premature spillage with nectar thick liquids. Mild delayed swallow initiation to the valleculae and pyriform sinuses with nectar thick due to decreased sensation. Pt penetrated and aspirated during the swallow (and during 2nd swallows) with thin liquids. Initially, the penetrates/aspirates were not sensed; however, as time progressed, the pt was able to cough reflexively and clear out a majority of the aspirates. Coughing continued throughout the study. Mild vallecular and pyriform sinus residue was observed with nectar thick liquids, although he was able to clear most residue with second swallows. Pt required verbal cueing during PO intake to slow rate and take small sips. Due to aspiration risk and impulsivity, recommend  Dys 3 solids, upgrade to nectar thick liquids (no straws), meds whole in puree. Wear PMSV during mealtime (and all waking hours), slow rate, small sips/bites. ST will continue to f/u for diet tolerance and education. SLP Visit Diagnosis Dysphagia, oral phase (R13.11);Dysphagia, pharyngeal phase (R13.13) Attention and concentration deficit following -- Frontal lobe and executive function deficit following -- Impact on safety and function Moderate aspiration risk   CHL IP  TREATMENT RECOMMENDATION 01/10/2017 Treatment Recommendations Therapy as outlined in treatment plan below   Prognosis 01/10/2017 Prognosis for Safe Diet Advancement Good Barriers to Reach Goals -- Barriers/Prognosis Comment -- CHL IP DIET RECOMMENDATION 01/10/2017 SLP Diet Recommendations Dysphagia 3 (Mech soft) solids;Nectar thick liquid Liquid Administration via Cup;No straw Medication Administration Whole meds with puree Compensations Slow rate;Small sips/bites Postural Changes Seated upright at 90 degrees   CHL IP OTHER RECOMMENDATIONS 01/10/2017 Recommended Consults -- Oral Care Recommendations Oral care BID Other Recommendations --   CHL IP FOLLOW UP RECOMMENDATIONS 01/10/2017 Follow up Recommendations Skilled Nursing facility   [facility] IP FREQUENCY AND DURATION 01/10/2017 Speech Therapy Frequency (ACUTE ONLY) min 2x/week Treatment Duration 2 weeks      CHL IP ORAL PHASE 01/10/2017 Oral Phase Impaired Oral - Pudding Teaspoon -- Oral - Pudding Cup -- Oral - Honey Teaspoon -- Oral - Honey Cup -- Oral - Nectar Teaspoon -- Oral - Nectar Cup Premature spillage Oral - Nectar Straw Premature spillage Oral - Thin Teaspoon -- Oral - Thin Cup WFL Oral - Thin Straw -- Oral - Puree -- Oral - Mech Soft -- Oral - Regular WFL Oral - Multi-Consistency -- Oral - Pill -- Oral Phase - Comment --  CHL IP PHARYNGEAL PHASE 01/10/2017 Pharyngeal Phase Impaired Pharyngeal- Pudding Teaspoon -- Pharyngeal -- Pharyngeal- Pudding Cup -- Pharyngeal -- Pharyngeal- Honey Teaspoon -- Pharyngeal -- Pharyngeal- Honey Cup -- Pharyngeal -- Pharyngeal- Nectar Teaspoon -- Pharyngeal -- Pharyngeal- Nectar Cup Delayed swallow initiation-pyriform sinuses;Delayed swallow initiation-vallecula;Pharyngeal residue - pyriform;Reduced tongue base retraction Pharyngeal -- Pharyngeal- Nectar Straw Pharyngeal residue - valleculae;Penetration/Apiration after swallow Pharyngeal Material enters airway, remains ABOVE vocal cords and not ejected out Pharyngeal- Thin Teaspoon --  Pharyngeal -- Pharyngeal- Thin Cup Penetration/Aspiration during swallow;Reduced airway/laryngeal closure Pharyngeal Material enters airway, passes BELOW cords without attempt by patient to eject out (silent aspiration);Material enters airway, passes BELOW cords then ejected out Pharyngeal- Thin Straw -- Pharyngeal -- Pharyngeal- Puree -- Pharyngeal -- Pharyngeal- Mechanical Soft -- Pharyngeal -- Pharyngeal- Regular WFL Pharyngeal -- Pharyngeal- Multi-consistency -- Pharyngeal -- Pharyngeal- Pill -- Pharyngeal -- Pharyngeal Comment --  CHL IP CERVICAL ESOPHAGEAL PHASE 01/10/2017 Cervical Esophageal Phase WFL Pudding Teaspoon -- Pudding Cup -- Honey Teaspoon -- Honey Cup -- Nectar Teaspoon -- Nectar Cup -- Nectar Straw -- Thin Teaspoon -- Thin Cup -- Thin Straw -- Puree -- Mechanical Soft -- Regular -- Multi-consistency -- Pill -- Cervical Esophageal Comment -- No flowsheet data found. [place] Siren 01/10/2017, 12:20 PM Orbie Pyo Colvin Caroli.Ed CCC-SLP Pager [phone]               Scheduled Meds: . aspirin  81 mg Oral Daily  . chlorhexidine gluconate (MEDLINE KIT)  15 mL Mouth Rinse BID  . fentaNYL  12.5 mcg Transdermal Q72H  . levofloxacin  500 mg Oral Q24H  . metoprolol tartrate  50 mg Oral BID  . pantoprazole sodium  40 mg Oral QHS   Continuous Infusions:   LOS: 21 days   Domenic Polite, MD [facility] Pager [phone]  If 7PM-7AM, please contact night-coverage [email or web address]  Password [email or web address] 01/11/2017, 3:47 PM

## 2017-01-11 NOTE — Progress Notes (Signed)
Rehab admissions - I met with patient's mom today.  She will be available to provide supervision and light assistance for patient after potential rehab stay.  I tried to get a bed on rehab today, but I did not have a bed open for patient today.  I will check back in am for bed availability.  Call me for questions.  #[phone]

## 2017-01-11 NOTE — Progress Notes (Signed)
Pt is not compliant with honey thick liquids, I caught pt drinking reqular sprit brought from home by pt family, he refused for me to take it away from the room, on call physician Craige Cotta has been paged about pt non compliance, will continue to monitor
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
## 2017-01-11 NOTE — Progress Notes (Signed)
Occupational Therapy Treatment  Patient Details Name: Troy Brennan MRN: [id number] DOB: [birth year] Today's Date: 01/11/2017    History of present illness 46 year old male with ETOH and polysubstance abuse, recently discharged from detox facility 8-10 days prior to admit. Found in cardiac arrest, septic, normothermia, ARDS.   OT comments  Pt currently overall min guard for LB dressing, grooming activities in standing, functional mobility in room with continued unsteadiness in standing. Pt with decreased awareness of deficits and poor safety awareness. Pt noted to be drinking thin liquids with breakfast with increased secretions from trach; RN aware. Continue to feel CIR would be appropriate to maximize independence and safety with ADL and functional mobility prior to return home. Will continue to follow acutely.   Follow Up Recommendations  CIR;Supervision/Assistance - 24 hour    Equipment Recommendations  None recommended by OT    Recommendations for Other Services      Precautions / Restrictions Precautions Precautions: Fall Precaution Comments: trach Restrictions Weight Bearing Restrictions: No       Mobility Bed Mobility               General bed mobility comments: Pt sitting EOB upon arrival.  Transfers Overall transfer level: Needs assistance Equipment used: None Transfers: Sit to/from Stand Sit to Stand: Min guard         General transfer comment: Min guard for safety due to unsteadiness in standing    Balance Overall balance assessment: Needs assistance Sitting-balance support: Feet supported;No upper extremity supported Sitting balance-Leahy Scale: Good     Standing balance support: No upper extremity supported;During functional activity Standing balance-Leahy Scale: Fair                     ADL Overall ADL's : Needs assistance/impaired Eating/Feeding: Set up;Sitting Eating/Feeding Details (indicate cue type and reason): Pt eating  breakfast upon arrival. Noted to be drinking thin liquid (RN aware) Grooming: Min guard;Standing;Wash/dry face               Lower Body Dressing: Min guard;Sit to/from stand Lower Body Dressing Details (indicate cue type and reason): Pt able to gather socks in room at min guard and don in sitting without physical assist. Toilet Transfer: Min guard;Ambulation;Regular Teacher, adult education Details (indicate cue type and reason): Simulated by sit to stand and functional mobility in room. Pt unsteady thrhoughout but without major LOB this session.         Functional mobility during ADLs: Min guard        Vision                     Perception     Praxis      Cognition   Behavior During Therapy: Flat affect Overall Cognitive Status: Impaired/Different from baseline Area of Impairment: Safety/judgement          Safety/Judgement: Decreased awareness of safety;Decreased awareness of deficits            Exercises     Shoulder Instructions       General Comments      Pertinent Vitals/ Pain       Pain Assessment: No/denies pain  Home Living                                          Prior Functioning/Environment  Frequency  Min 2X/week        Progress Toward Goals  OT Goals(current goals can now be found in the care plan section)  Progress towards OT goals: Progressing toward goals  Acute Rehab OT Goals Patient Stated Goal: to return home with his mother  OT Goal Formulation: With patient  Plan Discharge plan remains appropriate    Co-evaluation                 End of Session    OT Visit Diagnosis: Unsteadiness on feet (R26.81)   Activity Tolerance Patient tolerated treatment well   Patient Left in chair;with call bell/phone within reach   Nurse Communication Mobility status;Other (comment) (pt drinking thin liquids)        Time: 1610-9604 OT Time Calculation (min): 13 min  Charges: OT  General Charges $OT Visit: 1 Procedure OT Treatments $Self Care/Home Management : 8-22 mins  Ashiya Kinkead A. Brett Albino, M.S., OTR/L Pager: [phone]   Gaye Alken 01/11/2017, 9:09 AM

## 2017-01-12 ENCOUNTER — Encounter (HOSPITAL_COMMUNITY): Payer: Self-pay | Admitting: Physical Medicine and Rehabilitation

## 2017-01-12 ENCOUNTER — Inpatient Hospital Stay (HOSPITAL_COMMUNITY)
Admission: RE | Admit: 2017-01-12 | Discharge: 2017-01-20 | DRG: 884 | Disposition: A | Discharge status: Home / Self Care | Payer: Self-pay | Source: Intra-hospital | Attending: Physical Medicine & Rehabilitation | Admitting: Physical Medicine & Rehabilitation

## 2017-01-12 DIAGNOSIS — R1313 Dysphagia, pharyngeal phase: Secondary | ICD-10-CM | POA: Diagnosis present

## 2017-01-12 DIAGNOSIS — Z79899 Other long term (current) drug therapy: Secondary | ICD-10-CM

## 2017-01-12 DIAGNOSIS — R4189 Other symptoms and signs involving cognitive functions and awareness: Principal | ICD-10-CM | POA: Diagnosis present

## 2017-01-12 DIAGNOSIS — F1910 Other psychoactive substance abuse, uncomplicated: Secondary | ICD-10-CM | POA: Diagnosis present

## 2017-01-12 DIAGNOSIS — I96 Gangrene, not elsewhere classified: Secondary | ICD-10-CM | POA: Diagnosis present

## 2017-01-12 DIAGNOSIS — F17210 Nicotine dependence, cigarettes, uncomplicated: Secondary | ICD-10-CM | POA: Diagnosis present

## 2017-01-12 DIAGNOSIS — D72829 Elevated white blood cell count, unspecified: Secondary | ICD-10-CM | POA: Diagnosis present

## 2017-01-12 DIAGNOSIS — Z8674 Personal history of sudden cardiac arrest: Secondary | ICD-10-CM

## 2017-01-12 DIAGNOSIS — J690 Pneumonitis due to inhalation of food and vomit: Secondary | ICD-10-CM | POA: Diagnosis present

## 2017-01-12 DIAGNOSIS — J9600 Acute respiratory failure, unspecified whether with hypoxia or hypercapnia: Secondary | ICD-10-CM | POA: Diagnosis present

## 2017-01-12 DIAGNOSIS — Z9119 Patient's noncompliance with other medical treatment and regimen: Secondary | ICD-10-CM

## 2017-01-12 DIAGNOSIS — G931 Anoxic brain damage, not elsewhere classified: Secondary | ICD-10-CM | POA: Diagnosis present

## 2017-01-12 DIAGNOSIS — Z930 Tracheostomy status: Secondary | ICD-10-CM

## 2017-01-12 DIAGNOSIS — Y95 Nosocomial condition: Secondary | ICD-10-CM | POA: Diagnosis present

## 2017-01-12 DIAGNOSIS — Z23 Encounter for immunization: Secondary | ICD-10-CM

## 2017-01-12 DIAGNOSIS — E871 Hypo-osmolality and hyponatremia: Secondary | ICD-10-CM | POA: Diagnosis present

## 2017-01-12 LAB — CBC
HEMATOCRIT: 34.4 % — AB (ref 39.0–52.0)
Hemoglobin: 11.2 g/dL — ABNORMAL LOW (ref 13.0–17.0)
MCH: 27.6 pg (ref 26.0–34.0)
MCHC: 32.6 g/dL (ref 30.0–36.0)
MCV: 84.7 fL (ref 78.0–100.0)
PLATELETS: 287 10*3/uL (ref 150–400)
RBC: 4.06 MIL/uL — AB (ref 4.22–5.81)
RDW: 12.8 % (ref 11.5–15.5)
WBC: 8.4 10*3/uL (ref 4.0–10.5)

## 2017-01-12 MED ORDER — LEVOFLOXACIN 500 MG PO TABS
500.0000 mg | ORAL_TABLET | ORAL | Status: AC
  Administered 2017-01-13 – 2017-01-18 (×6): 500 mg via ORAL
  Filled 2017-01-12 (×6): qty 1

## 2017-01-12 MED ORDER — HYDROCERIN EX CREA
TOPICAL_CREAM | Freq: Two times a day (BID) | CUTANEOUS | Status: DC
  Administered 2017-01-12 – 2017-01-20 (×15): via TOPICAL
  Filled 2017-01-12 (×2): qty 113

## 2017-01-12 MED ORDER — ACETAMINOPHEN 160 MG/5ML PO SOLN
650.0000 mg | Freq: Four times a day (QID) | ORAL | Status: DC | PRN
  Administered 2017-01-18 – 2017-01-19 (×2): 650 mg via ORAL
  Filled 2017-01-12 (×4): qty 20.3

## 2017-01-12 MED ORDER — BISACODYL 10 MG RE SUPP: 10.0000 mg | Freq: Every day | RECTAL | Status: DC | PRN

## 2017-01-12 MED ORDER — PANTOPRAZOLE SODIUM 40 MG PO PACK
40.0000 mg | PACK | Freq: Every day | ORAL | Status: DC
  Administered 2017-01-12 – 2017-01-19 (×8): 40 mg via ORAL
  Filled 2017-01-12 (×4): qty 20

## 2017-01-12 MED ORDER — FENTANYL 12 MCG/HR TD PT72
12.5000 ug | MEDICATED_PATCH | TRANSDERMAL | Status: AC
  Administered 2017-01-14: 12.5 ug via TRANSDERMAL
  Filled 2017-01-12: qty 1

## 2017-01-12 MED ORDER — DIPHENHYDRAMINE HCL 12.5 MG/5ML PO ELIX: 12.5000 mg | ORAL_SOLUTION | Freq: Four times a day (QID) | ORAL | Status: DC | PRN

## 2017-01-12 MED ORDER — ASPIRIN 81 MG PO CHEW
81.0000 mg | CHEWABLE_TABLET | Freq: Every day | ORAL | Status: DC
  Administered 2017-01-13 – 2017-01-20 (×8): 81 mg via ORAL
  Filled 2017-01-12 (×8): qty 1

## 2017-01-12 MED ORDER — RESOURCE THICKENUP CLEAR PO POWD
ORAL | Status: DC | PRN
  Filled 2017-01-12 (×2): qty 125

## 2017-01-12 MED ORDER — METOPROLOL TARTRATE 25 MG/10 ML ORAL SUSPENSION
50.0000 mg | Freq: Two times a day (BID) | ORAL | Status: DC
  Administered 2017-01-12 – 2017-01-20 (×16): 50 mg via ORAL
  Filled 2017-01-12 (×16): qty 20

## 2017-01-12 MED ORDER — LEVOFLOXACIN 500 MG PO TABS: 500.0000 mg | ORAL_TABLET | ORAL | 0 refills | Status: DC

## 2017-01-12 MED ORDER — PROCHLORPERAZINE MALEATE 5 MG PO TABS: 5.0000 mg | ORAL_TABLET | Freq: Four times a day (QID) | ORAL | Status: DC | PRN

## 2017-01-12 MED ORDER — ASPIRIN 81 MG PO CHEW: 81.0000 mg | CHEWABLE_TABLET | Freq: Every day | ORAL | Status: AC

## 2017-01-12 MED ORDER — ACETAMINOPHEN 650 MG RE SUPP: 650.0000 mg | Freq: Three times a day (TID) | RECTAL | 0 refills | Status: DC | PRN

## 2017-01-12 MED ORDER — METOPROLOL TARTRATE 25 MG/10 ML ORAL SUSPENSION: 50.0000 mg | Freq: Two times a day (BID) | ORAL | Status: DC

## 2017-01-12 MED ORDER — PANTOPRAZOLE SODIUM 40 MG PO PACK: 40.0000 mg | PACK | Freq: Every day | ORAL | Status: DC

## 2017-01-12 MED ORDER — GUAIFENESIN-DM 100-10 MG/5ML PO SYRP: 5.0000 mL | ORAL_SOLUTION | Freq: Four times a day (QID) | ORAL | Status: DC | PRN

## 2017-01-12 MED ORDER — PROCHLORPERAZINE 25 MG RE SUPP: 12.5000 mg | Freq: Four times a day (QID) | RECTAL | Status: DC | PRN

## 2017-01-12 MED ORDER — ALUM & MAG HYDROXIDE-SIMETH 200-200-20 MG/5ML PO SUSP: 30.0000 mL | ORAL | Status: DC | PRN

## 2017-01-12 MED ORDER — POLYETHYLENE GLYCOL 3350 17 G PO PACK: 17.0000 g | PACK | Freq: Every day | ORAL | Status: DC | PRN

## 2017-01-12 MED ORDER — FLEET ENEMA 7-19 GM/118ML RE ENEM: 1.0000 | ENEMA | Freq: Once | RECTAL | Status: DC | PRN

## 2017-01-12 MED ORDER — TRAZODONE HCL 50 MG PO TABS
25.0000 mg | ORAL_TABLET | Freq: Every evening | ORAL | Status: DC | PRN
  Administered 2017-01-18 (×2): 50 mg via ORAL
  Filled 2017-01-12 (×3): qty 1

## 2017-01-12 MED ORDER — ACETAMINOPHEN 325 MG PO TABS
325.0000 mg | ORAL_TABLET | ORAL | Status: DC | PRN
  Administered 2017-01-13 – 2017-01-17 (×5): 650 mg via ORAL
  Administered 2017-01-18: 325 mg via ORAL
  Administered 2017-01-19 – 2017-01-20 (×2): 650 mg via ORAL
  Administered 2017-01-20: 325 mg via ORAL
  Filled 2017-01-12 (×12): qty 2

## 2017-01-12 MED ORDER — PNEUMOCOCCAL VAC POLYVALENT 25 MCG/0.5ML IJ INJ
0.5000 mL | INJECTION | INTRAMUSCULAR | Status: AC
  Administered 2017-01-13: 0.5 mL via INTRAMUSCULAR
  Filled 2017-01-12: qty 0.5

## 2017-01-12 MED ORDER — ENOXAPARIN SODIUM 40 MG/0.4ML ~~LOC~~ SOLN
40.0000 mg | SUBCUTANEOUS | Status: DC
  Administered 2017-01-12 – 2017-01-18 (×7): 40 mg via SUBCUTANEOUS
  Filled 2017-01-12 (×7): qty 0.4

## 2017-01-12 MED ORDER — PROCHLORPERAZINE EDISYLATE 5 MG/ML IJ SOLN: 5.0000 mg | Freq: Four times a day (QID) | INTRAMUSCULAR | Status: DC | PRN

## 2017-01-12 MED ORDER — INFLUENZA VAC SPLIT QUAD 0.5 ML IM SUSY
0.5000 mL | PREFILLED_SYRINGE | INTRAMUSCULAR | Status: AC
  Administered 2017-01-13: 0.5 mL via INTRAMUSCULAR
  Filled 2017-01-12: qty 0.5

## 2017-01-12 NOTE — Progress Notes (Signed)
Pt admitted to unit with girlfriend at bedside 1540. RN provided pt and GF education on rehab and our safety protocol with verbal understanding from GF. Pt alert and oriented to self only. Call bell within reach and bed alarm in place. GF at bedside.

## 2017-01-12 NOTE — Care Management Note (Signed)
Case Management Note  Patient Details  Name: Troy Brennan MRN: [id number] Date of Birth: 12/03/1970  Subjective/Objective:   Admitted with  Acute hypoxic resp failure, hx of ETOH and polysubstance abuse, recently discharged from detox facility 8-10 days prior to admit. Found in cardiac arrest, septic, normothermia, ARDS.                   Action/Plan: Plan is to d/c to CIR.  Expected Discharge Date:  01/12/17               Expected Discharge Plan:  IP Rehab Facility  In-House Referral:  Clinical Social Work  Discharge planning Services  CM Consult  Post Acute Care Choice:  NA Choice offered to:  NA  DME Arranged:  N/A DME Agency:  NA  HH Arranged:  NA HH Agency:  NA  Status of Service:  Completed, signed off  If discussed at [employer], dates discussed:    Additional Comments:  Epifanio Lesches, RN 01/12/2017, 2:03 PM

## 2017-01-12 NOTE — Progress Notes (Signed)
Trached capped per MD's request patient tolerated well, SATS 98% on roon air patient is in no raspatory distress.
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
## 2017-01-12 NOTE — Progress Notes (Signed)
Rehab admissions - I do have a bed available for patient today.  Awaiting medical clearance from attending MD and then will admit to acute inpatient rehab today.  Call me for questions.  #[phone]

## 2017-01-12 NOTE — Progress Notes (Signed)
Name: Troy Brennan MRN: [id number] DOB: May 23, 1971    ADMISSION DATE:  12/21/2016 CONSULTATION DATE:  12/21/2016  REFERRING MD :  Dr. Blinda Leatherwood   CHIEF COMPLAINT:  Cardiac Arrest   BRIEF PATIENT DESCRIPTION:  46 year old male with ETOH and polysubstance abuse, recently discharged from detox facility 8-10 days prior to admit. Found 2/14  in cardiac arrest, septic, normothermia, ARDS. Course complicated by prolonged ventilation due to MRSA PNA and influenza B requiring tracheostomy   SIGNIFICANT EVENTS  2/14  Presents to ED after being found pulseless/apneic at home 2/15  Prone 2/17  Supine, normothermia ongoing, PEEP / FiO2 better 2/19  Normoothermia protocol complete. Did not tolerated precedex d/t increased RR and tachycardia 2/20  Not following commands. Get MRI brain, getting EEG. Starting seroquel I low dose clonazepam.  2/21  Failed PSV. Family decided on trach. DNR, started Vanc for SA PNA 2/22  Platelets for trach, trach at 1400 2/24  Transitioned to SDU 2/27 7 hours on t collar 2/28 24 hours on t collar 3/1 Downsized to 6 cuffless   STUDIES:  CXR 2/14 >>  Diffuse bilateral airspace infiltrates, ? Edema, PNA, or Hemorrhagic Consolidation   CT Head 2/14 >> neg ECHO 2/15 >> 15-20 diffuse low EF Hepatitis panel >> negative MRI brain 2/20 >> Bilateral acute to early subacute ischemia within the basal ganglia. This pattern is most consistent with hypoxic ischemic injury, particularly in the context of recent cardiac arrest. Suspected petechial hemorrhage within both basal ganglia. No space-occupying hematoma. EEG 2/20 >> generalized slowing. Non-specific findings.   LINES/TUBES: Trach (df) 2/22 >>  SUBJECTIVE:  No complaints. Trach currently on PMSV.   VITAL SIGNS: Temp:  [98.5 F (36.9 C)-99.6 F (37.6 C)] 98.5 F (36.9 C) (03/08 0518) Pulse Rate:  [71-80] 77 (03/08 0518) Resp:  [16-19] 18 (03/08 0518) BP: (100-116)/(70-81) 116/77 (03/08 0518) SpO2:  [95  %-97 %] 97 % (03/08 0518) Weight:  [69.2 kg (152 lb 8.9 oz)] 69.2 kg (152 lb 8.9 oz) (03/08 0500)  PHYSICAL EXAMINATION: General:  Adult male, no distress  Neuro:  Alert, oriented, follows commands, no distress  HEENT:  Trach in place  Cardiovascular:  RRR, no MRG, NI S1/S2 Lungs:  No wheezes/crackles, strong cough, non-labored  Abdomen:  Non-tender, non-distended, active bowel sounds  Musculoskeletal:  No acute  Skin:  Warm, dry, intact    Recent Labs Lab 01/09/17 0531 01/11/17 0658 01/11/17 1200  NA 132* 133* 133*  K 3.6 3.5 3.7  CL 102 101 101  CO2 23 22 24   BUN 10 7 7   CREATININE 0.74 0.76 0.76  GLUCOSE 91 107* 93    Recent Labs Lab 01/07/17 0248  HGB 10.9*  HCT 33.9*  WBC 7.5  PLT 225   Dg Swallowing Func-speech Pathology  Result Date: 01/10/2017 Objective Swallowing Evaluation: Type of Study: MBS-Modified Barium Swallow Study Patient Details Name: Troy Brennan MRN: [id number] Date of Birth: 10/02/1971 Today's Date: 01/10/2017 Time: SLP Start Time (ACUTE ONLY): 1011-SLP Stop Time (ACUTE ONLY): 1033 SLP Time Calculation (min) (ACUTE ONLY): 22 min Past Medical History: No past medical history on file. Past Surgical History: No past surgical history on file. HPI: 46 year old male with ETOH and polysubstance abuse, recently discharged from detox facility 8-10 days prior to admit. Found in cardiac arrest, septic, normothermia, ARDS. Found to have anoxic encephalopathy and bilateral basal ganglia stroke. Intubated from 2/14 to 2/22 when trach was placed. Pt currently consuming honey thick liquids. MBS today  for possibility of decreased liquid viscosity. Subjective: Alert, cooperative, follows commands Assessment / Plan / Recommendation CHL IP CLINICAL IMPRESSIONS 01/10/2017 Clinical Impression Passy -Muir speaking valve donned when pt arrived in xray suite. Mr. Spiering presents with a mild oral and moderate sensorimotor pharyngeal dysphagia. Very mild oral phase impairments  included premature spillage with nectar thick liquids. Mild delayed swallow initiation to the valleculae and pyriform sinuses with nectar thick due to decreased sensation. Pt penetrated and aspirated during the swallow (and during 2nd swallows) with thin liquids. Initially, the penetrates/aspirates were not sensed; however, as time progressed, the pt was able to cough reflexively and clear out a majority of the aspirates. Coughing continued throughout the study. Mild vallecular and pyriform sinus residue was observed with nectar thick liquids, although he was able to clear most residue with second swallows. Pt required verbal cueing during PO intake to slow rate and take small sips. Due to aspiration risk and impulsivity, recommend Dys 3 solids, upgrade to nectar thick liquids (no straws), meds whole in puree. Wear PMSV during mealtime (and all waking hours), slow rate, small sips/bites. ST will continue to f/u for diet tolerance and education. SLP Visit Diagnosis Dysphagia, oral phase (R13.11);Dysphagia, pharyngeal phase (R13.13) Attention and concentration deficit following -- Frontal lobe and executive function deficit following -- Impact on safety and function Moderate aspiration risk   CHL IP TREATMENT RECOMMENDATION 01/10/2017 Treatment Recommendations Therapy as outlined in treatment plan below   Prognosis 01/10/2017 Prognosis for Safe Diet Advancement Good Barriers to Reach Goals -- Barriers/Prognosis Comment -- CHL IP DIET RECOMMENDATION 01/10/2017 SLP Diet Recommendations Dysphagia 3 (Mech soft) solids;Nectar thick liquid Liquid Administration via Cup;No straw Medication Administration Whole meds with puree Compensations Slow rate;Small sips/bites Postural Changes Seated upright at 90 degrees   CHL IP OTHER RECOMMENDATIONS 01/10/2017 Recommended Consults -- Oral Care Recommendations Oral care BID Other Recommendations --   CHL IP FOLLOW UP RECOMMENDATIONS 01/10/2017 Follow up Recommendations Skilled Nursing facility    [facility] IP FREQUENCY AND DURATION 01/10/2017 Speech Therapy Frequency (ACUTE ONLY) min 2x/week Treatment Duration 2 weeks      CHL IP ORAL PHASE 01/10/2017 Oral Phase Impaired Oral - Pudding Teaspoon -- Oral - Pudding Cup -- Oral - Honey Teaspoon -- Oral - Honey Cup -- Oral - Nectar Teaspoon -- Oral - Nectar Cup Premature spillage Oral - Nectar Straw Premature spillage Oral - Thin Teaspoon -- Oral - Thin Cup WFL Oral - Thin Straw -- Oral - Puree -- Oral - Mech Soft -- Oral - Regular WFL Oral - Multi-Consistency -- Oral - Pill -- Oral Phase - Comment --  CHL IP PHARYNGEAL PHASE 01/10/2017 Pharyngeal Phase Impaired Pharyngeal- Pudding Teaspoon -- Pharyngeal -- Pharyngeal- Pudding Cup -- Pharyngeal -- Pharyngeal- Honey Teaspoon -- Pharyngeal -- Pharyngeal- Honey Cup -- Pharyngeal -- Pharyngeal- Nectar Teaspoon -- Pharyngeal -- Pharyngeal- Nectar Cup Delayed swallow initiation-pyriform sinuses;Delayed swallow initiation-vallecula;Pharyngeal residue - pyriform;Reduced tongue base retraction Pharyngeal -- Pharyngeal- Nectar Straw Pharyngeal residue - valleculae;Penetration/Apiration after swallow Pharyngeal Material enters airway, remains ABOVE vocal cords and not ejected out Pharyngeal- Thin Teaspoon -- Pharyngeal -- Pharyngeal- Thin Cup Penetration/Aspiration during swallow;Reduced airway/laryngeal closure Pharyngeal Material enters airway, passes BELOW cords without attempt by patient to eject out (silent aspiration);Material enters airway, passes BELOW cords then ejected out Pharyngeal- Thin Straw -- Pharyngeal -- Pharyngeal- Puree -- Pharyngeal -- Pharyngeal- Mechanical Soft -- Pharyngeal -- Pharyngeal- Regular WFL Pharyngeal -- Pharyngeal- Multi-consistency -- Pharyngeal -- Pharyngeal- Pill -- Pharyngeal -- Pharyngeal Comment --  CHL IP  CERVICAL ESOPHAGEAL PHASE 01/10/2017 Cervical Esophageal Phase WFL Pudding Teaspoon -- Pudding Cup -- Honey Teaspoon -- Honey Cup -- Nectar Teaspoon -- Nectar Cup -- Nectar Straw -- Thin  Teaspoon -- Thin Cup -- Thin Straw -- Puree -- Mechanical Soft -- Regular -- Multi-consistency -- Pill -- Cervical Esophageal Comment -- No flowsheet data found. Royce Macadamia 01/10/2017, 12:20 PM Breck Coons Lonell [employer] Personnel officer [phone]               ASSESSMENT / PLAN:  Acute Hypoxic Respiratory Failure secondary to ARDS, MRSA PNA, and Influenza B s/p tracheostomy  -Downsized trach on 3/1 to 6 cuff-less  Plan -Trach care per protocol  -Maintain Saturation >92 -Pulmonary Hygiene  -Continue working with Speech Therapy  -PMSV as tolerated  -Begin capping trails - if tolerates can hopefully remove in next 24 hours   MRSA PNA Plan -Completed 10 day course of Vancomycin  -Trend Fever and WBC curve    Jovita Kussmaul, AG-ACNP [facility]  Pgr: [phone]  PCCM Pgr: [phone]

## 2017-01-13 ENCOUNTER — Inpatient Hospital Stay (HOSPITAL_COMMUNITY): Payer: Self-pay

## 2017-01-13 ENCOUNTER — Inpatient Hospital Stay (HOSPITAL_COMMUNITY): Payer: Self-pay | Admitting: Physical Therapy

## 2017-01-13 ENCOUNTER — Inpatient Hospital Stay (HOSPITAL_COMMUNITY): Payer: Self-pay | Admitting: Speech Pathology

## 2017-01-13 DIAGNOSIS — G931 Anoxic brain damage, not elsewhere classified: Secondary | ICD-10-CM

## 2017-01-13 LAB — COMPREHENSIVE METABOLIC PANEL
ALBUMIN: 2.1 g/dL — AB (ref 3.5–5.0)
ALT: 34 U/L (ref 17–63)
AST: 31 U/L (ref 15–41)
Alkaline Phosphatase: 50 U/L (ref 38–126)
Anion gap: 8 (ref 5–15)
BUN: 5 mg/dL — AB (ref 6–20)
CHLORIDE: 101 mmol/L (ref 101–111)
CO2: 24 mmol/L (ref 22–32)
CREATININE: 0.82 mg/dL (ref 0.61–1.24)
Calcium: 8.3 mg/dL — ABNORMAL LOW (ref 8.9–10.3)
GFR calc non Af Amer: >60 mL/min (ref >60)
GLUCOSE: 111 mg/dL — AB (ref 65–99)
Potassium: 3.4 mmol/L — ABNORMAL LOW (ref 3.5–5.1)
SODIUM: 133 mmol/L — AB (ref 135–145)
Total Bilirubin: 0.6 mg/dL (ref 0.3–1.2)
Total Protein: 6.6 g/dL (ref 6.5–8.1)

## 2017-01-13 LAB — CBC WITH DIFFERENTIAL/PLATELET
BASOS ABS: 0 10*3/uL (ref 0.0–0.1)
Basophils Relative: 0 %
Eosinophils Absolute: 0.3 10*3/uL (ref 0.0–0.7)
Eosinophils Relative: 3 %
HCT: 34.7 % — ABNORMAL LOW (ref 39.0–52.0)
HEMOGLOBIN: 11.1 g/dL — AB (ref 13.0–17.0)
LYMPHS ABS: 1.4 10*3/uL (ref 0.7–4.0)
LYMPHS PCT: 16 %
MCH: 27.1 pg (ref 26.0–34.0)
MCHC: 32 g/dL (ref 30.0–36.0)
MCV: 84.6 fL (ref 78.0–100.0)
Monocytes Absolute: 1.3 10*3/uL — ABNORMAL HIGH (ref 0.1–1.0)
Monocytes Relative: 15 %
NEUTROS PCT: 66 %
Neutro Abs: 5.6 10*3/uL (ref 1.7–7.7)
Platelets: 298 10*3/uL (ref 150–400)
RBC: 4.1 MIL/uL — AB (ref 4.22–5.81)
RDW: 12.3 % (ref 11.5–15.5)
WBC: 8.5 10*3/uL (ref 4.0–10.5)

## 2017-01-13 MED ORDER — POTASSIUM CHLORIDE CRYS ER 20 MEQ PO TBCR
20.0000 meq | EXTENDED_RELEASE_TABLET | Freq: Every day | ORAL | Status: AC
  Administered 2017-01-13 – 2017-01-15 (×3): 20 meq via ORAL
  Filled 2017-01-13 (×3): qty 1

## 2017-01-13 NOTE — Progress Notes (Signed)
Ankit Karis Juba, MD Physician Addendum Physical Medicine and Rehabilitation  Consult Note Date of Service: 01/02/2017 3:03 PM  Related encounter: ED to Hosp-Admission (Discharged) from 12/21/2016 in [facility] 5W MEDICAL     Expand All Collapse All   [] Hide copied text [] Hover for attribution information      Physical Medicine and Rehabilitation Consult Reason for Consult: Cardiac arrest/sepsis/hypoxia Referring Physician: [facility]   HPI: Troy Brennan is a 46 y.o. right handed male with history of alcohol polysubstance abuse recently discharged from a detox facility 8-10 days ago. History taken from chart review. Presented 12/21/2016 with flulike symptoms body aches cough and low-grade fever reported by his girlfriend. He was found down at neck and pulseless by his girlfriend. Underwent multiple rounds of CPR by first responders. Upon arrival to the ED was nonresponsive intubated for airway protection. Cranial CT scan negative. Chest x-ray suspect noncardiogenic pulmonary edema. Urine drug screen positive opiates, benzodiazepines and marijuana. Alcohol negative. Troponin 0.22. Echocardiogram with ejection fraction 20% severe diffuse hypokinesis. EEG negative for seizure. Neurology follow-up for altered mental status. MRI of the brain showed bilateral acute to early subacute ischemia within the basal ganglia. Pattern consistent with hypoxic ischemic injury. Hepatitis panel negative. Patient was later extubated and required tracheostomy presently with a #8 cuffed trach 12/28/2016. Reports of abdominal pain with abdominal distention. X-ray showed possible distal small bowel obstruction. A nasogastric tube was placed. Ongoing bouts of restlessness and agitation. Physical therapy evaluation completed 01/02/2017 with recommendations of physical medicine rehabilitation consult.   Review of Systems  Unable to perform ROS: Patient nonverbal   History reviewed. No  pertinent past medical history, unable to obtain from patient. No past surgical history on file, unable to obtain from patient. No family history on file, unable to obtain from patient. Social History:  has no tobacco, alcohol, and drug history on file., unable to obtain from patient Allergies: No Known Allergies No prescriptions prior to admission.    Home: Home Living Family/patient expects to be discharged to:: Unsure  Functional History: Prior Function Level of Independence: Independent Functional Status:  Mobility: Bed Mobility Overal bed mobility: Needs Assistance Bed Mobility: Rolling, Supine to Sit, Sit to Supine Rolling: Total assist, +2 for physical assistance Supine to sit: Max assist, +2 for physical assistance Sit to supine: Total assist, +2 for physical assistance General bed mobility comments: patient initiating LE elevation back to bed Transfers Overall transfer level: Needs assistance Equipment used: 2 person hand held assist (face to face with gait belt and chuck pad) Transfers: Sit to/from Stand Sit to Stand: Max assist, +2 physical assistance General transfer comment: minimal clearance from bed, patient attempting to initiate push with BUEs  ADL:  Cognition: Cognition Overall Cognitive Status: Difficult to assess Orientation Level: Oriented to person, Oriented to place, Oriented to time, Disoriented to situation Cognition Arousal/Alertness: Awake/alert Behavior During Therapy: Restless Overall Cognitive Status: Difficult to assess General Comments: patient able to follow simple one step commands consistently Difficult to assess due to: Impaired communication, Tracheostomy  Blood pressure 131/88, pulse 92, temperature (!) 100.7 F (38.2 C), temperature source Rectal, resp. rate (!) 29, height 6\' 1"  (1.854 m), weight 77.2 kg (170 lb 4.8 oz), SpO2 96 %. Physical Exam  Vitals reviewed. Constitutional: He appears well-developed.  Frail, b/l  restraints  HENT:  Head: Normocephalic.  Scattered abrasions  Eyes: EOM are normal. Right eye exhibits no discharge. Left eye exhibits no discharge.  Pupil sluggish to light  Neck:  Tracheostomy  in place  Cardiovascular: Normal rate and regular rhythm.   Respiratory: Effort normal.  Decreased breath sounds at the bases  GI: Soft. Bowel sounds are normal. He exhibits no distension.  +NG  Genitourinary:  Genitourinary Comments: +Rectal tube  Musculoskeletal: He exhibits no edema or tenderness.  Neurological: He is alert.  Patient is restless remains nonverbal.  He followed very few one-step simple commands due to his restlessness.  Exam remained limited due to patient's decrease in participation, however, moving all extremities DTRs 3+ throughout  Skin: Skin is warm and dry.  Scattered abrasions  Psychiatric:  Unable to assess due to pt nonverbal    Lab Results Last 24 Hours       Results for orders placed or performed during the hospital encounter of 12/21/16 (from the past 24 hour(s))  Glucose, capillary     Status: Abnormal   Collection Time: 01/01/17  4:59 PM  Result Value Ref Range   Glucose-Capillary 118 (H) 65 - 99 mg/dL  Glucose, capillary     Status: Abnormal   Collection Time: 01/01/17  7:44 PM  Result Value Ref Range   Glucose-Capillary 149 (H) 65 - 99 mg/dL  Glucose, capillary     Status: Abnormal   Collection Time: 01/02/17 12:08 AM  Result Value Ref Range   Glucose-Capillary 111 (H) 65 - 99 mg/dL  Glucose, capillary     Status: Abnormal   Collection Time: 01/02/17  3:15 AM  Result Value Ref Range   Glucose-Capillary 116 (H) 65 - 99 mg/dL  Basic metabolic panel     Status: Abnormal   Collection Time: 01/02/17  3:50 AM  Result Value Ref Range   Sodium 135 135 - 145 mmol/L   Potassium 4.0 3.5 - 5.1 mmol/L   Chloride 97 (L) 101 - 111 mmol/L   CO2 27 22 - 32 mmol/L   Glucose, Bld 118 (H) 65 - 99 mg/dL   BUN 41 (H) 6 - 20 mg/dL    Creatinine, Ser 1.61 0.61 - 1.24 mg/dL   Calcium 8.8 (L) 8.9 - 10.3 mg/dL   GFR calc non Af Amer >60 >60 mL/min   GFR calc Af Amer >60 >60 mL/min   Anion gap 11 5 - 15  Glucose, capillary     Status: Abnormal   Collection Time: 01/02/17  8:45 AM  Result Value Ref Range   Glucose-Capillary 116 (H) 65 - 99 mg/dL  Hemoglobin and hematocrit, blood     Status: None   Collection Time: 01/02/17  9:02 AM  Result Value Ref Range   Hemoglobin 13.8 13.0 - 17.0 g/dL   HCT 09.6 04.5 - 40.9 %  Glucose, capillary     Status: Abnormal   Collection Time: 01/02/17 12:42 PM  Result Value Ref Range   Glucose-Capillary 125 (H) 65 - 99 mg/dL      Imaging Results (Last 48 hours)  Dg Abd 1 View  Result Date: 01/01/2017 CLINICAL DATA:  Nasal/orogastric tube placement. EXAM: ABDOMEN - 1 VIEW COMPARISON:  01/01/2017 at 12:33 p.m. FINDINGS: The tip of the nasal/orogastric tube extends into the proximal stomach. Next item there is persistent small bowel dilation consistent with a partial obstruction, unchanged from the earlier exam. IMPRESSION: Nasal/orogastric tube tip projects in the proximal stomach. Electronically Signed   By: Amie Portland M.D.   On: 01/01/2017 18:22   Dg Chest Port 1 View  Result Date: 01/01/2017 CLINICAL DATA:  Acute respiratory failure with hypoxia. Tracheostomy. EXAM: PORTABLE CHEST 1 VIEW COMPARISON:  One-view chest x-ray 12/31/2016 FINDINGS: Tracheostomy tube is stable position. A small bore feeding tube terminates in the stomach. Interstitial and airspace disease remains. Increasing consolidation is present in the right middle or lower lobe. IMPRESSION: 1. Increase consolidation in the right middle or lower lobe is concerning for atelectasis or less likely infection. 2. Otherwise stable interstitial prominence bilaterally, likely reflecting edema. Electronically Signed   By: Marin Roberts M.D.   On: 01/01/2017 09:54   Dg Chest Port 1 View  Result Date:  01/01/2017 CLINICAL DATA:  Aspiration of food. Cardiac arrest and acute respiratory failure. EXAM: PORTABLE CHEST 1 VIEW COMPARISON:  01/01/2017 FINDINGS: Tracheostomy tube remains in place.  Feeding tube has been removed. Right lower lung atelectasis or infiltrate shows no significant change. Heart size is normal. IMPRESSION: Right lower lung atelectasis versus infiltrate, without significant change. Electronically Signed   By: Myles Rosenthal M.D.   On: 01/01/2017 09:52   Dg Abd Portable 1v  Result Date: 01/02/2017 CLINICAL DATA:  Small bowel obstruction EXAM: PORTABLE ABDOMEN - 1 VIEW COMPARISON:  01/01/2017 FINDINGS: Persistent gaseous distension of small bowel loops in mid abdomen consistent with significant ileus or partial small bowel obstruction. Probable bladder catheter. Some colonic gas noted in transverse colon and descending colon. IMPRESSION: Persistent gaseous distended small bowel loops mid abdomen consistent with significant ileus or partial small bowel obstruction. Electronically Signed   By: Natasha Mead M.D.   On: 01/02/2017 07:52   Dg Abd Portable 1v  Result Date: 01/01/2017 CLINICAL DATA:  Projectile vomiting beginning this morning. EXAM: PORTABLE ABDOMEN - 1 VIEW COMPARISON:  12/26/2016 FINDINGS: Feeding tube is been removed since previous study. Moderately dilated small bowel loops are new since previous study. No evidence of colonic dilatation. Temperature probe noted in urinary bladder. IMPRESSION: Moderate small bowel dilatation, suspicious for distal small bowel obstruction. Electronically Signed   By: Myles Rosenthal M.D.   On: 01/01/2017 13:22     Assessment/Plan: Diagnosis: Anoxic BI Labs and images independently reviewed.  Records reviewed and summated above.             NeuroPsych evaluation for behavorial assessment.             Provide environmental management by reducing the level of stimulation, tolerating restlessness when possible, protecting patient from harming  self or others and reducing patient's cognitive confusion.             Address behavioral concerns include providing structured environments and daily routines.             Cognitive therapy to direct modular abilities in order to maintain goals        including problem solving, self regulation/monitoring, self management, attention, and memory.             Fall precautions; pt at risk for second impact syndrome             Consider Propranolol for agitation and storming             Avoid medications that could impair cognitive abilities, such as anticholinergics, antihistaminic, benzodiazapines, narcotics, etc when possible  1. Does the need for close, 24 hr/day medical supervision in concert with the patient's rehab needs make it unreasonable for this patient to be served in a less intensive setting? Yes 2. Co-Morbidities requiring supervision/potential complications: small bowel obstruction (cont to monitor), Systolic CHF (Monitor in accordance with increased physical activity and avoid UE resistance excercises), opiates, benzodiazepines, etoh and marijuana abuse (counsel when  appropriate), Cardiac arrest/sepsis/hypoxia, pyrexia and leukocytosis/SIRS (cont to monitor for signs and symptoms of infection, further workup if indicated), tachypnea (monitor RR and O2 Sats with increased physical exertion), hyperglycemia (Monitor in accordance with exercise and adjust meds as necessary), agitation (see above, wean IV fentanyl when appropriate), respiratory failure (wean from vent when tolerated) 3. Due to bladder management, bowel management, safety, skin/wound care, disease management, medication administration, pain management and patient education, does the patient require 24 hr/day rehab nursing? Yes 4. Does the patient require coordinated care of a physician, rehab nurse, PT (1-2 hrs/day, 5 days/week), OT (1-2 hrs/day, 5 days/week) and SLP (1-2 hrs/day, 5 days/week) to address physical and functional  deficits in the context of the above medical diagnosis(es)? Yes Addressing deficits in the following areas: balance, endurance, locomotion, strength, transferring, bowel/bladder control, bathing, dressing, feeding, grooming, toileting, cognition, speech, language, swallowing and psychosocial support 5. Can the patient actively participate in an intensive therapy program of at least 3 hrs of therapy per day at least 5 days per week? Potentially 6. The potential for patient to make measurable gains while on inpatient rehab is excellent 7. Anticipated functional outcomes upon discharge from inpatient rehab are min assist and mod assist  with PT, min assist and mod assist with OT, min assist and mod assist with SLP. 8. Estimated rehab length of stay to reach the above functional goals is: 28-32 days. 9. Does the patient have adequate social supports and living environment to accommodate these discharge functional goals? Potentially 10. Anticipated D/C setting: LTAC 11. Anticipated post D/C treatments: LTAC 12. Overall Rehab/Functional Prognosis: fair  RECOMMENDATIONS: This patient's condition is appropriate for continued rehabilitative care in the following setting: Recommend LTAC based on current status.  Will have to inquire about caregiver support if CIR given the fact that patient will require 24/7 assistance post- discharge. Patient has agreed to participate in recommended program. Potentially Note that insurance prior authorization may be required for reimbursement for recommended care.  Comment: Rehab Admissions Coordinator to follow up.  Maryla Morrow, MD, [place] Dom Charlton Amor., PA-C 01/02/2017
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
## 2017-01-13 NOTE — Discharge Summary (Addendum)
Physician Discharge Summary  Troy Brennan ZOX:[id number] DOB: 1971-04-03 DOA: 12/21/2016  PCP: No PCP Per Patient  Admit date: 12/21/2016 Discharge date: 01/12/2017  Time spent: 35 minutes  Recommendations for Outpatient Follow-up:  1. PCP in 1 week 2. Outpatient cardiology in 3-4weeks, needs repeat ECHO 3. Pulm to FU at [facility] for Tracheostomy capping/decannulating  Discharge Diagnoses:  Active Problems:   Cardiac arrest [facility])   Acute encephalopathy   Acute respiratory failure (HCC)   Pneumonia   Ventilator dependent (HCC)   Encounter for central line placement   Anoxic brain injury (HCC)   Anoxic encephalopathy (HCC)   Aspiration of food   SBO (small bowel obstruction)   Acute systolic heart failure (HCC)   Substance abuse   SIRS (systemic inflammatory response syndrome) (HCC)   Fever   Leukocytosis   Tachypnea   Hyperglycemia   Agitation   Discharge Condition: improved  Diet recommendation: Dysphagia 3 with thick liquids  Filed Weights   01/10/17 0252 01/11/17 0618 01/12/17 0500  Weight: 73 kg (160 lb 15 oz) 69.1 kg (152 lb 6.4 oz) 69.2 kg (152 lb 8.9 oz)    History of present illness:  46 year old male with ETOH and polysubstance abuse who was discharged from a detox facility 8-10 days prior to admit, who was found in cardiac arrest on 12/21/16. ROSCwas achieved about 15 minutes later. He was admitted to the ICU and treated for influenza B w/ superimposed bacterial pneumonia along with ARDS.  Hospital Course:  Brief Narrative:  46 year old male with ETOH and polysubstance abuse who was discharged from a detox facility 8-10 days prior to admit, who was found in cardiac arrest on 12/21/16. ROSCwas achieved about 15 minutes later. He was admitted to the ICU and treated for influenza B w/ superimposed bacterial pneumonia along with ARDS. He required prone ventilation and chemical paralytics. Janina Mayo was placed by PCCM on 12/29/2016. He was transferred to the  stepdown unit on 12/31/2016. On transfer to the medical floor, the patient was noted to be persistently febrile. Repeat CXR was notable for R sided pna. Patient was continued on IV abx to cover VAP. Pt has remained afebrile since and improving  Detailed course  Acute Hypoxic Resp Failure - Influenza B + PNA + ARDS -now with chronic resp failure, s/p prolonged VDRF -remained off the ventilator since 2/27 and trach was downsized to a #6 cuff less 01/05/2017.  -improved, awake and interactive and is tolerating ATC with PMV well -? Pneumonia and fevers back on Abx again from 3/5, now improving, afebrile, blood Cx negative -stopped Vancomycin, pulm toilet, changed cefepime to PO levaquin yesterday and continue this for days -Pulm to FU for Trach management and plan for capping trach followed by decannulation etc  MRSA PNA  -Pt had completed 10 days of Vanc in ICU -Pt was febrile since transfer to floor 3/3-3/5 -Repeat CXR with mild R basilar opacity concerning for possible pneumonia. CXR personally reviewed, appears to have somewhat improved aeration compared to prior cxr on 2/28 but with ongoing productive cough, Abx restarted for VAP/HCAP -Now afebrile, Blood Cx negative, stopped Vanc and changed cefepime to Levaquin as above  Anoxic Encephalopathy  Reportedly still displaying some cognitive deficiencies when working w/ therapy  - currently stable and appropriately conversant -improving  Acute Systolic CHF s/p Cardiac arrest  EF 15-20% - no evidence of signif volume overload at this time - follow Is/Os -Remains stable at this time -needs cards FU with ECHO  in 4-6weeks   Acute urinary retention  Foley d/c'd on transfer to floor -no issues now  Dry gangrene R hand  Currently remains dry on exam - pt is aware of risk of loosing parts of his fingers in due course of time  Profuse diarrhea Initially required Flexiseal, since discontinued -  resolved  Thrombocytopenia secondary to severe acute illness - Labs reviewed. Normalized  Hypernatremia Resolved   Debility Given this patients profound life threatening event, and the resultant anoxic brain injury as well as severe systolic CHF, Dr. Sharon Seller did not expect this patient will be physically capable of work for at least 12 months - PT/OT had been consulted with recs for CIR  - CIR officially consulted. Stable for Discharge with ongoing Trach management at CIR by Pulm  Procedures:  Trach placement 2/22    Discharge Exam: Vitals:   01/12/17 0518 01/12/17 1440  BP: 116/77 119/77  Pulse: 77 63  Resp: 18 17  Temp: 98.5 F (36.9 C) 98.4 F (36.9 C)    General: AAOx2 Cardiovascular: S1S2/RRR Respiratory: CTAB  Discharge Instructions   Discharge Instructions    Discharge instructions    Complete by:  As directed    Dysphagia 3 diet with nectar thick liquids   Increase activity slowly    Complete by:  As directed    Increase activity slowly    Complete by:  As directed      Discharge Medication List as of 01/12/2017  3:35 PM    START taking these medications   Details  acetaminophen (TYLENOL) 650 MG suppository Place 1 suppository (650 mg total) rectally every 8 (eight) hours as needed for fever or mild pain., Starting Thu 01/12/2017, No Print    aspirin 81 MG chewable tablet Chew 1 tablet (81 mg total) by mouth daily., Starting Fri 01/13/2017, No Print    levofloxacin (LEVAQUIN) 500 MG tablet Take 1 tablet (500 mg total) by mouth daily. For 3days, Starting Thu 01/12/2017, Print    metoprolol tartrate (LOPRESSOR) 25 mg/10 mL SUSP Take 20 mLs (50 mg total) by mouth 2 (two) times daily., Starting Thu 01/12/2017, No Print    pantoprazole sodium (PROTONIX) 40 mg/20 mL PACK Take 20 mLs (40 mg total) by mouth at bedtime., Starting Thu 01/12/2017, No Print       No Known Allergies    The results of significant diagnostics from this hospitalization (including  imaging, microbiology, ancillary and laboratory) are listed below for reference.    Significant Diagnostic Studies: Dg Abd 1 View  Result Date: 01/05/2017 CLINICAL DATA:  Enteric tube placement. EXAM: ABDOMEN - 1 VIEW COMPARISON:  01/03/2017 FINDINGS: Enteric tube has been advanced and its tip now projects over the gastric fundus with side hole beyond the GE junction as well. There is persistent mild dilatation of a few small bowel loops. Gas is present throughout nondilated colon. No intraperitoneal free air is identified, though evaluation is limited on this supine radiograph. No acute osseous abnormality is seen. IMPRESSION: 1. Interval advancement of enteric tube with tip and side hole in the proximal stomach. 2. Persistent mild small bowel dilatation which may reflect residual ileus or partial obstruction. Electronically Signed   By: Sebastian Ache M.D.   On: 01/05/2017 17:01   Dg Abd 1 View  Result Date: 01/01/2017 CLINICAL DATA:  Nasal/orogastric tube placement. EXAM: ABDOMEN - 1 VIEW COMPARISON:  01/01/2017 at 12:33 p.m. FINDINGS: The tip of the nasal/orogastric tube extends into the proximal stomach. Next item there is persistent  small bowel dilation consistent with a partial obstruction, unchanged from the earlier exam. IMPRESSION: Nasal/orogastric tube tip projects in the proximal stomach. Electronically Signed   By: Amie Portland M.D.   On: 01/01/2017 18:22   Ct Head Wo Contrast  Result Date: 12/21/2016 CLINICAL DATA:  Altered mental status, cardiac arrest, resuscitation, substance abuse, alcohol abuse EXAM: CT HEAD WITHOUT CONTRAST TECHNIQUE: Contiguous axial images were obtained from the base of the skull through the vertex without intravenous contrast. COMPARISON:  None available FINDINGS: Brain: No evidence of acute infarction, hemorrhage, hydrocephalus, extra-axial collection or mass lesion/mass effect. Vascular: No hyperdense vessel or unexpected calcification. Skull: Normal. Negative for  fracture or focal lesion. Sinuses/Orbits: No acute finding. Other: None. IMPRESSION: Normal head CT without contrast Electronically Signed   By: Judie Petit.  Shick M.D.   On: 12/21/2016 08:58   Mr Laqueta Jean TI Contrast  Result Date: 12/28/2016 CLINICAL DATA:  Acute encephalopathy. Alcohol and polysubstance abuse. Recently discharged from detox facility. Status post cardiac arrest. EXAM: MRI HEAD WITHOUT AND WITH CONTRAST TECHNIQUE: Multiplanar, multiecho pulse sequences of the brain and surrounding structures were obtained without and with intravenous contrast. CONTRAST:  18mL MULTIHANCE GADOBENATE DIMEGLUMINE 529 MG/ML IV SOLN COMPARISON:  Head CT 12/21/2016 FINDINGS: Brain: There is bilateral diffusion restriction within the basal ganglia. No other sites of abnormal diffusion are identified. There is associated bilateral basal ganglia hyperintense T2 weighted signal. The remainder of the brain parenchymal signal is normal. There is mild susceptibility within both basal ganglia. No mass lesion or midline shift. No hydrocephalus or extra-axial fluid collection. The midline structures are normal. No age advanced or lobar predominant atrophy. There is faint contrast enhancement at the sites of ischemia. Vascular: Major intracranial arterial and venous sinus flow voids are preserved. No evidence of chronic microhemorrhage or amyloid angiopathy. Skull and upper cervical spine: The visualized skull base, calvarium, upper cervical spine and extracranial soft tissues are normal. Sinuses/Orbits: No fluid levels or advanced mucosal thickening. No mastoid effusion. Normal orbits. IMPRESSION: 1. Bilateral acute to early subacute ischemia within the basal ganglia. This pattern is most consistent with hypoxic ischemic injury, particularly in the context of recent cardiac arrest. 2. Suspected petechial hemorrhage within both basal ganglia. No space-occupying hematoma. 3. No midline shift or other mass effect. Electronically Signed   By:  Deatra Robinson M.D.   On: 12/28/2016 00:01   Dg Chest Port 1 View  Result Date: 01/08/2017 CLINICAL DATA:  Fever. EXAM: PORTABLE CHEST 1 VIEW COMPARISON:  Radiograph of January 04, 2017. FINDINGS: The heart size and mediastinal contours are within normal limits. Tracheostomy is in grossly good position. No pneumothorax or pleural effusion is noted. Left lung is clear. Mild right basilar opacity is noted concerning for possible pneumonia. The visualized skeletal structures are unremarkable. IMPRESSION: Mild right basilar opacity concerning for possible pneumonia. Followup PA and lateral chest X-ray is recommended in 3-4 weeks following trial of antibiotic therapy to ensure resolution and exclude underlying malignancy. Electronically Signed   By: Lupita Raider, M.D.   On: 01/08/2017 15:17   Dg Chest Port 1 View  Result Date: 01/04/2017 CLINICAL DATA:  Respiratory failure EXAM: PORTABLE CHEST 1 VIEW COMPARISON:  01/01/2017 FINDINGS: Cardiac shadow is stable. Tracheostomy tube is noted in satisfactory position. Nasogastric catheter is now seen within the stomach although the proximal side port lies in the distal esophagus. Lungs are well aerated bilaterally although some patchy changes are noted in the right lung base stable from the prior exam. No  bony abnormality is noted. IMPRESSION: Stable right basilar infiltrate. Tubes and lines as described. Electronically Signed   By: Alcide Clever M.D.   On: 01/04/2017 09:27   Dg Chest Port 1 View  Result Date: 01/01/2017 CLINICAL DATA:  Acute respiratory failure with hypoxia. Tracheostomy. EXAM: PORTABLE CHEST 1 VIEW COMPARISON:  One-view chest x-ray 12/31/2016 FINDINGS: Tracheostomy tube is stable position. A small bore feeding tube terminates in the stomach. Interstitial and airspace disease remains. Increasing consolidation is present in the right middle or lower lobe. IMPRESSION: 1. Increase consolidation in the right middle or lower lobe is concerning for  atelectasis or less likely infection. 2. Otherwise stable interstitial prominence bilaterally, likely reflecting edema. Electronically Signed   By: Marin Roberts M.D.   On: 01/01/2017 09:54   Dg Chest Port 1 View  Result Date: 01/01/2017 CLINICAL DATA:  Aspiration of food. Cardiac arrest and acute respiratory failure. EXAM: PORTABLE CHEST 1 VIEW COMPARISON:  01/01/2017 FINDINGS: Tracheostomy tube remains in place.  Feeding tube has been removed. Right lower lung atelectasis or infiltrate shows no significant change. Heart size is normal. IMPRESSION: Right lower lung atelectasis versus infiltrate, without significant change. Electronically Signed   By: Myles Rosenthal M.D.   On: 01/01/2017 09:52   Dg Chest Port 1 View  Result Date: 12/31/2016 CLINICAL DATA:  Acute respiratory acidosis. EXAM: PORTABLE CHEST 1 VIEW COMPARISON:  12/30/2016. FINDINGS: Tracheostomy is midline. Feeding tube is followed into the stomach with the tip projecting beyond the inferior margin of the image. Heart size normal. Mild diffuse interstitial prominence and indistinctness, slightly improved. No definite pleural fluid. IMPRESSION: Mild diffuse interstitial prominence and indistinctness, slightly improved. Findings may be due to improving edema, aspiration or pneumonia. Electronically Signed   By: Leanna Battles M.D.   On: 12/31/2016 07:29   Dg Chest Port 1 View  Result Date: 12/30/2016 CLINICAL DATA:  Pneumonia. EXAM: PORTABLE CHEST 1 VIEW COMPARISON:  12/29/2016. FINDINGS: Tracheostomy to and feeding tube in stable position. Heart size normal. Diffuse multifocal pulmonary infiltrates/edema are again noted. No change. No pleural effusion or pneumothorax. IMPRESSION: 1. Lines and tubes in stable position. 2. Diffuse multifocal pulmonary infiltrates/edema are again noted. No change. Electronically Signed   By: Maisie Fus  Register   On: 12/30/2016 06:16   Dg Chest Port 1 View  Result Date: 12/29/2016 CLINICAL DATA:  Stroke  and trach placement. Pt was on a cooling mat that could not be removed for the x-ray EXAM: PORTABLE CHEST - 1 VIEW COMPARISON:  Earlier film of the same day FINDINGS: Tracheostomy device has been placed in the interval, projecting in expected location. Feeding tube extends at least as far stomach, tip not seen. Patchy bibasilar sit airspace opacities right greater than left slightly improved. Perihilar vascular congestion and opacities also slightly improved. Heart size and mediastinal contours are within normal limits. No effusion.  No pneumothorax. Visualized bones unremarkable. IMPRESSION: 1. Tracheostomy placement without apparent complication. 2. Slight improvement in bilateral infiltrates or edema. Electronically Signed   By: Corlis Leak M.D.   On: 12/29/2016 15:11   Dg Chest Port 1 View  Result Date: 12/29/2016 CLINICAL DATA:  Cardiac arrest. EXAM: PORTABLE CHEST 1 VIEW COMPARISON:  One-view chest x-ray 12/28/2016 FINDINGS: Endotracheal tube is in satisfactory position 4 cm above the carina. Small bore feeding tube courses off the inferior border of the film. The heart size normal. The patient is slightly rotated. Increasing interstitial and airspace disease is present bilaterally, right greater left. Detail is somewhat obscured  by a grid pattern on the right, external to the patient. IMPRESSION: 1. Increasing interstitial and airspace disease concerning for edema or infection, right greater than left. 2. Support apparatus is stable. Electronically Signed   By: Marin Roberts M.D.   On: 12/29/2016 07:15   Dg Chest Port 1 View  Result Date: 12/28/2016 CLINICAL DATA:  Hypoxia EXAM: PORTABLE CHEST 1 VIEW COMPARISON:  December 27, 2016 FINDINGS: Endotracheal tube tip is 4.7 cm above the carina. Central catheter tip is in the superior vena cava. Feeding tube tip is below the diaphragm. No pneumothorax. There is subtle patchy opacity in the right mid lung. Lungs elsewhere clear. Heart size and  pulmonary vascularity are normal. No adenopathy. No bone lesions. IMPRESSION: Tube and catheter positions as described without evident pneumothorax. Subtle infiltrate right mid lung, likely early pneumonia. Lungs elsewhere clear. Cardiac silhouette within normal limits. Electronically Signed   By: Bretta Bang III M.D.   On: 12/28/2016 10:43   Dg Chest Port 1 View  Result Date: 12/27/2016 CLINICAL DATA:  46 year old male with shortness of breath and pneumonia. EXAM: PORTABLE CHEST 1 VIEW COMPARISON:  Chest radiograph dated 12/26/2016 FINDINGS: Endotracheal tube approximately 3 cm above the carina in stable positioning. Left IJ central line appears stable with tip likely at the junction of the SVC and left innominate vein. An enteric tube is partially visualized. Diffuse bilateral interstitial and perihilar airspace disease. There has been interval improvement of the airspace infiltrate involving the right lung compared to the prior radiograph. There is no pleural effusion or pneumothorax. Stable cardiac silhouette. No acute osseous pathology. IMPRESSION: Bilateral interstitial prominence and airspace haziness with overall slight interval improvement of the airspace disease in the right mid to lower lung field compared to prior radiograph. Clinical correlation and follow-up recommended. Support device in stable positioning. Electronically Signed   By: Elgie Collard M.D.   On: 12/27/2016 06:43   Dg Chest Port 1 View  Result Date: 12/26/2016 CLINICAL DATA:  46 year old male with respiratory distress syndrome. Subsequent encounter. EXAM: PORTABLE CHEST 1 VIEW COMPARISON:  12/25/2016 FINDINGS: Endotracheal tube tip 2.4 cm above the carina. Nasogastric tube and feeding tube are in place. Tips not imaged on current exam. Left central line has been retracted with the tip distal left brachiocephalic vein level. No pneumothorax. Asymmetric airspace disease has progressed since prior examination with  progression most notable in the right lower lobe. This may represent pulmonary edema with superimposed infectious infiltrate/ atelectasis right lower lobe. Heart size within normal limits. Calcified aorta. IMPRESSION: Retraction of left central line with the tip at the level of the left brachiocephalic vein. Progressive consolidation right lung base may represent atelectasis or infiltrate superimposed upon asymmetric pulmonary edema. Electronically Signed   By: Lacy Duverney M.D.   On: 12/26/2016 07:01   Dg Chest Port 1 View  Result Date: 12/25/2016 CLINICAL DATA:  Acute respiratory failure EXAM: PORTABLE CHEST 1 VIEW COMPARISON:  Yesterday FINDINGS: Endotracheal tube tip between the clavicular heads and carina. Feeding and orogastric tube at least reaches the stomach. Left IJ central line with tip at the SVC. Bilateral perihilar predominant lung opacity, airspace and interstitial. No effusion or pneumothorax noted. Normal heart size. IMPRESSION: 1. Stable positioning of tubes and central line. 2. Unchanged perihilar pneumonia with possible superimposed edema. Electronically Signed   By: Marnee Spring M.D.   On: 12/25/2016 07:10   Dg Chest Port 1 View  Result Date: 12/24/2016 CLINICAL DATA:  Hypoxia EXAM: PORTABLE CHEST 1 VIEW  COMPARISON:  December 23, 2016 FINDINGS: Endotracheal tube tip is 2.5 cm above the carina. Central catheter tip is in superior vena cava. Nasogastric tube tip and side-port in the stomach. There is also an apparent feeding tube with tip below the diaphragm. No pneumothorax. There is alveolar opacity in both perihilar regions, significantly increased on the left and essentially stable on the right. Heart size is normal. There is evidence of pulmonary venous hypertension. IMPRESSION: Tube and catheter positions as described without pneumothorax. Perihilar opacity bilaterally, significantly increased on the left and essentially stable on the right. Question progression of pulmonary  edema versus progression of pneumonia. Both entities may exist concurrently. Stable cardiac silhouette. Note that there is a degree of pulmonary venous hypertension. Electronically Signed   By: Bretta Bang III M.D.   On: 12/24/2016 07:41   Dg Chest Port 1 View  Result Date: 12/23/2016 CLINICAL DATA:  Acute respiratory failure EXAM: PORTABLE CHEST 1 VIEW COMPARISON:  12/22/2016 FINDINGS: Cardiac shadow is stable. Endotracheal tube, nasogastric catheter and left jugular central line are again seen. The left jugular catheter appears to have withdrawn slightly to the proximal SVC. Diffuse bilateral infiltrates are seen relatively stable given some technical variations in the imaging. Feeding catheter is noted extending to the stomach. No acute abnormality is noted. IMPRESSION: Stable appearance of the chest when compare with the prior exam with the exception of slight withdrawal of the left jugular catheter. Electronically Signed   By: Alcide Clever M.D.   On: 12/23/2016 07:04   Dg Chest Port 1 View  Result Date: 12/22/2016 CLINICAL DATA:  Respiratory failure, cardiac arrest. Intubated patient. EXAM: PORTABLE CHEST 1 VIEW COMPARISON:  21 December 2016 portable chest x-ray. FINDINGS: The lungs are well-expanded. Confluent alveolar opacities bilaterally have become slightly less conspicuous and less confluent. There is no significant pleural effusion. There is no pneumothorax. The heart is normal in size. The pulmonary vascularity is less engorged and more distinct. The endotracheal tube tip lies 3.5 cm above the carina. The esophagogastric tube tip and proximal port project in the gastric cardia. The left internal jugular venous catheter tip projects over the junction of middle and distal thirds of the SVC. IMPRESSION: Improved appearance of the pulmonary interstitium with decreased confluence of the alveolar opacities bilaterally. Decreased pulmonary vascular congestion. The support tubes are in reasonable  position. Electronically Signed   By: David  [place] M.D.   On: 12/22/2016 07:17   Dg Chest Port 1 View  Result Date: 12/21/2016 CLINICAL DATA:  Central catheter placed EXAM: PORTABLE CHEST 1 VIEW COMPARISON:  December 21, 2016 study obtained earlier in the day FINDINGS: Central catheter tip is in the left innominate vein. Endotracheal tube tip is 2.3 cm above the carina. Nasogastric tube tip and side port in stomach. No pneumothorax. There is interstitial and alveolar opacity in a perihilar distribution, stable from earlier in the day. Heart size normal. Mild pulmonary venous hypertension. No adenopathy evident. No bone lesions. IMPRESSION: Tube and catheter positions as described without pneumothorax. Perihilar interstitial alveolar opacity. Suspect noncardiogenic pulmonary edema, although hemorrhage or atypical pneumonia could present in this manner. Stable cardiac silhouette. Electronically Signed   By: Bretta Bang III M.D.   On: 12/21/2016 10:36   Dg Chest Port 1 View  Result Date: 12/21/2016 CLINICAL DATA:  Cardiac arrest EXAM: PORTABLE CHEST 1 VIEW COMPARISON:  None. FINDINGS: An endotracheal tube is present with tip measuring about 4.3 cm above the carina. An enteric tube has been placed. The tip  is in a right lower lobe bronchus and needs to be repositioned. Normal heart size. Diffuse patchy airspace disease throughout the lungs in a perihilar distribution with peripheral sparing. This may represent edema, pneumonia, or hemorrhagic consolidation. Aspiration could also have this appearance in the appropriate setting. Prominent stomach gas. IMPRESSION: Endotracheal tube appears in satisfactory position. Enteric tube is in a right lower lobe bronchus and needs to be repositioned. Diffuse bilateral airspace infiltrates in a mostly perihilar distribution. These results were called by telephone at the time of interpretation on 12/21/2016 at 6:17 am to Dr. Jaci Carrel , who verbally  acknowledged these results. Electronically Signed   By: Burman Nieves M.D.   On: 12/21/2016 06:18   Dg Abd Portable 1v  Result Date: 01/03/2017 CLINICAL DATA:  46 year old male. Recent cardiac arrest. Possible small bowel obstruction or ileus since 01/01/2017. Initial encounter. EXAM: PORTABLE ABDOMEN - 1 VIEW COMPARISON:  01/02/2017 and 01/01/2017, and earlier FINDINGS: Portable AP supine view at 0637 hours. An enteric tube tip is at the level of the gastric cardia. This would place the side hole within the distal thoracic esophagus. Bowel gas pattern is improved since 01/01/2017. There is a residual a 4 cm mid abdominal small bowel loop, but other gas containing small bowel loops are normal. Gas throughout nondilated large bowel. Two catheters or electrodes project over the pelvis as before. No acute osseous abnormality identified. No definite pneumoperitoneum on this supine view. IMPRESSION: 1. Enteric tube tip just inside the stomach indicating the side hole is in the distal esophagus. Advance 8 cm to allow for side hole placement within the stomach. 2. Improving bowel gas pattern since 01/01/2017 compatible with resolving small bowel ileus or obstruction. Electronically Signed   By: Odessa Fleming M.D.   On: 01/03/2017 07:35   Dg Abd Portable 1v  Result Date: 01/02/2017 CLINICAL DATA:  Small bowel obstruction EXAM: PORTABLE ABDOMEN - 1 VIEW COMPARISON:  01/01/2017 FINDINGS: Persistent gaseous distension of small bowel loops in mid abdomen consistent with significant ileus or partial small bowel obstruction. Probable bladder catheter. Some colonic gas noted in transverse colon and descending colon. IMPRESSION: Persistent gaseous distended small bowel loops mid abdomen consistent with significant ileus or partial small bowel obstruction. Electronically Signed   By: Natasha Mead M.D.   On: 01/02/2017 07:52   Dg Abd Portable 1v  Result Date: 01/01/2017 CLINICAL DATA:  Projectile vomiting beginning this  morning. EXAM: PORTABLE ABDOMEN - 1 VIEW COMPARISON:  12/26/2016 FINDINGS: Feeding tube is been removed since previous study. Moderately dilated small bowel loops are new since previous study. No evidence of colonic dilatation. Temperature probe noted in urinary bladder. IMPRESSION: Moderate small bowel dilatation, suspicious for distal small bowel obstruction. Electronically Signed   By: Myles Rosenthal M.D.   On: 01/01/2017 13:22   Dg Abd Portable 1v  Result Date: 12/26/2016 CLINICAL DATA:  NG tube placement EXAM: PORTABLE ABDOMEN - 1 VIEW COMPARISON:  12/22/2016 FINDINGS: Lung bases grossly clear. Upper bowel-gas pattern within normal limits. Esophageal tube tip overlies the duodenum jejunal junction. IMPRESSION: Esophageal tube tip overlies the ligament of Treitz. Electronically Signed   By: Jasmine Pang M.D.   On: 12/26/2016 22:45   Dg Abd Portable 1v  Result Date: 12/22/2016 CLINICAL DATA:  Check feeding catheter placement EXAM: PORTABLE ABDOMEN - 1 VIEW COMPARISON:  None. FINDINGS: Feeding catheter is been placed and lies within the fourth portion of the duodenum. No obstructive changes are seen. A nasogastric catheter is coiled within the  stomach. IMPRESSION: Feeding catheter within the distal duodenum. Electronically Signed   By: Alcide Clever M.D.   On: 12/22/2016 12:26   Dg Swallowing Func-speech Pathology  Result Date: 01/10/2017 Objective Swallowing Evaluation: Type of Study: MBS-Modified Barium Swallow Study Patient Details Name: Troy Brennan MRN: [id number] Date of Birth: 05/14/1971 Today's Date: 01/10/2017 Time: SLP Start Time (ACUTE ONLY): 1011-SLP Stop Time (ACUTE ONLY): 1033 SLP Time Calculation (min) (ACUTE ONLY): 22 min Past Medical History: No past medical history on file. Past Surgical History: No past surgical history on file. HPI: 46 year old male with ETOH and polysubstance abuse, recently discharged from detox facility 8-10 days prior to admit. Found in cardiac arrest, septic,  normothermia, ARDS. Found to have anoxic encephalopathy and bilateral basal ganglia stroke. Intubated from 2/14 to 2/22 when trach was placed. Pt currently consuming honey thick liquids. MBS today for possibility of decreased liquid viscosity. Subjective: Alert, cooperative, follows commands Assessment / Plan / Recommendation CHL IP CLINICAL IMPRESSIONS 01/10/2017 Clinical Impression Passy -Muir speaking valve donned when pt arrived in xray suite. Mr. Flannagan presents with a mild oral and moderate sensorimotor pharyngeal dysphagia. Very mild oral phase impairments included premature spillage with nectar thick liquids. Mild delayed swallow initiation to the valleculae and pyriform sinuses with nectar thick due to decreased sensation. Pt penetrated and aspirated during the swallow (and during 2nd swallows) with thin liquids. Initially, the penetrates/aspirates were not sensed; however, as time progressed, the pt was able to cough reflexively and clear out a majority of the aspirates. Coughing continued throughout the study. Mild vallecular and pyriform sinus residue was observed with nectar thick liquids, although he was able to clear most residue with second swallows. Pt required verbal cueing during PO intake to slow rate and take small sips. Due to aspiration risk and impulsivity, recommend Dys 3 solids, upgrade to nectar thick liquids (no straws), meds whole in puree. Wear PMSV during mealtime (and all waking hours), slow rate, small sips/bites. ST will continue to f/u for diet tolerance and education. SLP Visit Diagnosis Dysphagia, oral phase (R13.11);Dysphagia, pharyngeal phase (R13.13) Attention and concentration deficit following -- Frontal lobe and executive function deficit following -- Impact on safety and function Moderate aspiration risk   CHL IP TREATMENT RECOMMENDATION 01/10/2017 Treatment Recommendations Therapy as outlined in treatment plan below   Prognosis 01/10/2017 Prognosis for Safe Diet Advancement  Good Barriers to Reach Goals -- Barriers/Prognosis Comment -- CHL IP DIET RECOMMENDATION 01/10/2017 SLP Diet Recommendations Dysphagia 3 (Mech soft) solids;Nectar thick liquid Liquid Administration via Cup;No straw Medication Administration Whole meds with puree Compensations Slow rate;Small sips/bites Postural Changes Seated upright at 90 degrees   CHL IP OTHER RECOMMENDATIONS 01/10/2017 Recommended Consults -- Oral Care Recommendations Oral care BID Other Recommendations --   CHL IP FOLLOW UP RECOMMENDATIONS 01/10/2017 Follow up Recommendations Skilled Nursing facility   [facility] IP FREQUENCY AND DURATION 01/10/2017 Speech Therapy Frequency (ACUTE ONLY) min 2x/week Treatment Duration 2 weeks      CHL IP ORAL PHASE 01/10/2017 Oral Phase Impaired Oral - Pudding Teaspoon -- Oral - Pudding Cup -- Oral - Honey Teaspoon -- Oral - Honey Cup -- Oral - Nectar Teaspoon -- Oral - Nectar Cup Premature spillage Oral - Nectar Straw Premature spillage Oral - Thin Teaspoon -- Oral - Thin Cup WFL Oral - Thin Straw -- Oral - Puree -- Oral - Mech Soft -- Oral - Regular WFL Oral - Multi-Consistency -- Oral - Pill -- Oral Phase - Comment --  CHL IP PHARYNGEAL  PHASE 01/10/2017 Pharyngeal Phase Impaired Pharyngeal- Pudding Teaspoon -- Pharyngeal -- Pharyngeal- Pudding Cup -- Pharyngeal -- Pharyngeal- Honey Teaspoon -- Pharyngeal -- Pharyngeal- Honey Cup -- Pharyngeal -- Pharyngeal- Nectar Teaspoon -- Pharyngeal -- Pharyngeal- Nectar Cup Delayed swallow initiation-pyriform sinuses;Delayed swallow initiation-vallecula;Pharyngeal residue - pyriform;Reduced tongue base retraction Pharyngeal -- Pharyngeal- Nectar Straw Pharyngeal residue - valleculae;Penetration/Apiration after swallow Pharyngeal Material enters airway, remains ABOVE vocal cords and not ejected out Pharyngeal- Thin Teaspoon -- Pharyngeal -- Pharyngeal- Thin Cup Penetration/Aspiration during swallow;Reduced airway/laryngeal closure Pharyngeal Material enters airway, passes BELOW cords without  attempt by patient to eject out (silent aspiration);Material enters airway, passes BELOW cords then ejected out Pharyngeal- Thin Straw -- Pharyngeal -- Pharyngeal- Puree -- Pharyngeal -- Pharyngeal- Mechanical Soft -- Pharyngeal -- Pharyngeal- Regular WFL Pharyngeal -- Pharyngeal- Multi-consistency -- Pharyngeal -- Pharyngeal- Pill -- Pharyngeal -- Pharyngeal Comment --  CHL IP CERVICAL ESOPHAGEAL PHASE 01/10/2017 Cervical Esophageal Phase WFL Pudding Teaspoon -- Pudding Cup -- Honey Teaspoon -- Honey Cup -- Nectar Teaspoon -- Nectar Cup -- Nectar Straw -- Thin Teaspoon -- Thin Cup -- Thin Straw -- Puree -- Mechanical Soft -- Regular -- Multi-consistency -- Pill -- Cervical Esophageal Comment -- No flowsheet data found. Royce Macadamia 01/10/2017, 12:20 PM Breck Coons Lonell Face.[employer] [phone]               Microbiology: Recent Results (from the past 240 hour(s))  Culture, respiratory (NON-Expectorated)     Status: None   Collection Time: 01/04/17  9:40 AM  Result Value Ref Range Status   Specimen Description TRACHEAL ASPIRATE  Final   Special Requests NONE  Final   Gram Stain   Final    MODERATE WBC PRESENT, PREDOMINANTLY PMN RARE SQUAMOUS EPITHELIAL CELLS PRESENT MODERATE GRAM NEGATIVE RODS FEW GRAM POSITIVE COCCI IN PAIRS    Culture   Final    MODERATE METHICILLIN RESISTANT STAPHYLOCOCCUS AUREUS   Report Status 01/06/2017 FINAL  Final   Organism ID, Bacteria METHICILLIN RESISTANT STAPHYLOCOCCUS AUREUS  Final      Susceptibility   Methicillin resistant staphylococcus aureus - MIC*    CIPROFLOXACIN <=0.5 SENSITIVE Sensitive     ERYTHROMYCIN <=0.25 SENSITIVE Sensitive     GENTAMICIN <=0.5 SENSITIVE Sensitive     OXACILLIN >=4 RESISTANT Resistant     TETRACYCLINE <=1 SENSITIVE Sensitive     VANCOMYCIN 1 SENSITIVE Sensitive     TRIMETH/SULFA <=10 SENSITIVE Sensitive     CLINDAMYCIN <=0.25 SENSITIVE Sensitive     RIFAMPIN <=0.5 SENSITIVE Sensitive     Inducible Clindamycin  NEGATIVE Sensitive     * MODERATE METHICILLIN RESISTANT STAPHYLOCOCCUS AUREUS  Culture, blood (routine x 2)     Status: None (Preliminary result)   Collection Time: 01/09/17 10:56 AM  Result Value Ref Range Status   Specimen Description BLOOD LEFT ANTECUBITAL  Final   Special Requests IN PEDIATRIC BOTTLE 2CC  Final   Culture NO GROWTH 4 DAYS  Final   Report Status PENDING  Incomplete  Culture, blood (routine x 2)     Status: None (Preliminary result)   Collection Time: 01/09/17 10:59 AM  Result Value Ref Range Status   Specimen Description BLOOD RIGHT ANTECUBITAL  Final   Special Requests IN PEDIATRIC BOTTLE [facility]  Final   Culture NO GROWTH 4 DAYS  Final   Report Status PENDING  Incomplete     Labs: Basic Metabolic Panel:  Recent Labs Lab 01/07/17 0248 01/09/17 0531 01/11/17 0658 01/11/17 1200 01/13/17 0828  NA 132* 132* 133*  133* 133*  K 3.6 3.6 3.5 3.7 3.4*  CL 99* 102 101 101 101  CO2 25 23 22 24 24   GLUCOSE 98 91 107* 93 111*  BUN 14 10 7 7  5*  CREATININE 0.75 0.74 0.76 0.76 0.82  CALCIUM 8.2* 8.2* 8.5* 8.5* 8.3*   Liver Function Tests:  Recent Labs Lab 01/13/17 0828  AST 31  ALT 34  ALKPHOS 50  BILITOT 0.6  PROT 6.6  ALBUMIN 2.1*   No results for input(s): LIPASE, AMYLASE in the last 168 hours. No results for input(s): AMMONIA in the last 168 hours. CBC:  Recent Labs Lab 01/07/17 0248 01/12/17 0733 01/13/17 0828  WBC 7.5 8.4 8.5  NEUTROABS  --   --  5.6  HGB 10.9* 11.2* 11.1*  HCT 33.9* 34.4* 34.7*  MCV 85.8 84.7 84.6  PLT 225 287 298   Cardiac Enzymes: No results for input(s): CKTOTAL, CKMB, CKMBINDEX, TROPONINI in the last 168 hours. BNP: BNP (last 3 results)  Recent Labs  12/21/16 0557  BNP 57.9    ProBNP (last 3 results) No results for input(s): PROBNP in the last 8760 hours.  CBG:  Recent Labs Lab 01/07/17 0429 01/07/17 0748 01/07/17 1218 01/07/17 1655 01/07/17 1930  GLUCAP 94 96 92 87 104*        Signed:  Johnathin Vanderschaaf MD.  [facility] 01/13/2017, 5:11 PM
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
## 2017-01-13 NOTE — Progress Notes (Signed)
Patient information reviewed and entered into eRehab system by Shawn Carattini, RN, CRRN, PPS Coordinator.  Information including medical coding and functional independence measure will be reviewed and updated through discharge.    

## 2017-01-13 NOTE — Progress Notes (Addendum)
[facility] PHYSICAL MEDICINE & REHABILITATION     PROGRESS NOTE    Subjective/Complaints: Had a reasonable night. States that his right hand doesn't hurt too much. Has an appetite. Was able to sleep.   ROS: pt denies nausea, vomiting, diarrhea, cough, shortness of breath or chest pain   Objective: Vital Signs: Blood pressure 118/79, pulse 78, temperature 98.9 F (37.2 C), temperature source Oral, resp. rate 17, height 5\' 10"  (1.778 m), weight 68.2 kg (150 lb 4.8 oz), SpO2 99 %. No results found.  Recent Labs  01/12/17 0733 01/13/17 0828  WBC 8.4 8.5  HGB 11.2* 11.1*  HCT 34.4* 34.7*  PLT 287 298    Recent Labs  01/11/17 1200 01/13/17 0828  NA 133* 133*  K 3.7 3.4*  CL 101 101  GLUCOSE 93 111*  BUN 7 5*  CREATININE 0.76 0.82  CALCIUM 8.5* 8.3*   CBG (last 3)  No results for input(s): GLUCAP in the last 72 hours.  Wt Readings from Last 3 Encounters:  01/13/17 68.2 kg (150 lb 4.8 oz)  01/12/17 69.2 kg (152 lb 8.9 oz)    Physical Exam:  Constitutional: He appears well-developed and well-nourished.   HENT:  Head: Normocephalic and atraumatic.  Eyes: Left eye opacified Left eye opaque. Right pupil round and reactive to light.   Neck: Normal range of motion.  CFS #5 in place with some secretions escaping around base with cough.  plugged Cardiovascular: RRR Respiratory: occasional stridor/upper airways sounds.  GI: Soft. Bowel sounds are normal. He exhibits no distension. There is no tenderness.  Musculoskeletal: He exhibits no edema or tenderness except for Right hand/fingers Neurological: He is alert.  Flat affect. Oriented to self only. Place/situation--knew he was at [facility].   recalled month only with cues. "i'm here because of a problem with my head".   Speech clear. Able to follow basic motor commands without difficulty.  Decreased safety awareness . Strength grossly 4/5 prox to distal in all 4's. Sensation decreased distal right foot and hand.  Skin:  Skin is warm and dry. No rash noted. No erythema. Necrosis of multiple finger tips of right hand and 5th digit right foot.  Psychiatric: affect blunt, cooperative. Absent sensation to touch below the ankle of the left foot.      Assessment/Plan: 1. Functional, cognitive, and mobility deficits secondary to anoxic encephalpathy which require 3+ hours per day of interdisciplinary therapy in a comprehensive inpatient rehab setting. Physiatrist is providing close team supervision and 24 hour management of active medical problems listed below. Physiatrist and rehab team continue to assess barriers to discharge/monitor patient progress toward functional and medical goals.  Function:  Bathing Bathing position      Bathing parts      Bathing assist        Upper Body Dressing/Undressing Upper body dressing                    Upper body assist        Lower Body Dressing/Undressing Lower body dressing                                  Lower body assist        Toileting Toileting          Toileting assist     Transfers Chair/bed transfer             Locomotion Ambulation  Wheelchair          Cognition Comprehension Comprehension assist level: Understands basic 50 - 74% of the time/ requires cueing 25 - 49% of the time  Expression Expression assist level: Expresses basic 50 - 74% of the time/requires cueing 25 - 49% of the time. Needs to repeat parts of sentences.  Social Interaction Social Interaction assist level: Interacts appropriately 50 - 74% of the time - May be physically or verbally inappropriate.  Problem Solving Problem solving assist level: Solves basic 25 - 49% of the time - needs direction more than half the time to initiate, plan or complete simple activities  Memory Memory assist level: Recognizes or recalls less than 25% of the time/requires cueing greater than 75% of the time   Medical Problem List and Plan: 1.   Cognitive deficits, decreased balance secondary to anoxic encephalopathy  -beginning CIR therapies 2VTE Prophylaxis/Anticoagulation: will start Pharmaceutical: Lovenox as platelets stable.   3. Pain Management: Duragesic patch being weaned off--discontinued 3/9 pm 4. Mood: LCSW to follow for evaluation and support as mentation improves.  5. Neuropsych: This patient is not capable of making decisions on his own behalf. 6. Skin/Wound Care: Pressure relief measures. Maintain adequate  Nutritional and hydration status.   -necrotic fingers/toe---local care and observation----no acute intervention otherwise required 7. Fluids/Electrolytes/Nutrition: encourage PO  -replete K+  -I personally reviewed the patient's labs today.  8. HCAP/Aspiration PNA?: Continue Levaquin--antibiotic D# 5---continue for 10 days and stop 9. Hypoxic respiratory failure: Capping trial---no issues at [facility]  -if no problems over weekend can decannulate next week. 10. Dysphagia: Continue dysphagia 3, nectar liquids. Encourage compliance with modified diet.  11. Thrombocytopenia:Has resolved.  12. Dry gangrene right hand: Continue to monitor. Keep dry. 13. Hyponatremia: Slowly improving as diarrhea has resolved.   -sodium holding at 133 today  14. Polysubstance abuse: Educate on importance of cessation.    LOS (Days) 1 A FACE TO FACE EVALUATION WAS PERFORMED  Ranelle Oyster, MD 01/13/2017 9:44 AM
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
## 2017-01-13 NOTE — IPOC Note (Addendum)
Overall Plan of Care [facility]) Patient Details Name: Troy Brennan MRN: [id number] DOB: [birth year]  Admitting Diagnosis: Anoxic BI Hospital Problems: Active Problems:   Anoxic brain injury (HCC)     Functional Problem List: Nursing Behavior, Endurance, Medication Management, Pain, Perception, Safety, Skin Integrity, Sensory  PT Balance, Endurance, Pain, Safety  OT Cognition, Endurance, Sensory, Skin Integrity, Safety, Vision, Balance, Pain  SLP Cognition  TR         Basic ADL's: OT Bathing, Toileting     Advanced  ADL's: OT Simple Meal Preparation     Transfers: PT Car, Bed to Chair  OT Toilet, Tub/Shower     Locomotion: PT Ambulation, Stairs     Additional Impairments: OT    SLP Swallowing, Social Cognition   Problem Solving, Memory, Attention, Awareness, Social Interaction  TR      Anticipated Outcomes Item Anticipated Outcome  Self Feeding MOD I  Swallowing  Mod I    Basic self-care  MOD I  Toileting  MOD I   Bathroom Transfers supervision-MOD I  Bowel/Bladder  Mod I  Transfers  mod I  Locomotion  Mod I in a controlled environment   Communication     Cognition  MinA-Supervision   Pain  3 or less  Safety/Judgment  supervision   Therapy Plan: PT Intensity: Minimum of 1-2 x/day ,45 to 90 minutes PT Frequency: 5 out of 7 days PT Duration Estimated Length of Stay: 7-10 days  OT Intensity: Minimum of 1-2 x/day, 45 to 90 minutes OT Frequency: 5 out of 7 days OT Duration/Estimated Length of Stay: 7-10 days SLP Intensity: Minumum of 1-2 x/day, 30 to 90 minutes SLP Frequency: 3 to 5 out of 7 days SLP Duration/Estimated Length of Stay: 7-10 days        Team Interventions: Nursing Interventions Patient/Family Education, Pain Management, Dysphagia/Aspiration Precaution Training, Disease Management/Prevention, Cognitive Remediation/Compensation, Skin Care/Wound Management, Medication Management, Discharge Planning, Psychosocial Support  PT  interventions Ambulation/gait training, Balance/vestibular training, Cognitive remediation/compensation, Discharge planning, Community reintegration, Functional mobility training, Neuromuscular re-education, Pain management, Patient/family education, Therapeutic Exercise, Therapeutic Activities, Stair training, UE/LE Strength taining/ROM, UE/LE Coordination activities  OT Interventions Warden/ranger, Cognitive remediation/compensation, Community reintegration, Discharge planning, Disease mangement/prevention, DME/adaptive equipment instruction, Pain management, Psychosocial support, Patient/family education, Self Care/advanced ADL retraining, Skin care/wound managment, Therapeutic Activities, Therapeutic Exercise, UE/LE Strength taining/ROM, UE/LE Coordination activities  SLP Interventions Cognitive remediation/compensation, Cueing hierarchy, Functional tasks, Patient/family education, Therapeutic Activities, Dysphagia/aspiration precaution training, Internal/external aids, Environmental controls  TR Interventions    SW/CM Interventions Discharge Planning, Psychosocial Support, Patient/Family Education    Team Discharge Planning: Destination: PT-Home (home to parents house) ,OT- Home , SLP-Home Projected Follow-up: PT-Other (comment), Outpatient PT (Intermittant supervision throughout the day), OT-  Other (comment), SLP-24 hour supervision/assistance, Outpatient SLP, Home Health SLP Projected Equipment Needs: PT-None recommended by PT, OT-  to be determined, SLP-To be determined Equipment Details: PT- , OT-  Patient/family involved in discharge planning: PT- Patient,  OT-Patient, SLP-Patient  MD ELOS: 7-10 days Medical Rehab Prognosis:  Excellent Assessment: The patient has been admitted for CIR therapies with the diagnosis of anoxic brain injury and debility after multiple medical. The team will be addressing functional mobility, strength, stamina, balance, safety, adaptive techniques  and equipment, self-care, bowel and bladder mgt, patient and caregiver education, NMR, cognitive perceptual rx, behavior, ego support, community reintegration. Goals have been set at mod I for mobility and self-care and supervision to min assist with cognition.    Ranelle Oyster, MD, [place] [facility]  See Team Conference Notes for weekly updates to the plan of care
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
## 2017-01-13 NOTE — Progress Notes (Signed)
   Name: Quaid Gochnauer MRN: [id number] DOB: Mar 14, 1971    ADMISSION DATE:  01/12/2017 CONSULTATION DATE:  12/21/2016  REFERRING MD :  Dr. Blinda Leatherwood   CHIEF COMPLAINT:  Cardiac Arrest   BRIEF PATIENT DESCRIPTION:  46 year old male with ETOH and polysubstance abuse, recently discharged from detox facility 8-10 days prior to admit. Found 2/14  in cardiac arrest, septic, normothermia, ARDS. Course complicated by prolonged ventilation due to MRSA PNA and influenza B requiring tracheostomy   SIGNIFICANT EVENTS  2/14  Presents to ED after being found pulseless/apneic at home 2/15  Prone 2/17  Supine, normothermia ongoing, PEEP / FiO2 better 2/19  Normoothermia protocol complete. Did not tolerated precedex d/t increased RR and tachycardia 2/20  Not following commands. Get MRI brain, getting EEG. Starting seroquel I low dose clonazepam.  2/21  Failed PSV. Family decided on trach. DNR, started Vanc for SA PNA 2/22  Platelets for trach, trach at 1400 2/24  Transitioned to SDU 2/27 7 hours on t collar 2/28 24 hours on t collar 3/1 Downsized to 6 cuffless   STUDIES:  CXR 2/14 >>  Diffuse bilateral airspace infiltrates, ? Edema, PNA, or Hemorrhagic Consolidation   CT Head 2/14 >> neg ECHO 2/15 >> 15-20 diffuse low EF Hepatitis panel >> negative MRI brain 2/20 >> Bilateral acute to early subacute ischemia within the basal ganglia. This pattern is most consistent with hypoxic ischemic injury, particularly in the context of recent cardiac arrest. Suspected petechial hemorrhage within both basal ganglia. No space-occupying hematoma. EEG 2/20 >> generalized slowing. Non-specific findings.   LINES/TUBES: Trach (df) 2/22 > 3/9  SUBJECTIVE:  Tolerated Capping trials. Denies dyspnea   VITAL SIGNS: Temp:  [98.4 F (36.9 C)-98.9 F (37.2 C)] 98.9 F (37.2 C) (03/09 0720) Pulse Rate:  [63-95] 81 (03/09 1042) Resp:  [16-18] 18 (03/09 1042) BP: (112-127)/(77-92) 112/77 (03/09 1021) SpO2:  [96  %-100 %] 96 % (03/09 1042) FiO2 (%):  [21 %] 21 % (03/08 1536) Weight:  [68.2 kg (150 lb 4.8 oz)-70.1 kg (154 lb 8.7 oz)] 68.2 kg (150 lb 4.8 oz) (03/09 0720)  PHYSICAL EXAMINATION: General:  Adult male, no distress  Neuro:  Alert, oriented, follows commands, grossly intact  HEENT:  Trach in place > currently capped  Cardiovascular:  RRR, no MRG, NI S1/S2 Lungs:  No wheezes/crackles, strong cough, non-labored Abdomen:  Non-tender, non-distended, active bowel sounds  Musculoskeletal:  No acute  Skin:  Warm, dry, intact    Recent Labs Lab 01/11/17 0658 01/11/17 1200 01/13/17 0828  NA 133* 133* 133*  K 3.5 3.7 3.4*  CL 101 101 101  CO2 22 24 24   BUN 7 7 5*  CREATININE 0.76 0.76 0.82  GLUCOSE 107* 93 111*    Recent Labs Lab 01/07/17 0248 01/12/17 0733 01/13/17 0828  HGB 10.9* 11.2* 11.1*  HCT 33.9* 34.4* 34.7*  WBC 7.5 8.4 8.5  PLT 225 287 298   No results found.  ASSESSMENT / PLAN:  Acute Hypoxic Respiratory Failure secondary to ARDS, MRSA PNA, and Influenza B s/p tracheostomy  -Downsized trach on 3/1 to 6 cuff-less, Capped 3/8 for 24 hours with no complications  Plan -Remove Trach  -Maintain Saturation >92 -Pulmonary Hygiene  -Continue working with Speech Therapy   MRSA PNA Plan -Completed 10 day course of Vancomycin  -Trend Fever and WBC curve    Jovita Kussmaul, AG-ACNP [facility]  Pgr: [phone]  PCCM Pgr: [phone]

## 2017-01-14 ENCOUNTER — Inpatient Hospital Stay (HOSPITAL_COMMUNITY): Payer: Self-pay | Admitting: Speech Pathology

## 2017-01-14 ENCOUNTER — Inpatient Hospital Stay (HOSPITAL_COMMUNITY): Payer: Self-pay | Admitting: Physical Therapy

## 2017-01-14 ENCOUNTER — Inpatient Hospital Stay (HOSPITAL_COMMUNITY): Payer: Self-pay | Admitting: Occupational Therapy

## 2017-01-14 ENCOUNTER — Inpatient Hospital Stay (HOSPITAL_COMMUNITY): Payer: Self-pay

## 2017-01-14 DIAGNOSIS — I69919 Unspecified symptoms and signs involving cognitive functions following unspecified cerebrovascular disease: Secondary | ICD-10-CM

## 2017-01-14 LAB — CULTURE, BLOOD (ROUTINE X 2)
Culture: NO GROWTH
Culture: NO GROWTH

## 2017-01-14 MED ORDER — MUSCLE RUB 10-15 % EX CREA
TOPICAL_CREAM | Freq: Two times a day (BID) | CUTANEOUS | Status: DC
  Administered 2017-01-14 – 2017-01-20 (×11): via TOPICAL
  Filled 2017-01-14: qty 85

## 2017-01-14 NOTE — Progress Notes (Signed)
[place] PHYSICAL MEDICINE & REHABILITATION     PROGRESS NOTE    Subjective/Complaints: Pain in calf area, bilateral, pt ambulating with PT SBA, RN notes discolerated heeels  ROS: pt denies nausea, vomiting, diarrhea, cough, shortness of breath or chest pain   Objective: Vital Signs: Blood pressure 123/81, pulse 76, temperature 98.7 F (37.1 C), temperature source Oral, resp. rate 16, height 5\' 10"  (1.778 m), weight 67.2 kg (148 lb 1.6 oz), SpO2 98 %. No results found.  Recent Labs  01/12/17 0733 01/13/17 0828  WBC 8.4 8.5  HGB 11.2* 11.1*  HCT 34.4* 34.7*  PLT 287 298    Recent Labs  01/11/17 1200 01/13/17 0828  NA 133* 133*  K 3.7 3.4*  CL 101 101  GLUCOSE 93 111*  BUN 7 5*  CREATININE 0.76 0.82  CALCIUM 8.5* 8.3*   CBG (last 3)  No results for input(s): GLUCAP in the last 72 hours.  Wt Readings from Last 3 Encounters:  01/14/17 67.2 kg (148 lb 1.6 oz)  01/12/17 69.2 kg (152 lb 8.9 oz)    Physical Exam:  Constitutional: He appears well-developed and well-nourished.   HENT:  Head: Normocephalic and atraumatic.  Eyes: Left eye opacified Left eye opaque. Right pupil round and reactive to light.   Neck: Normal range of motion.  CFS #5 in place with some secretions escaping around base with cough.  plugged Cardiovascular: RRR Respiratory: occasional stridor/upper airways sounds.  GI: Soft. Bowel sounds are normal. He exhibits no distension. There is no tenderness.  Musculoskeletal: He exhibits no edema , mild calf tenderness Neurological: He is alert.  Flat affect. Oriented to self only. Place/situation-   Speech clear. Able to follow basic motor commands without difficulty.  Decreased safety awareness . Strength grossly 4/5 prox to distal in all 4's. Sensation decreased distal right foot and hand.  Skin: Skin is warm and dry. No rash noted. No erythema. Necrosis of multiple finger tips of right hand and 5th digit right foot. Eschar bilateral  heels Psychiatric: affect blunt, cooperative. Absent sensation to touch below the ankle of the left foot.      Assessment/Plan: 1. Functional, cognitive, and mobility deficits secondary to anoxic encephalpathy which require 3+ hours per day of interdisciplinary therapy in a comprehensive inpatient rehab setting. Physiatrist is providing close team supervision and 24 hour management of active medical problems listed below. Physiatrist and rehab team continue to assess barriers to discharge/monitor patient progress toward functional and medical goals.  Function:  Bathing Bathing position Bathing activity did not occur: Refused (per pt report already performed sink bath reaching 10/10 body parts)    Bathing parts      Bathing assist        Upper Body Dressing/Undressing Upper body dressing   What is the patient wearing?: Pull over shirt/dress     Pull over shirt/dress - Perfomed by patient: Thread/unthread right sleeve, Thread/unthread left sleeve, Put head through opening, Pull shirt over trunk          Upper body assist        Lower Body Dressing/Undressing Lower body dressing   What is the patient wearing?: Pants, Socks, Shoes     Pants- Performed by patient: Thread/unthread right pants leg, Thread/unthread left pants leg, Pull pants up/down, Fasten/unfasten pants       Socks - Performed by patient: Don/doff right sock, Don/doff left sock   Shoes - Performed by patient: Don/doff right shoe, Don/doff left shoe (slip on)  Lower body assist Assist for lower body dressing: Touching or steadying assistance (Pt > 75%) (sit to stand)      Toileting Toileting   Toileting steps completed by patient: Adjust clothing prior to toileting, Performs perineal hygiene, Adjust clothing after toileting   Toileting Assistive Devices: Grab bar or rail  Toileting assist Assist level: Supervision or verbal cues   Transfers Chair/bed transfer   Chair/bed transfer  method: Ambulatory Chair/bed transfer assist level: Supervision or verbal cues       Locomotion Ambulation     Max distance: 200 Assist level: Touching or steadying assistance (Pt > 75%)   Wheelchair          Cognition Comprehension Comprehension assist level: Understands basic 90% of the time/cues < 10% of the time  Expression Expression assist level: Expresses basic 90% of the time/requires cueing < 10% of the time.  Social Interaction Social Interaction assist level: Interacts appropriately 50 - 74% of the time - May be physically or verbally inappropriate.  Problem Solving Problem solving assist level: Solves basic 50 - 74% of the time/requires cueing 25 - 49% of the time  Memory Memory assist level: Recognizes or recalls 50 - 74% of the time/requires cueing 25 - 49% of the time   Medical Problem List and Plan: 1.  Cognitive deficits, decreased balance secondary to anoxic encephalopathy  -CIR PT, OT, SLP 2VTE Prophylaxis/Anticoagulation: will start Pharmaceutical: Lovenox as platelets stable.   3. Pain Management: Duragesic patch being weaned off--discontinued 3/9 pm 4. Mood: LCSW to follow for evaluation and support as mentation improves.  5. Neuropsych: This patient is not capable of making decisions on his own behalf. 6. Skin/Wound Care: Pressure relief measures. Maintain adequate  Nutritional and hydration status.   -necrotic fingers/toe---local care and observation----no acute intervention otherwise required, Eschar heels- PRAFO Calf tenderness without swelling, likely from exercise, will order sportscreme K pad 7. Fluids/Electrolytes/Nutrition: encourage PO  -replete K+  -I personally reviewed the patient's labs today.  8. HCAP/Aspiration PNA?: Continue Levaquin--antibiotic D# 5---continue for 10 days and stop 9. Hypoxic respiratory failure: Capping trial---no issues at pleasant  -if no problems over weekend can decannulate next week. 10. Dysphagia: Continue dysphagia  3, nectar liquids. Encourage compliance with modified diet.  11. Thrombocytopenia:Has resolved.  12. Dry gangrene right hand: Continue to monitor. Keep dry, no evidence of infection. 13. Hyponatremia: Slowly improving as diarrhea has resolved.   -sodium holding at 133 today  14. Polysubstance abuse: Educate on importance of cessation.    LOS (Days) 2 A FACE TO FACE EVALUATION WAS PERFORMED  Erick Colace, MD 01/14/2017 8:11 AM

## 2017-01-14 NOTE — Progress Notes (Signed)
RT came to do stoma check. Pt unavailable at this time. Staff at the pts bedside. RT will continue to follow

## 2017-01-14 NOTE — Plan of Care (Signed)
Problem: RH SKIN INTEGRITY Goal: RH STG SKIN FREE OF INFECTION/BREAKDOWN Min assist  Outcome: Not Progressing Eschar (unstageable) to bilateral heels- PRAFO boots ordered  Problem: RH SAFETY Goal: RH STG ADHERE TO SAFETY PRECAUTIONS W/ASSISTANCE/DEVICE STG Adhere to Safety Precautions With  Mod I Assistance/Device.   Outcome: Not Progressing Pt is impulsive, ambulates to bathroom without assist  Problem: RH COGNITION-NURSING Goal: RH STG ANTICIPATES NEEDS/CALLS FOR ASSIST W/ASSIST/CUES STG Anticipates Needs/Calls for Assist With  Mod I Assistance/Cues.   Outcome: Not Progressing impulsive

## 2017-01-14 NOTE — Progress Notes (Signed)
Pt in no distress. Stoma site WNL

## 2017-01-14 NOTE — Progress Notes (Signed)
Occupational Therapy Session Note  Patient Details  Name: Dace Bossio MRN: [id number] Date of Birth: 06/06/1971  Today's Date: 01/14/2017 OT Individual Time: 0800-0930 OT Individual Time Calculation (min): 90 min    Short Term Goals: Week 1:  OT Short Term Goal 1 (Week 1): STGs=LTGs d/t LOS  Skilled Therapeutic Interventions/Progress Updates:    Pt seen supine in bed upon arrival reporting pain in B calves and agreeable to tx. MD enters room for rounds, examines calves, and recommends stretching and ROM work this date to decrease soreness. Focus of session on ADL retraining, improved balance/functional mobility and BLE stretching. Pt ambulates throughout session with supervision to CGA with 2 minor LOB to R with MIN A to recover. Pt stands at sink with supervision and washes UB/LB with VC for throroughness. Pt ambulates around room to gather new clothing to wear and dons pull over shirt, pants, socks and slip on shoes with supervision-CGA for balance while standing. Pt brushes teeth and uses mouth wash in preparation to complete free water protocol per SLP instruction. Pt takes ~10 sips of water with VC to clear throat and take small sips. Pt coughs once in between sips but no other signs or symptoms of aspiration. In gym Pt lays on therapy mat as OT stretches BLE in all ranges of motion. Pt reports R calf pain>L calf pain and it is present during all stretches. Pt taught standing calf stretches to do in room during free time and pt teaches back technique. Pt completes 3 rounds of obstacle course side stepping B over different height boxes, weaving in/out of cones, crossing midline/throwing horse shoes and stacking cups with supervision-CGA throughout. Pt demo difficulty with side stepping up onto box with R leg and requires CGA and VC for weight shifting. While standing on foam pad to decrease stability of surface, pt stands and crosses midline to retrieve clips at knee level and place on rod  above shoulder level with CGA and VC for posture. Pt stands on foam pad with 1 leg  to hold position for 10 seconds with CGA. Pt ambulates back to room with increased stride length and speed. Pt left in room supine in bed with bed alarm on.   Therapy Documentation Precautions:  Precautions Precautions: Fall Precaution Comments: trach Restrictions Weight Bearing Restrictions: No Pain: Pain Assessment Pain Score: 7   See Function Navigator for Current Functional Status.   Therapy/Group: Individual Therapy  Shon Hale 01/14/2017, 12:34 PM

## 2017-01-14 NOTE — Progress Notes (Signed)
Speech Language Pathology Daily Session Note  Patient Details  Name: Troy Brennan MRN: [id number] Date of Birth: 1971-03-07  Today's Date: 01/14/2017 SLP Individual Time: 1230-1300 SLP Individual Time Calculation (min): 30 min  Short Term Goals: Week 1: SLP Short Term Goal 1 (Week 1): STG=LTGs  Skilled Therapeutic Interventions: Skilled treatment session focused on dysphagia and cognition goals. SLP facilitated session by providing skilled observation of pt consuming regular diet snack with nectar thick liquids.  Pt consumed without overt s/s of aspiration. Pt will air escaping from around stoma, SLP provided Max A multimodal cues on applying pressure to site when speaking. Pt unable to effectively follow directions so nursing notified and came in room to reapply dressing. Vocal quality much improved with new dressing. Mother with questions regarding water protocol and encouraged son not to consume thin liquids. Pt able to state 2 areas of deficits with Mod A verbal cues. Pt able to recall previous therapies from today and 1 activity from each session with Min A verbal cues. Pt was left upright in bed, bed alarm on and family members present. Continue per current plan of care.      Function:  Eating Eating   Modified Consistency Diet: Yes Eating Assist Level: Supervision or verbal cues           Cognition Comprehension Comprehension assist level: Understands basic 90% of the time/cues < 10% of the time  Expression   Expression assist level: Expresses basic 90% of the time/requires cueing < 10% of the time.  Social Interaction Social Interaction assist level: Interacts appropriately 50 - 74% of the time - May be physically or verbally inappropriate.  Problem Solving Problem solving assist level: Solves basic 50 - 74% of the time/requires cueing 25 - 49% of the time  Memory Memory assist level: Recognizes or recalls 50 - 74% of the time/requires cueing 25 - 49% of the time     Pain    Therapy/Group: Individual Therapy   Jenicka Coxe B. Dreama Saa, M.S., CCC-SLP Speech-Language Pathologist   Kemani Heidel 01/14/2017, 1:28 PM

## 2017-01-14 NOTE — Progress Notes (Signed)
Getting up without assist. Bedalarm alerts staff. Per report, drinking from sink in room. Bilateral heels dark, fingers to right hand and pinky toe on right foot. Without complaint of pain during night. Troy Brennan A

## 2017-01-14 NOTE — Progress Notes (Signed)
Orthopedic Tech Progress Note Patient Details:  Troy Brennan [facility] 1970/11/18 [id number]  Ortho Devices Ortho Device/Splint Location: Bilateral Prafo boots Ortho Device/Splint Interventions: Application   Troy Brennan 01/14/2017, 11:37 AM

## 2017-01-14 NOTE — Progress Notes (Addendum)
Occupational Therapy Session Note  Patient Details  Name: Troy Brennan MRN: [id number] Date of Birth: 10-14-1971  Today's Date: 01/14/2017 OT Individual Time: 3335-4562 OT Individual Time Calculation (min): 32 min    Short Term Goals: Week 1:  OT Short Term Goal 1 (Week 1): STGs=LTGs d/t LOS  Skilled Therapeutic Interventions/Progress Updates: Pt was lying in bed with family present at time of arrival, agreeable to session. Pt engaged in connect 4 while standing with supervision. Tx focus on R UE FMC/sensation, standing endurance, and cognitive skills. He required min-mod cues for instruction following and maintaining attention to task. Pt would sit on bed x2 during game with cues for him to stand again and continue participation. Pt exhibited difficultly with lifting small pieces off of table with R UE. He used L UE to compensate for deficits. Max cues provided for using R UE to remediate functional skills. At end of session pt returned to bed with supervision. He was left with all needs within reach and bed alarm activated at time of departure.      Therapy Documentation Precautions:  Precautions Precautions: Fall Precaution Comments: trach Restrictions Weight Bearing Restrictions: No General:   Vital Signs: Therapy Vitals Temp: 97.8 F (36.6 C) Temp Source: Oral Pulse Rate: 74 Resp: 16 BP: 120/81 Patient Position (if appropriate): Lying Oxygen Therapy SpO2: 97 % O2 Device: Not Delivered Pain: No c/o pain during session  Pain Assessment Pain Assessment: 0-10 Pain Score: 0-No pain ADL:   See Function Navigator for Current Functional Status.   Therapy/Group: Individual Therapy  Stephine Langbehn A Brenson Hartman 01/14/2017, 5:33 PM

## 2017-01-14 NOTE — Progress Notes (Signed)
Physical Therapy Session Note  Patient Details  Name: Troy Brennan MRN: [id number] Date of Birth: 11/09/1970  Today's Date: 01/14/2017 PT Individual Time: 1030-1115 PT Individual Time Calculation (min): 45 min   Short Term Goals: Week 1:  PT Short Term Goal 1 (Week 1): STG's equal LTGs due to estimated length of stay.   Skilled Therapeutic Interventions/Progress Updates: Pt received supine in bed, denies pain and agreeable to treatment. Gait to gym close S with occasional staggering to R side. Performed Nustep x9 min with BUE/BLE level 4 with average 50 steps/min for cardiovascular endurance. Dynamic standing balance and endurance while engaged in wii bowling with use of RUE for managing wiimote, min cues for problem solving and game play. Dynamic standing balance and gait engaged in basketball throws toward basket with numbered targets on floor and pt shooting from number before progressing to next numbered spot. Gait to return to room with S. Remained supine in bed with alarm intact at end of session, all needs in reach.      Therapy Documentation Precautions:  Precautions Precautions: Fall Precaution Comments: trach Restrictions Weight Bearing Restrictions: No  See Function Navigator for Current Functional Status.   Therapy/Group: Individual Therapy  Vista Lawman 01/14/2017, 11:46 AM

## 2017-01-15 DIAGNOSIS — I69319 Unspecified symptoms and signs involving cognitive functions following cerebral infarction: Secondary | ICD-10-CM

## 2017-01-15 MED ORDER — WHITE PETROLATUM GEL
Status: AC
Start: 2017-01-15 — End: 2017-01-15
  Administered 2017-01-15: 10:00:00
  Filled 2017-01-15: qty 1

## 2017-01-15 NOTE — Plan of Care (Signed)
Problem: RH KNOWLEDGE DEFICIT BRAIN INJURY Goal: RH STG INCREASE KNOWLEDGE OF DYSPHAGIA/FLUID INTAKE Pt will comply with dysphagia guidelines as set by SLP with mod assist  Outcome: Not Progressing Continues to attempt to drink from faucet in room

## 2017-01-15 NOTE — Progress Notes (Signed)
Complained of pain to bilateral calves, scheduled sports cream applied. Bed alarm in place to remind patient and alert staff when getting up without assistance. Decreased safety awareness, impulsive. Bilateral PRAFO boots in place. No change in eschar to fingers on right hand, little toe on right foot and bilateral heels. Troy Brennan A
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
## 2017-01-15 NOTE — Progress Notes (Signed)
[facility] PHYSICAL MEDICINE & REHABILITATION     PROGRESS NOTE    Subjective/Complaints: No issues overnite, received PRAFOs  ROS: pt denies nausea, vomiting, diarrhea, cough, shortness of breath or chest pain   Objective: Vital Signs: Blood pressure 124/80, pulse 79, temperature 99.1 F (37.3 C), temperature source Oral, resp. rate 18, height 5\' 10"  (1.778 m), weight 67 kg (147 lb 11.2 oz), SpO2 97 %. No results found.  Recent Labs  01/12/17 0733 01/13/17 0828  WBC 8.4 8.5  HGB 11.2* 11.1*  HCT 34.4* 34.7*  PLT 287 298    Recent Labs  01/13/17 0828  NA 133*  K 3.4*  CL 101  GLUCOSE 111*  BUN 5*  CREATININE 0.82  CALCIUM 8.3*   CBG (last 3)  No results for input(s): GLUCAP in the last 72 hours.  Wt Readings from Last 3 Encounters:  01/15/17 67 kg (147 lb 11.2 oz)  01/12/17 69.2 kg (152 lb 8.9 oz)    Physical Exam:  Constitutional: He appears well-developed and well-nourished.   HENT:  Head: Normocephalic and atraumatic.  Eyes: Left eye opacified Left eye opaque. Right pupil round and reactive to light.   Neck: Normal range of motion.  CFS #5 in place with some secretions escaping around base with cough.  plugged Cardiovascular: RRR Respiratory: occasional stridor/upper airways sounds.  GI: Soft. Bowel sounds are normal. He exhibits no distension. There is no tenderness.  Musculoskeletal: He exhibits no edema , mild calf tenderness Neurological: He is alert.  Flat affect. Oriented to self only. Place/situation-   Speech clear. Able to follow basic motor commands without difficulty.  Decreased safety awareness . Strength grossly 4/5 prox to distal in all 4's. Sensation decreased distal right foot and hand.  Skin: Skin is warm and dry. No rash noted. No erythema. Necrosis of multiple finger tips of right hand and 5th digit right foot. Eschar bilateral heels Psychiatric: affect blunt, cooperative. Absent sensation to touch below the ankle of the left foot.      Assessment/Plan: 1. Functional, cognitive, and mobility deficits secondary to anoxic encephalpathy which require 3+ hours per day of interdisciplinary therapy in a comprehensive inpatient rehab setting. Physiatrist is providing close team supervision and 24 hour management of active medical problems listed below. Physiatrist and rehab team continue to assess barriers to discharge/monitor patient progress toward functional and medical goals.  Function:  Bathing Bathing position Bathing activity did not occur: Refused (per pt report already performed sink bath reaching 10/10 body parts) Position: Standing at sink  Bathing parts Body parts bathed by patient: Right arm, Left arm, Chest, Abdomen, Front perineal area, Buttocks, Right upper leg, Left upper leg, Right lower leg, Left lower leg Body parts bathed by helper: Back  Bathing assist Assist Level: Supervision or verbal cues      Upper Body Dressing/Undressing Upper body dressing   What is the patient wearing?: Pull over shirt/dress     Pull over shirt/dress - Perfomed by patient: Thread/unthread right sleeve, Thread/unthread left sleeve, Put head through opening, Pull shirt over trunk          Upper body assist        Lower Body Dressing/Undressing Lower body dressing   What is the patient wearing?: Pants, Socks, Shoes     Pants- Performed by patient: Thread/unthread right pants leg, Thread/unthread left pants leg, Pull pants up/down, Fasten/unfasten pants       Socks - Performed by patient: Don/doff right sock, Don/doff left sock  Shoes - Performed by patient: Don/doff right shoe, Don/doff left shoe            Lower body assist Assist for lower body dressing: Touching or steadying assistance (Pt > 75%)      Toileting Toileting   Toileting steps completed by patient: Adjust clothing prior to toileting, Performs perineal hygiene, Adjust clothing after toileting   Toileting Assistive Devices: Grab bar or  rail  Toileting assist Assist level: Supervision or verbal cues   Transfers Chair/bed transfer   Chair/bed transfer method: Ambulatory Chair/bed transfer assist level: Supervision or verbal cues       Locomotion Ambulation     Max distance: 200 Assist level: Supervision or verbal cues   Wheelchair          Cognition Comprehension Comprehension assist level: Understands basic 90% of the time/cues < 10% of the time  Expression Expression assist level: Expresses basic 90% of the time/requires cueing < 10% of the time.  Social Interaction Social Interaction assist level: Interacts appropriately 50 - 74% of the time - May be physically or verbally inappropriate.  Problem Solving Problem solving assist level: Solves basic 50 - 74% of the time/requires cueing 25 - 49% of the time  Memory Memory assist level: Recognizes or recalls 50 - 74% of the time/requires cueing 25 - 49% of the time   Medical Problem List and Plan: 1.  Cognitive deficits, decreased balance secondary to anoxic encephalopathy  -CIR PT, OT, SLP 2VTE Prophylaxis/Anticoagulation: will start Pharmaceutical: Lovenox as platelets stable.   3. Pain Management: Duragesic patch being weaned off--discontinued 3/9 pm 4. Mood: LCSW to follow for evaluation and support as mentation improves.  5. Neuropsych: This patient is not capable of making decisions on his own behalf. 6. Skin/Wound Care: Pressure relief measures. Maintain adequate  Nutritional and hydration status.   -necrotic fingers/toe---local care and observation----no acute intervention otherwise required, Eschar heels- PRAFO Calf tenderness without swelling, likely from exercise, will order sportscreme K pad, no c/os today 7. Fluids/Electrolytes/Nutrition: encourage PO  -replete K+  -I personally reviewed the patient's labs today.  8. HCAP/Aspiration PNA?: Continue Levaquin--antibiotic D# 5---continue for 10 days and stop 9. Hypoxic respiratory failure: Capping  trial---no issues at pleasant  -if no problems over weekend can decannulate next week. 10. Dysphagia: Continue dysphagia 3, nectar liquids. Encourage compliance with modified diet.  11. Thrombocytopenia:Has resolved.  12. Dry gangrene right hand: Continue to monitor. Keep dry, no evidence of infection. 13. Hyponatremia: Slowly improving as diarrhea has resolved.   -sodium holding at 133 today  14. Polysubstance abuse: Educate on importance of cessation.    LOS (Days) 3 A FACE TO FACE EVALUATION WAS PERFORMED  Erick Colace, MD 01/15/2017 7:49 AM

## 2017-01-15 NOTE — Plan of Care (Signed)
Problem: RH SKIN INTEGRITY Goal: RH STG SKIN FREE OF INFECTION/BREAKDOWN Min assist  Outcome: Not Progressing Eschar areas to bilateral heels  Problem: RH SAFETY Goal: RH STG ADHERE TO SAFETY PRECAUTIONS W/ASSISTANCE/DEVICE STG Adhere to Safety Precautions With  Mod I Assistance/Device.   Outcome: Not Progressing Getting up with assistance.

## 2017-01-16 ENCOUNTER — Inpatient Hospital Stay (HOSPITAL_COMMUNITY): Payer: Self-pay | Admitting: Physical Therapy

## 2017-01-16 ENCOUNTER — Inpatient Hospital Stay (HOSPITAL_COMMUNITY): Payer: Self-pay | Admitting: Speech Pathology

## 2017-01-16 ENCOUNTER — Inpatient Hospital Stay (HOSPITAL_COMMUNITY): Payer: Self-pay | Admitting: Occupational Therapy

## 2017-01-16 DIAGNOSIS — I96 Gangrene, not elsewhere classified: Secondary | ICD-10-CM

## 2017-01-16 DIAGNOSIS — Z930 Tracheostomy status: Secondary | ICD-10-CM

## 2017-01-16 DIAGNOSIS — J9601 Acute respiratory failure with hypoxia: Secondary | ICD-10-CM

## 2017-01-16 DIAGNOSIS — E871 Hypo-osmolality and hyponatremia: Secondary | ICD-10-CM

## 2017-01-16 NOTE — Plan of Care (Signed)
Problem: RH KNOWLEDGE DEFICIT BRAIN INJURY Goal: RH STG INCREASE KNOWLEDGE OF DYSPHAGIA/FLUID INTAKE Pt will comply with dysphagia guidelines as set by SLP with mod assist  Outcome: Not Progressing Attempts to drink water from faucet.

## 2017-01-16 NOTE — Progress Notes (Signed)
[facility]IC[facility]ILITATION     PROGRESS NOTE    Subjective/Complaints: No issues overnite, received PRAFOs  ROS: pt denies nausea, vomiting, diarrhea, cough, shortness of breath or chest pain   Objective: Vital Signs: Blood pressure 118/80, pulse 80, temperature 98.2 F (36.8 C), temperature source Oral, resp. rate 18, height 5\' 10"  (1.778 m), weight 67.3 kg (148 lb 6.4 oz), SpO2 98 %. No results found. No results for input(s): WBC, HGB, HCT, PLT in the last 72 hours. No results for input(s): NA, K, CL, GLUCOSE, BUN, CREATININE, CALCIUM in the last 72 hours.  Invalid input(s): CO CBG (last 3)  No results for input(s): GLUCAP in the last 72 hours.  Wt Readings from Last 3 Encounters:  01/15/17 67.3 kg (148 lb 6.4 oz)  01/12/17 69.2 kg (152 lb 8.9 oz)    Physical Exam:  Constitutional: He appears well-developed and well-nourished.   HENT:  Head: Normocephalic and atraumatic.  Eyes: Left eye opacified Left eye opaque. Right pupil round and reactive to light.   Neck: Normal range of motion.  Trach stoma dressed. Air easily moving through dressing.  Cardiovascular: RRR Respiratory: occasional stridor/upper airways sounds.  GI: Soft. Bowel sounds are normal. He exhibits no distension. There is no tenderness.  Musculoskeletal: He exhibits no edema , mild calf tenderness Neurological: He is alert and oriented to self-place    Speech clear. Able to follow basic motor commands without difficulty.  Decreased safety awareness . Strength grossly 4/5 prox to distal in all 4's. Sensation decreased distal right foot and hand.  Skin: Skin is warm and dry. No rash noted. No erythema. Necrosis of multiple finger tips of right hand and 5th digit right foot. Eschar bilateral heels also Psychiatric: affect blunt, cooperative.       Assessment/Plan: 1. Functional, cognitive, and mobility deficits secondary to anoxic encephalpathy which require 3+ hours per day of  interdisciplinary therapy in a comprehensive inpatient rehab setting. Physiatrist is providing close team supervision and 24 hour management of active medical problems listed below. Physiatrist and rehab team continue to assess barriers to discharge/monitor patient progress toward functional and medical goals.  Function:  Bathing Bathing position Bathing activity did not occur: Refused (per pt report already performed sink bath reaching 10/10 body parts) Position: Standing at sink  Bathing parts Body parts bathed by patient: Right arm, Left arm, Chest, Abdomen, Front perineal area, Buttocks, Right upper leg, Left upper leg, Right lower leg, Left lower leg Body parts bathed by helper: Back  Bathing assist Assist Level: Supervision or verbal cues      Upper Body Dressing/Undressing Upper body dressing   What is the patient wearing?: Pull over shirt/dress     Pull over shirt/dress - Perfomed by patient: Thread/unthread right sleeve, Thread/unthread left sleeve, Put head through opening, Pull shirt over trunk          Upper body assist        Lower Body Dressing/Undressing Lower body dressing   What is the patient wearing?: Pants, Socks, Shoes     Pants- Performed by patient: Thread/unthread right pants leg, Thread/unthread left pants leg, Pull pants up/down, Fasten/unfasten pants       Socks - Performed by patient: Don/doff right sock, Don/doff left sock   Shoes - Performed by patient: Don/doff right shoe, Don/doff left shoe            Lower body assist Assist for lower body dressing: Touching or steadying assistance (Pt > 75%)  Toileting Toileting   Toileting steps completed by patient: Adjust clothing prior to toileting, Performs perineal hygiene, Adjust clothing after toileting   Toileting Assistive Devices: Grab bar or rail  Toileting assist Assist level: Supervision or verbal cues   Transfers Chair/bed transfer   Chair/bed transfer method:  Ambulatory Chair/bed transfer assist level: Supervision or verbal cues       Locomotion Ambulation     Max distance: 200 Assist level: Supervision or verbal cues   Wheelchair          Cognition Comprehension Comprehension assist level: Understands basic 90% of the time/cues < 10% of the time  Expression Expression assist level: Expresses basic 90% of the time/requires cueing < 10% of the time.  Social Interaction Social Interaction assist level: Interacts appropriately 50 - 74% of the time - May be physically or verbally inappropriate.  Problem Solving Problem solving assist level: Solves basic 50 - 74% of the time/requires cueing 25 - 49% of the time  Memory Memory assist level: Recognizes or recalls 50 - 74% of the time/requires cueing 25 - 49% of the time   Medical Problem List and Plan: 1.  Cognitive deficits, decreased balance secondary to anoxic encephalopathy  -CIR PT, OT, SLP therapies---pt making functional gains 2VTE Prophylaxis/Anticoagulation: will start Pharmaceutical: Lovenox as platelets stable.   3. Pain Management: Duragesic patch being weaned off--discontinued 3/9 pm 4. Mood: LCSW to follow for evaluation and support as mentation improves.  5. Neuropsych: This patient is not capable of making decisions on his own behalf. 6. Skin/Wound Care: Pressure relief measures. Maintain adequate  Nutritional and hydration status.   -necrotic fingers/toe/foot---local care and observation----no acute intervention otherwise required, Eschar heels- PRAFO while in bed Calf tenderness without swelling, likely from exercise, will order sportscreme K pad, no c/os today 7. Fluids/Electrolytes/Nutrition: encourage PO  -replete K+  - .  8. HCAP/Aspiration PNA?: Continue Levaquin-- continue for 10 days and stop 9. Hypoxic respiratory failure: Capping trial---no issues at pleasant  -decannulated. Needs occlusive dressing  -instructed patient/wife on occlusion of trach with fingers  also 10. Dysphagia: Continue dysphagia 3, nectar liquids. Encourage compliance with modified diet.  11. Thrombocytopenia:Has resolved.  12. Dry gangrene right hand: Continue to monitor. Keep dry, no evidence of infection. 13. Hyponatremia: Slowly improving as diarrhea has resolved.   -sodium holding at 133 --recheck this week  14. Polysubstance abuse: Educate on importance of cessation.    LOS (Days) 4 A FACE TO FACE EVALUATION WAS PERFORMED  Ranelle Oyster, MD 01/16/2017 9:31 AM

## 2017-01-16 NOTE — Progress Notes (Addendum)
Physical Therapy Session Note  Patient Details  Name: Troy Brennan MRN: [id number] Date of Birth: October 07, 1971  Today's Date: 01/16/2017 PT Individual Time: 1107-1202 PT Individual Time Calculation (min): 55 min   Short Term Goals: Week 1:  PT Short Term Goal 1 (Week 1): STG's equal LTGs due to estimated length of stay.   Skilled Therapeutic Interventions/Progress Updates:  Pt received in room & agreeable to tx. Session focused on dynamic balance during gait, weight shifting/postural righting reactions, endurance training, and BLE strengthening. Pt utilized cybex kinetron in standing with BUE support and cuing for upright posture with fair return demo. Pt utilized biodex limits of stability with supervision and max cuing for weight shifting; pt naturally bears most weight through heels (L>R). While standing on wedge, for B heel cord stretch, pt tossed ball into trampoline. Pt utilized nu-step with BLE only on level 2 x 10 minutes with task focusing on endurance training. Throughout session pt required multiple rest breaks 2/2 fatigue and low activity tolerance. At end of session pt left sitting on EOB with RN entering room.   Therapy Documentation Precautions:  Precautions Precautions: Fall Precaution Comments: trach Restrictions Weight Bearing Restrictions: No  Pain: No c/o pain at beginning of session. Pt only noted B calf muscle soreness.  Addendum: At end of session pt c/o B foot pain & RN made aware.   See Function Navigator for Current Functional Status.   Therapy/Group: Individual Therapy  Sandi Mariscal 01/16/2017, 12:18 PM

## 2017-01-16 NOTE — Plan of Care (Signed)
Problem: RH SKIN INTEGRITY Goal: RH STG SKIN FREE OF INFECTION/BREAKDOWN Min assist  Outcome: Not Progressing Refusing to wear boots, eschar to bilateral heels.

## 2017-01-16 NOTE — Progress Notes (Signed)
Speech Language Pathology Daily Session Note  Patient Details  Name: Troy Brennan MRN: [id number] Date of Birth: 1971-06-06  Today's Date: 01/16/2017 SLP Individual Time: 1130-1155 SLP Individual Time Calculation (min): 25 min  Short Term Goals: Week 1: SLP Short Term Goal 1 (Week 1): STG=LTGs  Skilled Therapeutic Interventions: Skilled treatment session focused on dysphagia goals. SLP facilitated session by providing skilled observation with lunch meal of Dys. 3 textures with nectar-thick liquids. Patient demonstrated cough X 1 at end of meal and was Mod I for use of swallowing compensatory strategies. Overall, patient appeared more engaged and interactive this session but would not apply pressure to stoma to keep air from escaping during verbal expression despite Max A multimodal cues. Patient left supine in bed with alarm on and all needs within reach. Continue with current plan of care.      Function:  Eating Eating   Modified Consistency Diet: Yes Eating Assist Level: Supervision or verbal cues           Cognition Comprehension Comprehension assist level: Understands basic 90% of the time/cues < 10% of the time  Expression   Expression assist level: Expresses basic 90% of the time/requires cueing < 10% of the time.  Social Interaction Social Interaction assist level: Interacts appropriately 50 - 74% of the time - May be physically or verbally inappropriate.  Problem Solving Problem solving assist level: Solves basic 50 - 74% of the time/requires cueing 25 - 49% of the time  Memory Memory assist level: Recognizes or recalls 50 - 74% of the time/requires cueing 25 - 49% of the time    Pain No reports of pain   Therapy/Group: Individual Therapy  Katalina Magri 01/16/2017, 12:52 PM

## 2017-01-16 NOTE — Care Management Note (Signed)
[facility] Individual Statement of Services  Patient Name:  Troy Brennan  Date:  01/15/2017  Welcome to the [facility].  Our goal is to provide you with an individualized program based on your diagnosis and situation, designed to meet your specific needs.  With this comprehensive rehabilitation program, you will be expected to participate in at least 3 hours of rehabilitation therapies Monday-Friday, with modified therapy programming on the weekends.  Your rehabilitation program will include the following services:  Physical Therapy (PT), Occupational Therapy (OT), Speech Therapy (ST), 24 hour per day rehabilitation nursing, Therapeutic Recreaction (TR), Neuropsychology, Case Management (Social Worker), Rehabilitation Medicine, Nutrition Services and Pharmacy Services  Weekly team conferences will be held on Tuesdays to discuss your progress.  Your Social Worker will talk with you frequently to get your input and to update you on team discussions.  Team conferences with you and your family in attendance may also be held.  Expected length of stay: 7 days  Overall anticipated outcome: supervision  Depending on your progress and recovery, your program may change. Your Social Worker will coordinate services and will keep you informed of any changes. Your Social Worker's name and contact numbers are listed  below.  The following services may also be recommended but are not provided by the [facility]patient [facility]:   Driving Evaluations  Home Health Rehabiltiation Services  Outpatient Rehabilitation Services  Vocational Rehabilitation   Arrangements will be made to provide these services after discharge if needed.  Arrangements include referral to agencies that provide these services.  Your insurance has been verified to be:  None Your primary doctor is:  None  Pertinent information will be shared with your doctor and your insurance  company.  Social Worker:  [place], [place] [phone] or ([phone]   Information discussed with and copy given to patient by: Amada Jupiter, 01/15/2017, 1:55 PM

## 2017-01-16 NOTE — Progress Notes (Signed)
Physical Therapy Session Note  Patient Details  Name: Troy Brennan MRN: [id number] Date of Birth: 02/26/1971  Today's Date: 01/16/2017 PT Individual Time: 9068-9340 PT Individual Time Calculation (min): 56 min   Short Term Goals: Week 1:  PT Short Term Goal 1 (Week 1): STG's equal LTGs due to estimated length of stay.   Skilled Therapeutic Interventions/Progress Updates: Pt presented asleep but easily aroused. Ambulated to rehab gym no AD x 1 R stagger but self corrected. Performed mod balance and endurance activities using Wii including boxing, and Wii fit balance activities. Pt required moderate rest breaks throughout due to fatigue. Pt with no LOB however required encouragement for increased lateral wt shifting. Also performed high balance activities including side stepping, backwards walking, marching and tandem walking. Pt demonstrated no LOB with these activities, but required seated rest between bouts due to fatigue. Pt ambulated back to room and returned to bed with alarm set and all needs met.      Therapy Documentation Precautions:  Precautions Precautions: Fall Precaution Comments: trach Restrictions Weight Bearing Restrictions: No   See Function Navigator for Current Functional Status.   Therapy/Group: Individual Therapy  Cathey Fredenburg  Anayah Arvanitis, PTA  01/16/2017, 3:48 PM

## 2017-01-16 NOTE — Plan of Care (Signed)
Problem: RH SAFETY Goal: RH STG ADHERE TO SAFETY PRECAUTIONS W/ASSISTANCE/DEVICE STG Adhere to Safety Precautions With  Mod I Assistance/Device.   Outcome: Not Progressing Very impulsive, getting up without assistance.

## 2017-01-16 NOTE — Progress Notes (Signed)
Refused scheduled sports cream, reports calves feel better. Refused Bilateral PRAFO boots, reminded of areas to heels and the importance of wearing boots, continued to refuse. Reminded girlfriend about not allowing patient to drink from faucet in room or drink any thin liquids. Spoke with her about the water, per the water protocol. "I wish someone would've told me that." This RN discussed the water protocol with her on previous day. Decreased safety and impulsive. Alfredo Martinez A

## 2017-01-16 NOTE — Progress Notes (Signed)
Occupational Therapy Session Note  Patient Details  Name: Frank Neer MRN: [id number] Date of Birth: 30-May-1971  Today's Date: 01/16/2017 OT Individual Time:  -  :1005-1105  (60 min)      Short Term Goals: Week 1:  OT Short Term Goal 1 (Week 1): STGs=LTGs d/t LOS :     Skilled Therapeutic Interventions/Progress Updates:    : Pt was lying in bed.    Tx focus on R UE FMC/sensation, standing endurance, balance,and cognitive skills.   PPt  Oriented to place, time and situation.  Ambulated with SBA to gym.  Performed standing balance with horsshoe activity with pt performing guarded movements.  Performed 9hole peg with RUE in 107 sec and 105 seconds.    Performed  Ball toss with 3 inch ball. Gave ppt hand flexion/ extension, intrinsic exercise, diadokinesis for in room exercise.    Pt demonstrated exercises 5 minutes later with good recall.  Ambulated back to room and pt returned to bed.   Therapy Documentation Precautions:  Precautions Precautions: Fall Precaution Comments: trach Restrictions Weight Bearing Restrictions: No General:      Pain: Pain Assessment Pain Assessment: 0-10 Pain Score: 8  Pain Type: Acute pain Pain Location: Leg Pain Orientation: Right;Left Pain Intervention(s): Medication (See eMAR)    Exercises:  See above      See Function Navigator for Current Functional Status.   Therapy/Group: Individual Therapy  Humberto Seals 01/16/2017, 10:18 AM

## 2017-01-17 ENCOUNTER — Inpatient Hospital Stay (HOSPITAL_COMMUNITY): Payer: Self-pay | Admitting: Physical Therapy

## 2017-01-17 ENCOUNTER — Inpatient Hospital Stay (HOSPITAL_COMMUNITY): Payer: Self-pay | Admitting: Speech Pathology

## 2017-01-17 ENCOUNTER — Inpatient Hospital Stay (HOSPITAL_COMMUNITY): Payer: Self-pay | Admitting: Occupational Therapy

## 2017-01-17 NOTE — Progress Notes (Signed)
[facility] PH[facility]ICAL MEDICINE & REHABILITATION     PROGRESS NOTE    Subjective/Complaints: No new problems.   ROS: pt denies nausea, vomiting, diarrhea, cough, shortness of breath or chest pain   Objective: Vital Signs: Blood pressure 120/78, pulse 70, temperature 98.2 F (36.8 C), temperature source Oral, resp. rate 18, height 5\' 10"  (1.778 m), weight 64 kg (141 lb 1.5 oz), SpO2 98 %. No results found. No results for input(s): WBC, HGB, HCT, PLT in the last 72 hours. No results for input(s): NA, K, CL, GLUCOSE, BUN, CREATININE, CALCIUM in the last 72 hours.  Invalid input(s): CO CBG (last 3)  No results for input(s): GLUCAP in the last 72 hours.  Wt Readings from Last 3 Encounters:  01/17/17 64 kg (141 lb 1.5 oz)  01/12/17 69.2 kg (152 lb 8.9 oz)    Physical Exam:  Constitutional: He appears well-developed and well-nourished.   HENT:  Head: Normocephalic and atraumatic.  Eyes: Left eye opacified Left eye opaque. Right pupil round and reactive to light.   Neck: Normal range of motion.  Trach dressed, still leaking air Cardiovascular: RRR Respiratory: occasional stridor/upper airways sounds.  GI: Soft. Bowel sounds are normal. He exhibits no distension. There is no tenderness.  Musculoskeletal: He exhibits no edema , mild calf tenderness Neurological: He is alert and oriented to self-place    Speech clear. Able to follow basic motor commands without difficulty.  Decreased safety awareness . Strength grossly 4/5 prox to distal in all 4's. Sensation decreased distal right foot and hand---no change Skin: Skin is warm and dry. No rash noted. No erythema. Necrosis of multiple finger tips of right hand and 5th digit right foot. Eschar bilateral heels also Psychiatric: affect blunt, cooperative.       Assessment/Plan: 1. Functional, cognitive, and mobility deficits secondary to anoxic encephalpathy which require 3+ hours per day of interdisciplinary therapy in a comprehensive  inpatient rehab setting. Physiatrist is providing close team supervision and 24 hour management of active medical problems listed below. Physiatrist and rehab team continue to assess barriers to discharge/monitor patient progress toward functional and medical goals.  Function:  Bathing Bathing position Bathing activity did not occur: Refused (per pt report already performed sink bath reaching 10/10 body parts) Position: Standing at sink  Bathing parts Body parts bathed by patient: Right arm, Left arm, Chest, Abdomen, Front perineal area, Buttocks, Right upper leg, Left upper leg, Right lower leg, Left lower leg Body parts bathed by helper: Back  Bathing assist Assist Level: Supervision or verbal cues      Upper Body Dressing/Undressing Upper body dressing   What is the patient wearing?: Pull over shirt/dress     Pull over shirt/dress - Perfomed by patient: Thread/unthread right sleeve, Thread/unthread left sleeve, Put head through opening, Pull shirt over trunk          Upper body assist Assist Level: Supervision or verbal cues      Lower Body Dressing/Undressing Lower body dressing   What is the patient wearing?: Pants, Socks, Shoes     Pants- Performed by patient: Thread/unthread right pants leg, Thread/unthread left pants leg, Pull pants up/down, Fasten/unfasten pants       Socks - Performed by patient: Don/doff right sock, Don/doff left sock   Shoes - Performed by patient: Don/doff right shoe, Don/doff left shoe            Lower body assist Assist for lower body dressing: Touching or steadying assistance (Pt > 75%)  Toileting Toileting   Toileting steps completed by patient: Adjust clothing prior to toileting, Performs perineal hygiene, Adjust clothing after toileting   Toileting Assistive Devices: Grab bar or rail  Toileting assist Assist level: Supervision or verbal cues   Transfers Chair/bed transfer   Chair/bed transfer method: Ambulatory Chair/bed  transfer assist level: Supervision or verbal cues       Locomotion Ambulation     Max distance: >150 ft Assist level: Supervision or verbal cues   Wheelchair          Cognition Comprehension Comprehension assist level: Understands basic 90% of the time/cues < 10% of the time  Expression Expression assist level: Expresses basic 90% of the time/requires cueing < 10% of the time.  Social Interaction Social Interaction assist level: Interacts appropriately 50 - 74% of the time - May be physically or verbally inappropriate.  Problem Solving Problem solving assist level: Solves basic 50 - 74% of the time/requires cueing 25 - 49% of the time  Memory Memory assist level: Recognizes or recalls 50 - 74% of the time/requires cueing 25 - 49% of the time   Medical Problem List and Plan: 1.  Cognitive deficits, decreased balance secondary to anoxic encephalopathy  -CIR PT, OT, SLP therapies  -team conference today 2VTE Prophylaxis/Anticoagulation: will start Pharmaceutical: Lovenox as platelets stable.   3. Pain Management: Duragesic patch being weaned off--discontinued 3/9 pm 4. Mood: LCSW to follow for evaluation and support as mentation improves.  5. Neuropsych: This patient is not capable of making decisions on his own behalf. 6. Skin/Wound Care: Pressure relief measures. Maintain adequate  Nutritional and hydration status.   -necrotic fingers/toe/foot---local care and observation----no acute intervention otherwise required, Eschar heels- PRAFO while in bed  -probably will need surgical intervention for some of fingers unfortunately 7. Fluids/Electrolytes/Nutrition: encourage PO  -replete K+  - .  8. HCAP/Aspiration PNA?: Continue Levaquin-- continue for 10 days and stop 9. Hypoxic respiratory failure: t  -decannulated. Continue occlusive dressing  10. Dysphagia: Continue dysphagia 3, nectar liquids. Encourage compliance with modified diet.  11. Thrombocytopenia:Has resolved.  12. Dry  gangrene right hand: Continue to monitor. Keep dry, no evidence of infection. 13. Hyponatremia: Slowly improving as diarrhea has resolved.   -sodium holding at 133 --recheck Thursday 14. Polysubstance abuse: smoking cessation counseling  LOS (Days) 5 A FACE TO FACE EVALUATION WAS PERFORMED  Ranelle Oyster, MD 01/17/2017 10:52 AM

## 2017-01-17 NOTE — Progress Notes (Signed)
Physical Therapy Session Note  Patient Details  Name: Troy Brennan MRN: [id number] Date of Birth: [birth year]  Today's Date: 01/17/2017 PT Individual Time: 1007-1102 PT Individual Time Calculation (min): 55 min   Short Term Goals: Week 1:  PT Short Term Goal 1 (Week 1): STG's equal LTGs due to estimated length of stay.   Skilled Therapeutic Interventions/Progress Updates:  Pt received in room & agreeable to tx. Session focused on ambulation, cognitive remediation, balance, and endurance training. Pt ambulated throughout unit without AD & distant supervision.  Pt assembled pipe tree shapes (simple>complex) with small pieces with min cuing for correct pipe selection. Pt is R handed but prefers to use L hand majority of time during task. Addressed pt's balance deficits by having him stand on rocker board without BUE support with eyes open and closed with anterior/posterior and lateral challenges to balance; pt required close supervision for balance. Pt utilized nu-step on level 4 x 10 min with BLE only with task focusing on BLE strengthening & endurance training. Pt reports 8/10 RPE and requires 1 rest break during activity 2/2 fatigue. At end of session pt left in bed with all needs within reach & bed alarm set.   During session pt reports he feels his memory is slightly impaired compared to prior to admission. Pt requires max encouragement to elaborate on this and then reports he finds it more difficult to recall date/time but was able to recall them correctly during session. Offered to provide pt with calender in room but pt declined, reporting he would check the date on the weather channel on TV.  Therapy Documentation Precautions:  Precautions Precautions: Fall Precaution Comments: trach Restrictions Weight Bearing Restrictions: No  Pain: C/o B leg, feet & hand pain. RN made aware & administered meds.   See Function Navigator for Current Functional Status.   Therapy/Group:  Individual Therapy  Sandi Mariscal 01/17/2017, 12:02 PM

## 2017-01-17 NOTE — Patient Care Conference (Signed)
Inpatient RehabilitationTeam Conference and Plan of Care Update Date: 01/17/2017   Time: 2:50 PM    Patient Name: Troy Brennan [facility]      Medical Record Number: [id number]  Date of Birth: 02/03/1971 Sex: Male         Room/Bed: 4W17C/4W17C-01 Payor Info: Payor: MEDICAID POTENTIAL / Plan: MEDICAID POTENTIAL / Product Type: *No Product type* /    Admitting Diagnosis: 6 Tach  Admit Date/Time:  01/12/2017  3:49 PM Admission Comments: No comment available   Primary Diagnosis:  Anoxic brain injury (HCC) Principal Problem: Anoxic brain injury [facility])  Patient Active Problem List   Diagnosis Date Noted  . Acute respiratory failure with hypoxia (HCC)   . SBO (small bowel obstruction)   . Acute systolic heart failure (HCC)   . Substance abuse   . SIRS (systemic inflammatory response syndrome) (HCC)   . Fever   . Leukocytosis   . Tachypnea   . Hyperglycemia   . Agitation   . Aspiration of food   . Anoxic brain injury (HCC)   . Anoxic encephalopathy (HCC)   . Pressure injury of skin 12/26/2016  . Pneumonia   . Ventilator dependent (HCC)   . Encounter for central line placement   . Cardiac arrest (HCC) 12/21/2016  . Acute encephalopathy   . Acute respiratory failure [facility])     Expected Discharge Date: Expected Discharge Date: 01/20/17  Team Members Present: Physician leading conference: Dr. 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 substance abuse  improve activity tolerance and safety awareness  balance, wound care, pain control, bp/cv mgt   Bowel/Bladder   continent of bowel and bladder LBM 01-16-17  remain continent of bowel and bladder  Assist with toileting needs, monitor of bowel and bladder  pattern   Swallow/Nutrition/ Hydration   Dys. 3 textures with nectar-thick liquids, water protocol  Mod I  repeat MBS to assess for possible upgradee\    ADL's      mod I for basic and familar tasks- supervision for cognition   d/c planning, activity tolerance, cognitive remediation    Mobility   supervision overall for functional mobility  supervision stair negotiation, mod I transfers, independent ambulation in home but supervision in community  endurance training, strengthening, cognitive remediation, balance   Communication             Safety/Cognition/ Behavioral Observations  Min-Mod A   Supervision-Min A  recall and problem solving    Pain   pain managed with tylenol 650mg  q 4   no c/o pain   Assess pain q shift and prn   Skin   fingers on right hand black, left pink toe black, skin dry eucerin cream in use   No new skin issue/breakdown  Assess skin q shift and prn    Rehab Goals Patient on target to meet rehab goals: Yes *See Care Plan and progress notes for long and short-term goals.  Barriers to Discharge: safety awareness, memory    Possible Resolutions to Barriers:  supervision, ongoing cognitve remediation, family ed.     Discharge Planning/Teaching Needs:  Plan to d/c home with family in [place], [place].  Mother able to provide 24/7 supervision.  Will schedule with mother once d/c  date known.   Team Discussion:  May need surgical intervention soon for necrotic digits/ extremeties.  ST addressing swallow - on H2O protocol and plan for MBS on Thursday.  Keeps blowing the stoma open - need better occlusive dressing.  Did poorly on MoCA with ST and plan to meet with neuropsych tomorrow.  Goals for mod ind/ supervision overall.  Revisions to Treatment Plan:  None   Continued Need for Acute Rehabilitation Level of Care: The patient requires daily medical management by a physician with specialized training in physical medicine and rehabilitation for the following  conditions: Daily direction of a multidisciplinary physical rehabilitation program to ensure safe treatment while eliciting the highest outcome that is of practical value to the patient.: Yes Daily medical management of patient stability for increased activity during participation in an intensive rehabilitation regime.: Yes Daily analysis of laboratory values and/or radiology reports with any subsequent need for medication adjustment of medical intervention for : Post surgical problems;Neurological problems  Jaramiah Bossard 01/18/2017, 11:32 AM

## 2017-01-17 NOTE — Progress Notes (Signed)
Occupational Therapy Session Note  Patient Details  Name: Troy Brennan MRN: [id number] Date of Birth: 1970/12/25  Today's Date: 01/17/2017 OT Individual Time: 1500-1630 OT Individual Time Calculation (min): 90 min    Short Term Goals: Week 1:  OT Short Term Goal 1 (Week 1): STGs=LTGs d/t LOS  Skilled Therapeutic Interventions/Progress Updates:    Pt seen for OT session focusing on functional activity tolerance, attention to task, and standing balance.  Pt in supine upon arrival, agreeable to tx session. He ambulated throughout unit with supervision. Completed dynamic standing activities including ball toss and adding cognitive aspect with pt required to list names of food in alphabetical order, requiring mod cuing for recalling food items. Seated rest breaks required throughout standing trials. He ambulated off unit to therapy gift store. Pt able to follow written directs to successfully navigate to gift store. Once pt in gift store, he followed shopping list to find needed items, able to complete with increased time, supervision for community ambulation. Following seated rest break, pt returned to gift store to find prices for needed items and completed simple math with increased time for budgeting of shopping trip. Pt returned to therapy gym, able to successfully navigate back to unit without cuing.  Seated EOM, pt provided with theraputty for R UE fine motor coordination and grasp strengthening. Pt beginning to bleed following activity, RN made aware and applid bandage. Pt returned to room at end of session, left in supine with all needs in reach and bed alarm on.   Therapy Documentation Precautions:  Precautions Precautions: Fall Precaution Comments: trach Restrictions Weight Bearing Restrictions: No Pain:   No/ denies pain  See Function Navigator for Current Functional Status.   Therapy/Group: Individual Therapy  Lewis, Sura Canul C 01/17/2017, 7:11 AM

## 2017-01-17 NOTE — Progress Notes (Signed)
Speech Language Pathology Daily Session Note  Patient Details  Name: Troy Brennan MRN: [id number] Date of Birth: 1971/07/02  Today's Date: 01/17/2017 SLP Individual Time: 0825-0910 SLP Individual Time Calculation (min): 45 min  Short Term Goals: Week 1: SLP Short Term Goal 1 (Week 1): STG=LTGs  Skilled Therapeutic Interventions: Skilled treatment session focused on cognitive and dysphagia goals. SLP facilitated session by providing supervision verbal cues for problem solving with a basic money management task and for utilization of a BID pill box during medication management task. Patient performed self-care tasks with Mod I and consumed trials of thin liquids via cup with delayed cough X 1. Recommend MBS tomorrow to assess for possible upgrade. Patient left upright in bed with all needs within reach. Continue with current plan of care.      Function:   Cognition Comprehension Comprehension assist level: Understands basic 90% of the time/cues < 10% of the time  Expression   Expression assist level: Expresses basic 90% of the time/requires cueing < 10% of the time.  Social Interaction Social Interaction assist level: Interacts appropriately 50 - 74% of the time - May be physically or verbally inappropriate.  Problem Solving Problem solving assist level: Solves basic 50 - 74% of the time/requires cueing 25 - 49% of the time  Memory Memory assist level: Recognizes or recalls 50 - 74% of the time/requires cueing 25 - 49% of the time    Pain No/Denies Pain   Therapy/Group: Individual Therapy  Allura Doepke 01/17/2017, 3:19 PM

## 2017-01-18 ENCOUNTER — Inpatient Hospital Stay (HOSPITAL_COMMUNITY): Payer: Self-pay

## 2017-01-18 ENCOUNTER — Inpatient Hospital Stay (HOSPITAL_COMMUNITY): Payer: Self-pay | Admitting: Speech Pathology

## 2017-01-18 ENCOUNTER — Inpatient Hospital Stay (HOSPITAL_COMMUNITY): Payer: Self-pay | Admitting: Physical Therapy

## 2017-01-18 ENCOUNTER — Inpatient Hospital Stay (HOSPITAL_COMMUNITY): Payer: Self-pay | Admitting: *Deleted

## 2017-01-18 ENCOUNTER — Encounter (HOSPITAL_COMMUNITY): Payer: Self-pay | Admitting: Psychology

## 2017-01-18 NOTE — Progress Notes (Signed)
Physical Therapy Session Note  Patient Details  Name: Troy Brennan MRN: [id number] Date of Birth: 04-16-1971  Today's Date: 01/18/2017 PT Individual Time: 1000-1057 PT Individual Time Calculation (min): 57 min   Short Term Goals: Week 1:  PT Short Term Goal 1 (Week 1): STG's equal LTGs due to estimated length of stay.   Skilled Therapeutic Interventions/Progress Updates:  Pt received in bed & agreeable to tx. Session focused on endurance training, high level gait training, & balance. Pt ambulated throughout unit without AD & mod I during session. Pt utilized cybex kinetron in standing with BUE support up to 40 cm/sec, 30 seconds at a time x 4 bouts with seated rest breaks in between. Pt engaged in zoom ball 1 min 30 seconds + 3 minutes. For high level balance & gait training pt engaged in backwards & lateral walking, figure 8's, and braiding with supervision/steady assist. Pt completed Berg Balance Test & scored 47/56 (please see chart for Sharlene Motts details); educated pt on interpretation of score & current fall risk. Patient demonstrates increased fall risk as noted by score of 47/56 on Berg Balance Scale.  (<36= high risk for falls, close to 100%; 37-45 significant >80%; 46-51 moderate >50%; 52-55 lower >25%). Pt returned to room and thickened his soda without assistance. Pt left sitting on EOB with all needs within reach & bed alarm set.    Therapy Documentation Precautions:  Precautions Precautions: Fall Precaution Comments: trach Restrictions Weight Bearing Restrictions: No  Pain: No c/o reported.   See Function Navigator for Current Functional Status.   Therapy/Group: Individual Therapy  Sandi Mariscal 01/18/2017, 11:01 AM

## 2017-01-18 NOTE — Progress Notes (Signed)
[place] PHYSICAL MEDICINE & REHABILITATION     PROGRESS NOTE    Subjective/Complaints: Sitting in bed waiting for breakfast. Denies any new problems. Denies pain  ROS: pt denies nausea, vomiting, diarrhea, cough, shortness of breath or chest pain  Objective: Vital Signs: Blood pressure 122/81, pulse 67, temperature 98.6 F (37 C), temperature source Oral, resp. rate 18, height 5\' 10"  (1.778 m), weight 64 kg (141 lb 1.5 oz), SpO2 98 %. No results found. No results for input(s): WBC, HGB, HCT, PLT in the last 72 hours. No results for input(s): NA, K, CL, GLUCOSE, BUN, CREATININE, CALCIUM in the last 72 hours.  Invalid input(s): CO CBG (last 3)  No results for input(s): GLUCAP in the last 72 hours.  Wt Readings from Last 3 Encounters:  01/17/17 64 kg (141 lb 1.5 oz)  01/12/17 69.2 kg (152 lb 8.9 oz)    Physical Exam:  Constitutional: He appears well-developed and well-nourished.   HENT:  Head: Normocephalic and atraumatic.  Eyes: Left eye opacified Left eye opaque. Right pupil round and reactive to light.   Neck: Normal range of motion. Still leaks air when speaking--doesn't self-occlude Cardiovascular:RRR Respiratory: clear, normal effort  GI: Soft. Bowel sounds are normal. He exhibits no distension. There is no tenderness.  Musculoskeletal: He exhibits no edema , mild calf tenderness Neurological: He is alert and oriented to self-place    Speech clear. Able to follow basic motor commands without difficulty.  Decreased safety awareness . Strength grossly 4/5 prox to distal in all 4's. Sensation decreased distal right foot and hand---no change Skin: Skin is warm and dry. No rash noted. No erythema. Necrosis of multiple finger tips of right hand and 5th digit right foot. Eschar bilateral heels also--stable---no further breakdown or drainage,odor Psychiatric: affect blunt, cooperative.       Assessment/Plan: 1. Functional, cognitive, and mobility deficits secondary to  anoxic encephalpathy which require 3+ hours per day of interdisciplinary therapy in a comprehensive inpatient rehab setting. Physiatrist is providing close team supervision and 24 hour management of active medical problems listed below. Physiatrist and rehab team continue to assess barriers to discharge/monitor patient progress toward functional and medical goals.  Function:  Bathing Bathing position Bathing activity did not occur: Refused (per pt report already performed sink bath reaching 10/10 body parts) Position: Standing at sink  Bathing parts Body parts bathed by patient: Right arm, Left arm, Chest, Abdomen, Front perineal area, Buttocks, Right upper leg, Left upper leg, Right lower leg, Left lower leg Body parts bathed by helper: Back  Bathing assist Assist Level: Supervision or verbal cues      Upper Body Dressing/Undressing Upper body dressing   What is the patient wearing?: Pull over shirt/dress     Pull over shirt/dress - Perfomed by patient: Thread/unthread right sleeve, Thread/unthread left sleeve, Put head through opening, Pull shirt over trunk          Upper body assist Assist Level: Supervision or verbal cues      Lower Body Dressing/Undressing Lower body dressing   What is the patient wearing?: Pants, Socks, Shoes     Pants- Performed by patient: Thread/unthread right pants leg, Thread/unthread left pants leg, Pull pants up/down, Fasten/unfasten pants       Socks - Performed by patient: Don/doff right sock, Don/doff left sock   Shoes - Performed by patient: Don/doff right shoe, Don/doff left shoe            Lower body assist Assist for lower body dressing:  Touching or steadying assistance (Pt > 75%)      Toileting Toileting   Toileting steps completed by patient: Adjust clothing prior to toileting, Performs perineal hygiene, Adjust clothing after toileting   Toileting Assistive Devices: Grab bar or rail  Toileting assist Assist level: Supervision  or verbal cues   Transfers Chair/bed transfer   Chair/bed transfer method: Ambulatory Chair/bed transfer assist level: Supervision or verbal cues       Locomotion Ambulation     Max distance: >150 ft Assist level: Supervision or verbal cues   Wheelchair          Cognition Comprehension Comprehension assist level: Follows basic conversation/direction with no assist  Expression Expression assist level: Expresses basic needs/ideas: With no assist  Social Interaction Social Interaction assist level: Interacts appropriately 50 - 74% of the time - May be physically or verbally inappropriate.  Problem Solving Problem solving assist level: Solves basic 50 - 74% of the time/requires cueing 25 - 49% of the time  Memory Memory assist level: Recognizes or recalls 50 - 74% of the time/requires cueing 25 - 49% of the time   Medical Problem List and Plan: 1.  Cognitive deficits, decreased balance secondary to anoxic encephalopathy  -CIR PT, OT, SLP therapies 2VTE Prophylaxis/Anticoagulation: will start Pharmaceutical: Lovenox     3. Pain Management: reasonable control  -denies pain in hands/feet at this point 4. Mood: LCSW to follow for evaluation and support as mentation improves.  5. Neuropsych: This patient is not capable of making decisions on his own behalf. 6. Skin/Wound Care: Pressure relief measures. Maintain adequate  Nutritional and hydration status.   -necrotic fingers/toe/foot---local care and observation----no acute intervention otherwise required, Eschar heels- PRAFO while in bed  -probably will need surgical intervention for some of fingers unfortunately 7. Fluids/Electrolytes/Nutrition: encourage PO  -replete K+  - .  8. HCAP/Aspiration PNA?: Continue Levaquin-- continue for 10 days and stop 9. Hypoxic respiratory failure: t  -decannulated. Continue occlusive dressing---need pink tape  -reiterated need to occlude stoma with finger to prevent air leakage  10. Dysphagia:  Continue dysphagia 3, nectar liquids. Encourage compliance with modified diet.  11. Thrombocytopenia:Has resolved.  12. Dry gangrene right hand: Continue to monitor. Keep dry, no evidence of infection yet. 13. Hyponatremia: Slowly improving as diarrhea has resolved.   -sodium holding at 133 --recheck Thursday 14. Polysubstance abuse: smoking cessation counseling  LOS (Days) 6 A FACE TO FACE EVALUATION WAS PERFORMED  Ranelle Oyster, MD 01/18/2017 9:19 AM
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
## 2017-01-18 NOTE — Progress Notes (Signed)
Occupational Therapy Session Note  Patient Details  Name: Troy Brennan MRN: [id number] Date of Birth: 03-27-1971  Today's Date: 01/18/2017 OT Individual Time: 0700-0800 OT Individual Time Calculation (min): 60 min    Short Term Goals: Week 1:  OT Short Term Goal 1 (Week 1): STGs=LTGs d/t LOS  Skilled Therapeutic Interventions/Progress Updates:    Pt asleep upon arrival but easily aroused.  Pt declined bathing and changing clothing this morning but agreeable to shower tomorrow.  Pt requested use of toilet prior to amb to gym.  Pt initially engaged in BLE therex and transitioned to dynamic standing tasks on compliant surface to complete pipe tree structures and using colored peg board to replicate patterns.  Pt transitioned to Biodex and pt recalled using in prior therapy.  Pt noted with severely limited ability to shift weight R<>L and onto toes/heels.  Pt returned to room and sat EOB awaiting breakfast.  Focus on activity tolerance, dynamic standing balance, and safety awareness to increase independence with BADLs.   Therapy Documentation Precautions:  Precautions Precautions: Fall Precaution Comments: trach Restrictions Weight Bearing Restrictions: No   Pain: Pain Assessment Pain Score: Asleep  See Function Navigator for Current Functional Status.   Therapy/Group: Individual Therapy  Rich Brave 01/18/2017, 8:07 AM

## 2017-01-18 NOTE — Progress Notes (Deleted)
Occupational Therapy Note  Patient Details  Name: Troy Brennan MRN: [id number] Date of Birth: 03/02/1971  Today's Date: 01/18/2017 OT Individual Time: 8546-2703 OT Individual Time Calculation (min): 45 min   Pt denied pain Individual Therapy     Rich Brave 01/18/2017, 1:46 PM

## 2017-01-18 NOTE — Progress Notes (Signed)
Speech Language Pathology Note  Patient Details  Name: Jakori Shedden MRN: [id number] Date of Birth: 24-Oct-1971 Today's Date: 01/18/2017  MBSS complete. Full report located under chart review in imaging section.    Santo Zahradnik 01/18/2017, 3:19 PM

## 2017-01-18 NOTE — Progress Notes (Signed)
Occupational Therapy Note  Patient Details  Name: Ayzen Dondiego MRN: [id number] Date of Birth: [birth year]  Today's Date: 01/18/2017 OT Individual Time: 1300-1345 OT Individual Time Calculation (min): 45 min   Pt denied pain Individual Therapy  Pt sitting EOB upon arrival with RN present.  Pt initially engaged in water protocol.  Pt performed adequate oral cleansing while standing at sink prior to drinking water.  Pt required verbal cues for following swallowing strategies.  Pt amb without AD to therapy gym and engaged in dynamic standing tasks using Wii on compliant and noncompliant surfaces (boxing and baseball).  Pt required rest breaks between activities.  Pt commented that his legs fatigued quickly.  Pt returned to room and remained in bed with bed alarm activated.  All needs within reach.  Focus on functional amb, dynamics standing balance, activity tolerance, and safety awareness to increase independence with BADLs.   Lavone Neri [facility] 01/18/2017, 2:43 PM

## 2017-01-19 ENCOUNTER — Inpatient Hospital Stay (HOSPITAL_COMMUNITY): Payer: Self-pay | Admitting: Speech Pathology

## 2017-01-19 ENCOUNTER — Inpatient Hospital Stay (HOSPITAL_COMMUNITY): Payer: Self-pay

## 2017-01-19 ENCOUNTER — Inpatient Hospital Stay (HOSPITAL_COMMUNITY): Payer: Self-pay | Admitting: Physical Therapy

## 2017-01-19 LAB — CBC
HEMATOCRIT: 38.7 % — AB (ref 39.0–52.0)
Hemoglobin: 12.5 g/dL — ABNORMAL LOW (ref 13.0–17.0)
MCH: 27.3 pg (ref 26.0–34.0)
MCHC: 32.3 g/dL (ref 30.0–36.0)
MCV: 84.5 fL (ref 78.0–100.0)
Platelets: 317 10*3/uL (ref 150–400)
RBC: 4.58 MIL/uL (ref 4.22–5.81)
RDW: 12.4 % (ref 11.5–15.5)
WBC: 6.9 10*3/uL (ref 4.0–10.5)

## 2017-01-19 LAB — BASIC METABOLIC PANEL
ANION GAP: 9 (ref 5–15)
BUN: 8 mg/dL (ref 6–20)
CO2: 25 mmol/L (ref 22–32)
Calcium: 8.8 mg/dL — ABNORMAL LOW (ref 8.9–10.3)
Chloride: 104 mmol/L (ref 101–111)
Creatinine, Ser: 0.77 mg/dL (ref 0.61–1.24)
GFR calc Af Amer: >60 mL/min (ref >60)
GFR calc non Af Amer: >60 mL/min (ref >60)
Glucose, Bld: 85 mg/dL (ref 65–99)
POTASSIUM: 4.1 mmol/L (ref 3.5–5.1)
SODIUM: 138 mmol/L (ref 135–145)

## 2017-01-19 NOTE — Progress Notes (Signed)
Social Work Patient ID: Troy Brennan, male   DOB: 1971/02/09, 46 y.o.   MRN: [id number]   Met with pt and spoke with his mother, via phone, following team conference.  Both aware and agreeable with targeted d/c tomorrow.  Both confirming plan for pt to return home with his mother and step-father.  I have spoken with the [facility] and the [place] about follow up medical home and OPST services.  I will review information and access instructions with pt and mother tomorrow prior to d/c.  Strider Vallance, LCSW

## 2017-01-19 NOTE — Progress Notes (Signed)
Occupational Therapy Session Note  Patient Details  Name: Troy Brennan MRN: [id number] Date of Birth: April 12, 1971  Today's Date: 01/19/2017 OT Individual Time: 4854-6270 OT Individual Time Calculation (min): 54 min    Short Term Goals: Week 1:  OT Short Term Goal 1 (Week 1): STGs=LTGs d/t LOS  Skilled Therapeutic Interventions/Progress Updates:    Pt engaged in BADL retraining including bathing at shower level and dressing with sit<>stand from EOB.  Pt amb in room to gather supplies and clothing prior to entering bathroom to use toilet and take shower.  Pt completed shower while standing with no unsafe behaviors.  Pt placed soiled towels/wash cloths in dirty linen prior to entering room.  Pt completed all dressing and grooming tasks in standing with the exception of donning socks and shoes.  Pt exhibited no LOB during tasks.  Continued discharge planning and safety recommendations.  Pt pleased with progress and happy to be discharging home tomorrow.    Therapy Documentation Precautions:  Precautions Precautions: Fall Precaution Comments: trach Restrictions Weight Bearing Restrictions: No Pain:  Pt denied pain  See Function Navigator for Current Functional Status.   Therapy/Group: Individual Therapy  Rich Brave 01/19/2017, 8:55 AM
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
## 2017-01-19 NOTE — Progress Notes (Signed)
Speech Language Pathology Daily Session Note  Patient Details  Name: Eliyah Stadler MRN: [id number] Date of Birth: 1970-11-27  Today's Date: 01/19/2017  Session 1: SLP Individual Time: 0730-0800 SLP Individual Time Calculation (min): 30 min   Session 2: SLP Individual Time: [address] SLP Individual Time Calculation (min): 20 min  Short Term Goals: Week 1: SLP Short Term Goal 1 (Week 1): STG=LTGs  Skilled Therapeutic Interventions:  Session 1: Skilled treatment session focused on dysphagia goals. Patient consumed breakfast meal of Dys. 3 textures with trials of thin liquids with an intermittent wet vocal quality and Max A verbal cues needed for use of swallowing compensatory strategies. Recommend patient remain on nectar-thick liquids due to decreased recall/use of strategies which increases his overall aspiration risk. Patient verbalized understanding. Patient left supine in bed with all needs within reach. Continue with current plan of care.   Session 2: Skilled treatment session focused on dysphagia goals. Education provided to patient and his girlfriend in regards to current swallowing function, medication administration, diet recommendations, appropriate textures, swallowing compensatory strategies, water protocol and how to appropriately thicken liquids. Handouts also given to reinforce information. Patient verbalized understanding and appropriately thickened liquids to a nectar-thick consistency. Patient left sitting EOB with visitor present. Continue with current plan of care.      Function:  Eating Eating   Modified Consistency Diet: Yes Eating Assist Level: Supervision or verbal cues           Cognition Comprehension Comprehension assist level: Follows basic conversation/direction with no assist  Expression   Expression assist level: Expresses basic needs/ideas: With no assist  Social Interaction Social Interaction assist level: Interacts appropriately 90% of the  time - Needs monitoring or encouragement for participation or interaction.  Problem Solving Problem solving assist level: Solves basic 90% of the time/requires cueing < 10% of the time  Memory Memory assist level: Recognizes or recalls 90% of the time/requires cueing < 10% of the time    Pain No reports of pain   Therapy/Group: Individual Therapy  Tytianna Greenley 01/19/2017, 4:19 PM

## 2017-01-19 NOTE — Progress Notes (Signed)
Physical Therapy Discharge Summary  Patient Details  Name: Troy Brennan MRN: [id number] Date of Birth: 06/17/1971  Patient has met 10 of 10 long term goals due to improved activity tolerance, improved balance, improved postural control, ability to compensate for deficits and functional use of  right upper extremity, right lower extremity, left upper extremity and left lower extremity.  Patient to discharge at an ambulatory level Independent.   Patient's care partner unavailable to provide the necessary cognitive assistance at discharge.  Reasons goals not met: n/a  Recommendation:  Patient will benefit from ongoing skilled PT services in outpatient setting to continue to advance safe functional mobility, address ongoing impairments in high level balance, pain bil heels and R hand, activity tolerance, and minimize fall risk.  Equipment: No equipment provided  Reasons for discharge: treatment goals met and discharge from hospital  Patient/family agrees with progress made and goals achieved: Yes  PT Discharge Precautions/Restrictions Precautions Precautions: Fall Restrictions Weight Bearing Restrictions: No   Pain Pain Assessment Pain Assessment: 0-10 Pain Score: 7 Pain Type: Acute pain Pain Location: bil heels, R finger tips Pain Orientation: Right;Left Pain Frequency: Constant Pain Onset: On-going Patients Stated Pain Goal: 1 Pain Intervention(s): Medication (See eMAR) Vision/Perception - blind L eye, opaque.  Pt reported no changes in vision.    Cognition Overall Cognitive Status: Impaired/Different from baseline Arousal/Alertness: Awake/alert Orientation Level: Oriented X4 Attention: Selective Sustained Attention: Appears intact Selective Attention: Appears intact Memory: Appears intact Awareness: Impaired Awareness Impairment: Anticipatory impairment Problem Solving: Appears intact Safety/Judgment: Impaired; impulsive Sensation Sensation Light Touch:  Appears Intact (reports some numbness in bilateral feet) Stereognosis: Not tested Hot/Cold: Appears Intact Proprioception: Appears Intact Coordination Gross Motor Movements are Fluid and Coordinated: Yes Fine Motor Movements are Fluid and Coordinated: Yes Motor  Motor Motor: Motor impersistence Motor - Discharge Observations: trembling RLE during standing exs  Mobility Bed Mobility Rolling Right: 6: Modified independent (Device/Increase time) Rolling Left: 6: Modified independent (Device/Increase time) Right Sidelying to Sit: 6: Modified independent (Device/Increase time) Left Sidelying to Sit: 6: Modified independent (Device/Increase time) Supine to Sit: 6: Modified independent (Device/Increase time) Sit to Supine: 6: Modified independent (Device/Increase time) Transfers Transfers: Yes Sit to Stand: 6: Modified independent (Device/Increase time) Stand to Sit: 6: Modified independent (Device/Increase time) Locomotion  Ambulation Ambulation: Yes Ambulation/Gait Assistance: 7: Independent Ambulation Distance (Feet): 300 Feet Assistive device: None Gait Gait: Yes Gait Pattern: Impaired Gait Pattern: Step-through pattern;Decreased weight shift to right;Decreased weight shift to left;Antalgic;Decreased trunk rotation Gait velocity: 10 MWT 11 seconds; = 2.98'/sec Stairs / Additional Locomotion Stairs: Yes Stairs Assistance: 6: Modified independent (Device/Increase time) Stair Management Technique: One rail Right;Alternating pattern Number of Stairs: 12 Height of Stairs: 6 (and 3) Wheelchair Mobility Wheelchair Mobility: No  Trunk/Postural Assessment  Cervical Assessment Cervical Assessment: Within Functional Limits Thoracic Assessment Thoracic Assessment: Within Functional Limits Lumbar Assessment Lumbar Assessment: Within Functional Limits Postural Control Postural Control: Within Functional Limits  Balance Standardized Balance Assessment Standardized Balance  Assessment: (P) Dynamic Gait Index Static Sitting Balance Static Sitting - Balance Support: No upper extremity supported Static Sitting - Level of Assistance: 7: Independent Dynamic Sitting Balance Dynamic Sitting - Balance Support: No upper extremity supported Dynamic Sitting - Level of Assistance: 7: Independent Static Standing Balance Static Standing - Level of Assistance: 7: Independent Dynamic Standing Balance Dynamic Standing - Level of Assistance: 6: Modified independent (Device/Increase time) Extremity Assessment  RUE Assessment RUE Assessment: Within Functional Limits LUE Assessment LUE Assessment: Within Functional Limits RLE Assessment RLE Assessment:  (  grossly 4/5) LLE Assessment LLE Assessment:  (grossly 4/5)   See Function Navigator for Current Functional Status.  Chrystel Barefield 01/19/2017, 12:07 PM

## 2017-01-19 NOTE — Progress Notes (Addendum)
Physical Therapy Session Note  Patient Details  Name: Troy Brennan MRN: [id number] Date of Birth: September 05, 1971  Today's Date: 01/19/2017 PT Individual Time: 9381-8299 PT Individual Time Calculation (min): 28 min   Short Term Goals: Week 1:  PT Short Term Goal 1 (Week 1): STG's equal LTGs due to estimated length of stay.   Skilled Therapeutic Interventions/Progress Updates:    Session focused on addressing grad day activities to prepare for discharge including basic transfers and bed mobility, gait without AD on unit, stair negotiation in stairwell with use of rail for support, simulated car transfer, floor transfers with review on what to do in case of fall, up/down ramp and over uneven surfaces to simulate community mobility, picking up item off of floor, and dynamic balance during gait. Pt completed mobility at overall independent to modified independent level. Antalgic gait noted which patient reports due to soreness on feet. Denies concerns in regards to d/c tomorrow.   Therapy Documentation Precautions:  Precautions Precautions: Fall Restrictions Weight Bearing Restrictions: No  Pain: Denies pain. Locomotion : Gait Gait velocity: 2.46 ft/sec   See Function Navigator for Current Functional Status.   Therapy/Group: Individual Therapy  Karolee Stamps Darrol Poke, PT, DPT  01/19/2017, 11:25 AM

## 2017-01-19 NOTE — Discharge Summary (Signed)
Physician Discharge Summary  Patient ID: Rockwell Zentz MRN: [id number] DOB/AGE: [birth year] 46 y.o.  Admit date: 01/12/2017 Discharge date: 01/20/2017  Discharge Diagnoses:  Principal Problem:   Anoxic brain injury [facility]) Active Problems:   Acute respiratory failure (HCC)   Substance abuse   Leukocytosis   Discharged Condition:  Stable   Significant Diagnostic Studies:  Mr Laqueta Jean Wo Contrast  Result Date: 12/28/2016 CLINICAL DATA:  Acute encephalopathy. Alcohol and polysubstance abuse. Recently discharged from detox facility. Status post cardiac arrest. EXAM: MRI HEAD WITHOUT AND WITH CONTRAST TECHNIQUE: Multiplanar, multiecho pulse sequences of the brain and surrounding structures were obtained without and with intravenous contrast. CONTRAST:  18mL MULTIHANCE GADOBENATE DIMEGLUMINE 529 MG/ML IV SOLN COMPARISON:  Head CT 12/21/2016 FINDINGS: Brain: There is bilateral diffusion restriction within the basal ganglia. No other sites of abnormal diffusion are identified. There is associated bilateral basal ganglia hyperintense T2 weighted signal. The remainder of the brain parenchymal signal is normal. There is mild susceptibility within both basal ganglia. No mass lesion or midline shift. No hydrocephalus or extra-axial fluid collection. The midline structures are normal. No age advanced or lobar predominant atrophy. There is faint contrast enhancement at the sites of ischemia. Vascular: Major intracranial arterial and venous sinus flow voids are preserved. No evidence of chronic microhemorrhage or amyloid angiopathy. Skull and upper cervical spine: The visualized skull base, calvarium, upper cervical spine and extracranial soft tissues are normal. Sinuses/Orbits: No fluid levels or advanced mucosal thickening. No mastoid effusion. Normal orbits. IMPRESSION: 1. Bilateral acute to early subacute ischemia within the basal ganglia. This pattern is most consistent with hypoxic ischemic injury,  particularly in the context of recent cardiac arrest. 2. Suspected petechial hemorrhage within both basal ganglia. No space-occupying hematoma. 3. No midline shift or other mass effect. Electronically Signed   By: Deatra Robinson M.D.   On: 12/28/2016 00:01   Dg Chest Port 1 View  Result Date: 01/08/2017 CLINICAL DATA:  Fever. EXAM: PORTABLE CHEST 1 VIEW COMPARISON:  Radiograph of January 04, 2017. FINDINGS: The heart size and mediastinal contours are within normal limits. Tracheostomy is in grossly good position. No pneumothorax or pleural effusion is noted. Left lung is clear. Mild right basilar opacity is noted concerning for possible pneumonia. The visualized skeletal structures are unremarkable. IMPRESSION: Mild right basilar opacity concerning for possible pneumonia. Followup PA and lateral chest X-ray is recommended in 3-4 weeks following trial of antibiotic therapy to ensure resolution and exclude underlying malignancy. Electronically Signed   By: Lupita Raider, M.D.   On: 01/08/2017 15:17     Labs:  Basic Metabolic Panel: BMP Latest Ref Rng & Units 01/19/2017 01/13/2017 01/11/2017  Glucose 65 - 99 mg/dL 85 409(W) 93  BUN 6 - 20 mg/dL 8 5(L) 7  Creatinine 1.19 - 1.24 mg/dL 1.47 8.29 5.62  Sodium 135 - 145 mmol/L 138 133(L) 133(L)  Potassium 3.5 - 5.1 mmol/L 4.1 3.4(L) 3.7  Chloride 101 - 111 mmol/L 104 101 101  CO2 22 - 32 mmol/L 25 24 24   Calcium 8.9 - 10.3 mg/dL 1.3(Y) 8.3(L) 8.5(L)     CBC: CBC Latest Ref Rng & Units 01/19/2017 01/13/2017 01/12/2017  WBC 4.0 - 10.5 K/uL 6.9 8.5 8.4  Hemoglobin 13.0 - 17.0 g/dL 12.5(L) 11.1(L) 11.2(L)  Hematocrit 39.0 - 52.0 % 38.7(L) 34.7(L) 34.4(L)  Platelets 150 - 400 K/uL 317 298 287    CBG: No results for input(s): GLUCAP in the last 168 hours.    Brief HPI:  Quason Lauritsen is a 46 year old male with history of ETOH and polysubstance abuse who was  was admitted on 12/21/16 after found unresponsive by girlfriend.  GF reported 2-3 day history  of malaise, cough and fever and denied drug/ETOH use since discharge.    CPR performed X 10-15 minutes with return of pulse.  He was intubated in ED and UDS postivite for benzos, barbiturates and THC.   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 2 D echo revealed EF 15-20% with severe diffuse hypokinesis, left pleural effusion and normal wall thickness/cavity.  MRI brain done 2/20 revealing bilateral acute to early subacute ischemia within basal ganglia consistent with hypoxic ischemic injury and suspected petechial hemorrhage within both basal ganglia.  He required tracheostomy 2/22 prior to extubation to ATC.  He continued on Levaquin for PNA. Has been non-compliant with dysphagia 3, nectar liquids and remains at aspiration risk. He was started on button plugging as respiratory status stable. Therapy ongoing and patient limited by cognitive deficits as well as deficits in ability to carry out ADL tasks and safety with mobility. CIR was recommended for follow up therapy   Hospital Course: Taevon Sendra was admitted to rehab 01/12/2017 for inpatient therapies to consist of PT, ST and OT at least three hours five days a week. Past admission physiatrist, therapy team and rehab RN have worked together to provide customized collaborative inpatient rehab. He was maintained on Levaquin for 10 days total antibiotic therapy. Follow up CBC showed white count is stable and H/H is improving. He has been afebrile during his stay and blood pressures and heart rate have been WNL.  He tolerated capping trials and was decannulated on 3/9. Respiratory status has been stable and stoma is closing in.   He continues to have decreased sensation in right hand and feet with dry gangrene right fifth toe and at finger tips of right hand.  Follow up MBS repeated on 3/14  showing mild improvement in sensorimotor pharyngeal dysphagia with moderate aspiration risk. He is to continue on dysphagia 3 with nectar liquids. He has been cleared for water protocol after oral care and family has been educated on precautions. His endurance has improved and no reports of chest pain/discomfort with increase in activity. He has progressed to modified independent level in supervised setting for mobility and supervision is recommended for safety. Follow up speech therapy recommended after discharge and he is to follow up with [facility] after discharge.    Rehab course: During patient's stay in rehab weekly team conferences were held to monitor patient's progress, set goals and discuss barriers to discharge. At admission, patient required min assist with mobility and supervision with basic self care tasks. His speech was 100% intelligible and he demonstrated moderate cognitive impairments impacting problem solving, emergent awareness, selective attention, and recall which impacted his ability to complete functional and familiar tasks safely.  He has had improvement in activity tolerance, balance, postural control, as well as ability to compensate for deficits. He is able to complete ADL tasks at modified independent level. He is able to ambulate over multiple surfaces and climb stairs at modified independent level. He is able to use safe swallow startergies at modified independent level. He requires supervision to min assist for complete functional and familiar tasks safely in regards to problem solving, recall, attention and awareness. Family education  was completed regarding safety and cognition and family to provide supervision due to safety concerns.      Disposition: Home   Diet: Dysphagia 3 (soft foods) with nectar liquids. Water protocol after oral care.  No straws.   Special Instructions: 1. Needs follow up PA/Lateral CXR in 2-3 weeks to ensure resolution.   2.  Absolutely no alcohol or illicit drugs. 3. Needs 24 hours supervision. No driving or strenuous activity.  4. Will need follow up with vascular surgeon for monitoring of ischemic finger tips and left toe.  5. Will need cardiology follow up and repeat echo in next few weeks.    Allergies as of 01/20/2017   No Known Allergies     Medication List    STOP taking these medications   acetaminophen 650 MG suppository Commonly known as:  TYLENOL Replaced by:  acetaminophen 160 MG/5ML solution   levofloxacin 500 MG tablet Commonly known as:  LEVAQUIN   metoprolol tartrate 25 mg/10 mL Susp Commonly known as:  LOPRESSOR Replaced by:  metoprolol tartrate 25 MG tablet   pantoprazole sodium 40 mg/20 mL Pack Commonly known as:  PROTONIX     TAKE these medications   acetaminophen 160 MG/5ML solution Commonly known as:  TYLENOL Take 20.3 mLs (650 mg total) by mouth every 6 (six) hours as needed for mild pain or headache. Replaces:  acetaminophen 650 MG suppository   aspirin 81 MG chewable tablet Chew 1 tablet (81 mg total) by mouth daily.   metoprolol tartrate 25 MG tablet Commonly known as:  LOPRESSOR Take 1 tablet (25 mg total) by mouth 2 (two) times daily. Replaces:  metoprolol tartrate 25 mg/10 mL Susp   MUSCLE RUB 10-15 % Crea Apply 1 application topically 2 (two) times daily.   RESOURCE THICKENUP CLEAR Powd Follow instructions on can to thicken liquids to nectar thick consistency      Follow-up Information    Ranelle Oyster, MD Follow up.   Specialty:  Physical Medicine and Rehabilitation Why:  office will call you with folllow up appointment Contact information: [address] [place] [address] [phone]        [facility] Follow up.   Why:  Go to the clinic and pick up an UPDATED application packet.  Complete and return the packet and then get scheduled for a new patient appointment.  This can be your primary medical care  location and they can assist with medications as well. Contact information: [address] [place], [place]   [address] [phone]          Signed: Jacquelynn Cree 01/20/2017, 7:07 PM
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
## 2017-01-19 NOTE — Progress Notes (Signed)
Physical Therapy Note  Patient Details  Name: Troy Brennan MRN: [id number] Date of Birth: 1971/04/09 Today's Date: 01/19/2017  1100-1200, 60 min individual tx Pain 7/10 bil heels, R fingertips; premedicated  Pt is now independent in room.   Gait throughout unit on level tile and steps,without AD.  DGI score 17/24.  Discussed falls risk with pt.  With external perturvations, pt demonstrated functional bil ankle, hip strategies, and R stepping strategy.  Reciprocal scooting in unsupported sitting x 3 cycles for core activation. Discussed fitness for life, especially smoking due to need to heel necrotic tissues as well as possible bil heels and R fingers. standing bil heel raises, mini squats, 10 x 1 each; standing hip abduction R/L; supiine bil bridging,  2x 10 each.  Pt left resting in bed , all needs within reach.  See function navigator for current status.    [name] 01/19/2017, 11:22 AM

## 2017-01-20 ENCOUNTER — Inpatient Hospital Stay (HOSPITAL_COMMUNITY): Payer: Self-pay | Admitting: Speech Pathology

## 2017-01-20 MED ORDER — ACETAMINOPHEN 160 MG/5ML PO SOLN: 650.0000 mg | Freq: Four times a day (QID) | ORAL | 0 refills | Status: AC | PRN

## 2017-01-20 MED ORDER — MUSCLE RUB 10-15 % EX CREA: 1.0000 "application " | TOPICAL_CREAM | Freq: Two times a day (BID) | CUTANEOUS | 0 refills | Status: AC

## 2017-01-20 MED ORDER — RESOURCE THICKENUP CLEAR PO POWD: ORAL | 1 refills | Status: AC

## 2017-01-20 MED ORDER — METOPROLOL TARTRATE 25 MG PO TABS: 25.0000 mg | ORAL_TABLET | Freq: Two times a day (BID) | ORAL | 0 refills | Status: AC

## 2017-01-20 MED FILL — METOPROLOL TARTRATE 25 MG T: 25 | 30 days supply | Qty: 60 | Fill #0

## 2017-01-20 NOTE — Plan of Care (Signed)
Problem: RH PAIN MANAGEMENT Goal: RH STG PAIN MANAGED AT OR BELOW PT'S PAIN GOAL Pain less than 7  Outcome: Not Met (add Reason) Patient has wounds that are not healing  Comments: Patient has wounds to bil heels & progressive ischemia to the right hand.

## 2017-01-20 NOTE — Discharge Instructions (Signed)
Inpatient Rehab Discharge Instructions  [facility] Discharge date and time:  01/20/17  Activities/Precautions/ Functional Status: Activity: No lifting, driving, or strenuous exercise  till cleared by MD.  Diet: Soft foods. Thicken liquids to nectar consistency (can use buttermilk, V8 or fruit nectar without thickening). NO STRAWS.  Wound Care: Keep wound (black areas) clean and dry --no lotions or ointments. Do not soak in water or other liquids. Contact MD if you develop swelling, redness or drainage.    Functional status:  ___ No restrictions     ___ Walk up steps independently _X__ 24/7 supervision/assistance   ___ Walk up steps with assistance ___ Intermittent supervision/assistance  ___ Bathe/dress independently ___ Walk with walker     ___ Bathe/dress with assistance ___ Walk Independently    ___ Shower independently ___ Walk with assistance    ___ Shower with assistance _X__ No alcohol     ___ Return to work/school ________    COMMUNITY REFERRALS UPON DISCHARGE:       Outpatient:   Your team is recommending Outpatient Speech Therapy to continue to monitor your swallow function and thinking/ memory skills.  In order to receive this at [facility], you need to do the following:                          1.  Contact [facility] [phone]), with the financial department and explain that you are needing to be seen in the outpatient rehab center and that you are uninsured.                                    You need to complete a "financial application" for free or reduced cost for the services.                         2.  Call Verlon Au @ the Outpatient Rehab Center [phone])  to get an appointment scheduled after the financial application has been done.            Special Instructions: 1. Will need to set follow up with primary MD (for follow up and monitoring of fingers tips and left toe), vascular surgery  and cardiology.  2. Needs follow up PA/Lateral CXR in 2 weeks to monitor for clearing.  3. Will need repeat 2 D echo for follow up on your heart.   4. Needs 24 hours supervision. No driving or strenuous activity.  5. Water protocol after oral care.  No straws. Needs supervision at meals for safety.   My questions have been answered and I understand these instructions. I will adhere to these goals and the provided educational materials after my discharge from the hospital.  Patient/Caregiver Signature _______________________________ Date __________  Clinician Signature _______________________________________ Date __________  Please bring this form and your medication list with you to all your follow-up doctor's appointments.

## 2017-01-20 NOTE — Progress Notes (Signed)
Social Work  Discharge Note  The overall goal for the admission was met for:   Discharge location: Yes - home with family in [place], [place]  Length of Stay: Yes - 8 days  Discharge activity level: Yes - supervision  Home/community participation: Yes  Services provided included: MD, RD, PT, OT, SLP, RN, TR, Pharmacy and SW  Financial Services: None - [facility] financial counseling monitoring for Medicaid and SSD appropriateness  Follow-up services arranged: Provided pt and mother with instructions on how to access [facility] for follow up ST  Comments (or additional information):  Instructed pt and mother on how to apply for service via [facility]  Patient/Family verbalized understanding of follow-up arrangements: Yes  Individual responsible for coordination of the follow-up plan: mother  Confirmed correct DME delivered: No needs    Benn Tarver

## 2017-01-20 NOTE — Progress Notes (Signed)
Speech Language Pathology Discharge Summary  Patient Details  Name: Troy Brennan MRN: [id number] Date of Birth: December 28, 1970  Patient has met 5 of 6 long term goals.  Patient to discharge at overall Supervision;Min level.   Reasons goals not met: Patient requires Max A for emergent awareness    Clinical Impression/Discharge Summary:  Patient has made functional gains and has met 5 of 6 LTG's this admission. Currently, patient is consuming regular textures with nectar-thick liquids with minimal overt s/s of aspiration and is Mod I for use of swallowing compensatory strategies. Patient had repeat MBS on 3/15 and continues to demonstrate silent penetration of thin liquids that can clear with a cued cough, however, patient demonstrated difficulty carrying over strategy of a volitional cough after every sip and it was recommended he continues nectar-thick liquids. Patient demonstrates a breathy vocal quality due to air escaping from stoma due to patient declining to apply pressure to dressing over stoma. Patient also requires overall supervision-Min A to complete functional and familiar tasks safely in regards to problem solving, recall, attention and awareness. Patient education is complete and patient will discharge home with family. Patient would benefit from f/u SLP services to maximize cognitive and swallowing function and overall functional independence in order to reduce caregiver burden.   Care Partner:  Caregiver Able to Provide Assistance: Yes  Type of Caregiver Assistance: Cognitive  Recommendation:  24 hour supervision/assistance;Outpatient SLP;Home Health SLP  Rationale for SLP Follow Up: Maximize cognitive function and independence;Maximize swallowing safety;Reduce caregiver burden   Equipment: N/A   Reasons for discharge: Discharged from hospital;Treatment goals met   Patient/Family Agrees with Progress Made and Goals Achieved: Yes   Alli Jasmer, [place] 01/20/2017, 12:30  PM

## 2017-01-20 NOTE — Progress Notes (Signed)
Pt discharged to home with family. Pt aware that speech therapy is scheduled for 11:30 but wishes to leave at this time. Speech therapist aware. Pt given discharge instructions via Delle Reining, PA-C.
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
## 2018-07-07 IMAGING — DX DG CHEST 1V PORT
1 series · 1 of 1 positions shown · non-contrast
Comparison: December 21, 2016 study obtained earlier in the day

CLINICAL DATA: Central catheter placed

EXAM:
PORTABLE CHEST 1 VIEW

[chest ap]
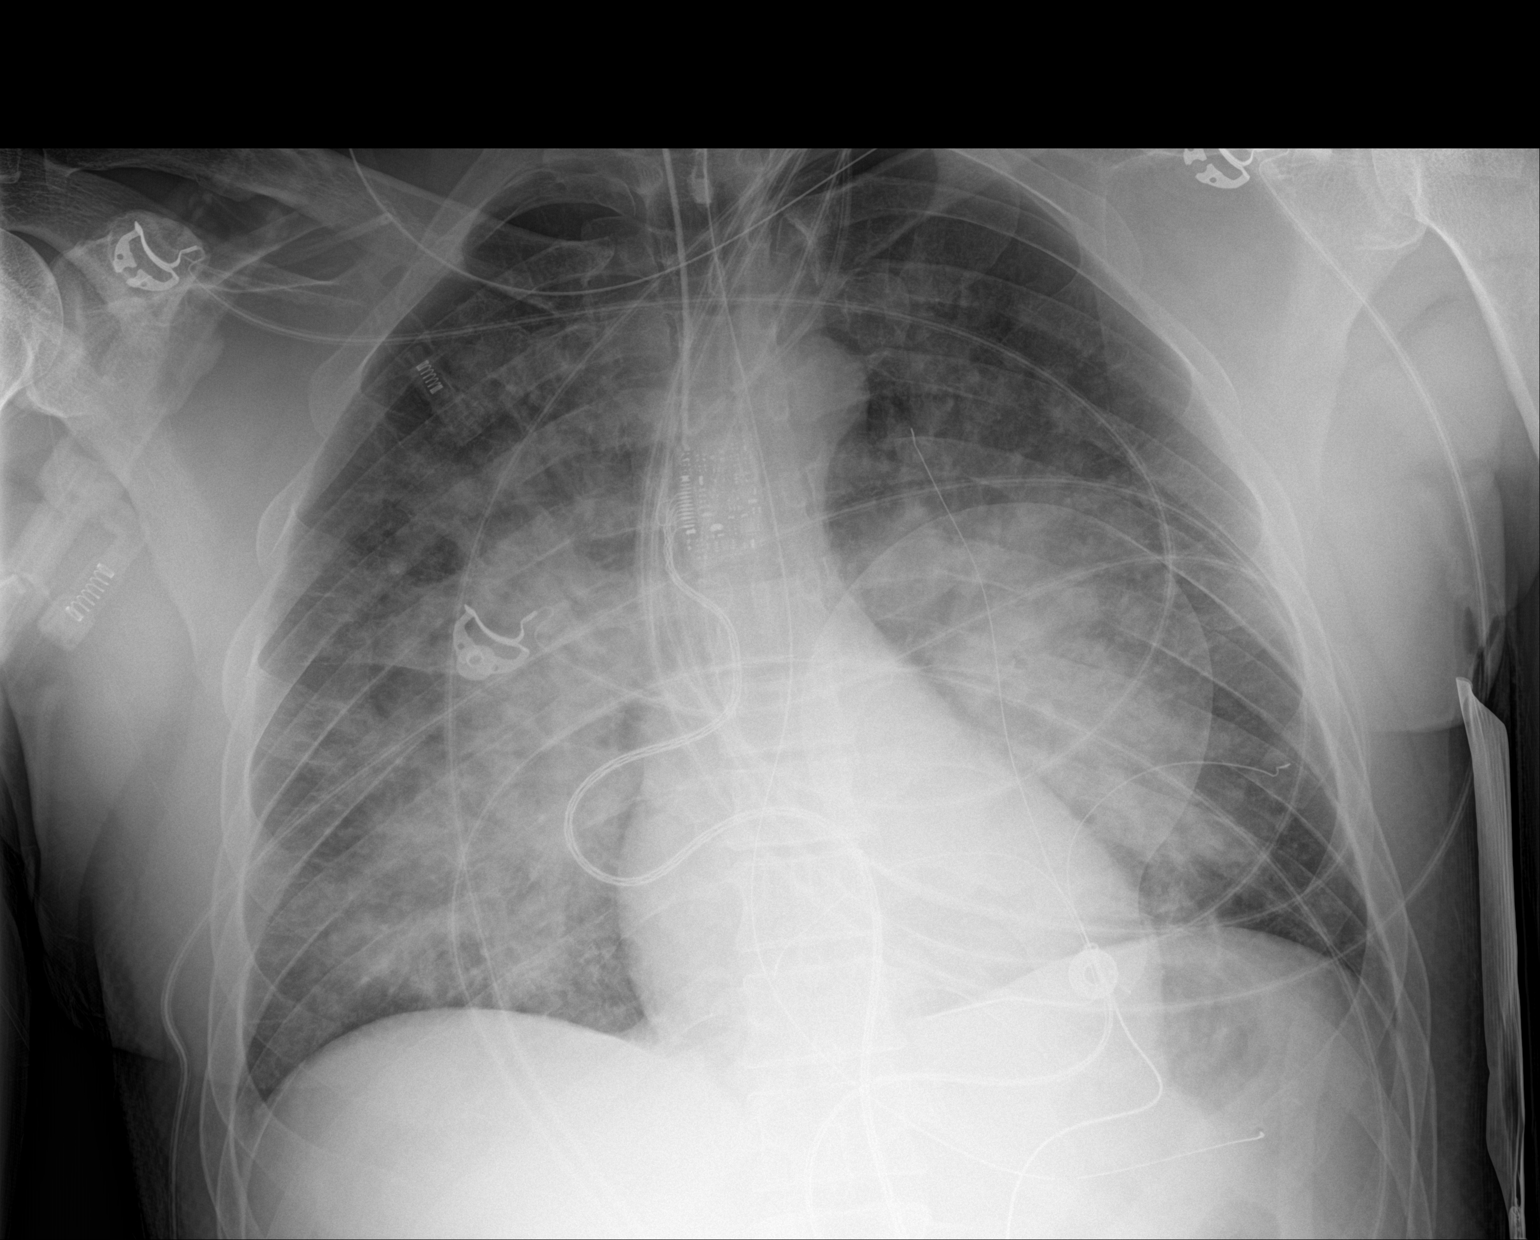

[1 of 1 positions shown; findings below may reference images not displayed]

FINDINGS: Central catheter tip is in the left innominate vein. Endotracheal
tube tip is 2.3 cm above the carina. Nasogastric tube tip and side
port in stomach. No pneumothorax. There is interstitial and alveolar
opacity in a perihilar distribution, stable from earlier in the day.
Heart size normal. Mild pulmonary venous hypertension. No adenopathy
evident. No bone lesions.
IMPRESSION: Tube and catheter positions as described without pneumothorax.
Perihilar interstitial alveolar opacity. Suspect noncardiogenic
pulmonary edema, although hemorrhage or atypical pneumonia could
present in this manner. Stable cardiac silhouette.

## 2018-07-07 IMAGING — CR DG CHEST 1V PORT
1 series · 1 of 1 positions shown · non-contrast
Comparison: None.

CLINICAL DATA: Cardiac arrest

EXAM:
PORTABLE CHEST 1 VIEW

[ap]
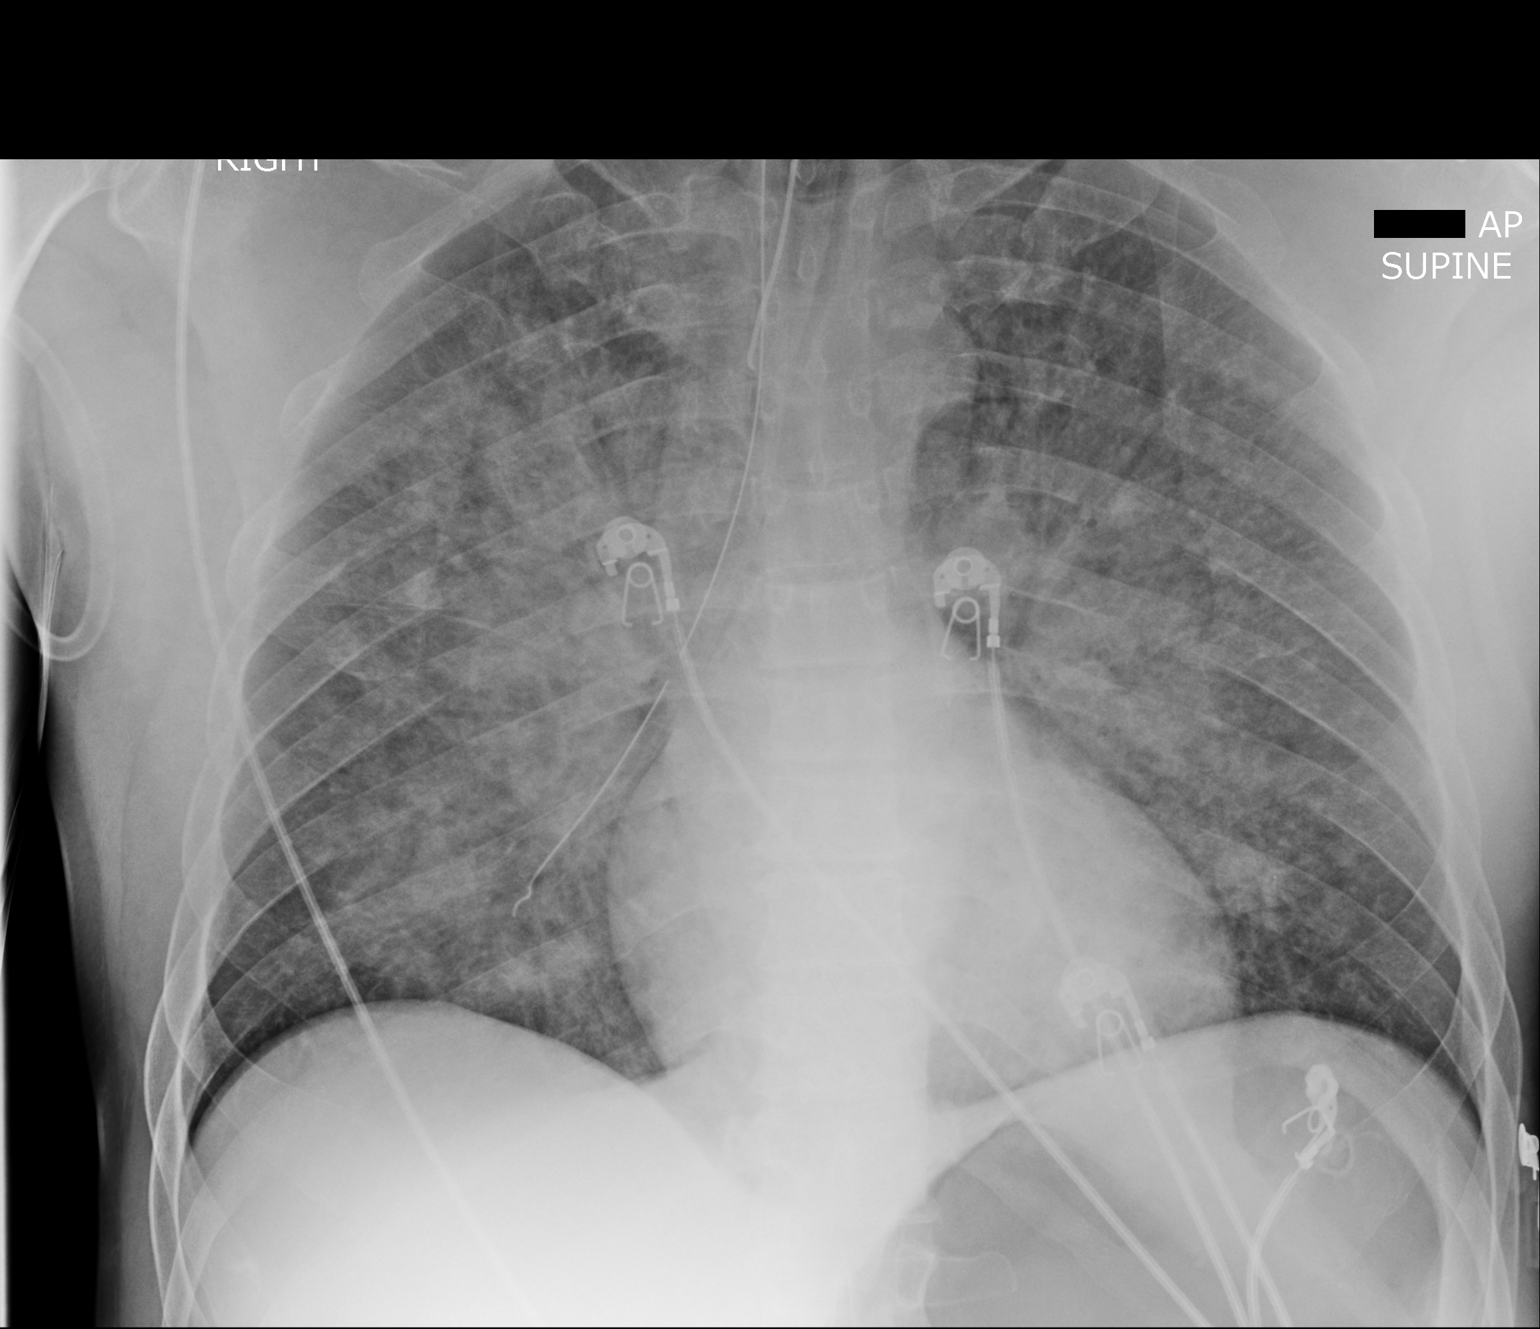

[1 of 1 positions shown; findings below may reference images not displayed]

FINDINGS: An endotracheal tube is present with tip measuring about 4.3 cm
above the carina. An enteric tube has been placed. The tip is in a
right lower lobe bronchus and needs to be repositioned. Normal heart
size. Diffuse patchy airspace disease throughout the lungs in a
perihilar distribution with peripheral sparing. This may represent
edema, pneumonia, or hemorrhagic consolidation. Aspiration could
also have this appearance in the appropriate setting. Prominent
stomach gas.
IMPRESSION: Endotracheal tube appears in satisfactory position. Enteric tube is
in a right lower lobe bronchus and needs to be repositioned. Diffuse
bilateral airspace infiltrates in a mostly perihilar distribution.

These results were called by telephone at the time of interpretation
on 12/21/2016 at [DATE] to Dr. ANOVUYO RADINGOANA , who verbally
acknowledged these results.
